# Patient Record
Sex: Female | Born: 1935 | Race: White | Hispanic: No | Smoking: Never smoker
Health system: Southern US, Community
[De-identification: ages and names within clinical notes are randomized; demographics above are authoritative.]

## PROBLEM LIST (undated history)

## (undated) DIAGNOSIS — Z9221 Personal history of antineoplastic chemotherapy: Secondary | ICD-10-CM

## (undated) DIAGNOSIS — E079 Disorder of thyroid, unspecified: Secondary | ICD-10-CM

## (undated) DIAGNOSIS — C801 Malignant (primary) neoplasm, unspecified: Secondary | ICD-10-CM

## (undated) DIAGNOSIS — Z974 Presence of external hearing-aid: Secondary | ICD-10-CM

## (undated) DIAGNOSIS — Z923 Personal history of irradiation: Secondary | ICD-10-CM

## (undated) DIAGNOSIS — E039 Hypothyroidism, unspecified: Secondary | ICD-10-CM

## (undated) HISTORY — DX: Disorder of thyroid, unspecified: E07.9

## (undated) HISTORY — PX: EYE SURGERY: SHX253

## (undated) HISTORY — PX: ELBOW FRACTURE SURGERY: SHX616

## (undated) HISTORY — PX: ABDOMINAL EXPLORATION SURGERY: SHX538

---

## 2004-05-27 ENCOUNTER — Ambulatory Visit: Payer: Self-pay | Admitting: Internal Medicine

## 2004-06-27 ENCOUNTER — Ambulatory Visit: Payer: Self-pay | Admitting: Internal Medicine

## 2004-07-21 ENCOUNTER — Ambulatory Visit: Payer: Self-pay | Admitting: Internal Medicine

## 2004-07-27 ENCOUNTER — Ambulatory Visit: Payer: Self-pay | Admitting: Internal Medicine

## 2004-09-07 ENCOUNTER — Ambulatory Visit: Payer: Self-pay | Admitting: Internal Medicine

## 2004-09-07 IMAGING — CT CT CHEST-ABD-PELV W/ CM
1 of 3 series · 14 of 30 positions shown, 18 images · non-contrast
Comparison: none

REASON FOR EXAM: abdominal lymphoma
COMMENTS:

[Series 3: inspace · axial · 0.66mm/px · z∈[-454,+98]mm · 14 of 612 slices shown, 18 images]
[im 30/612  mediastinal]
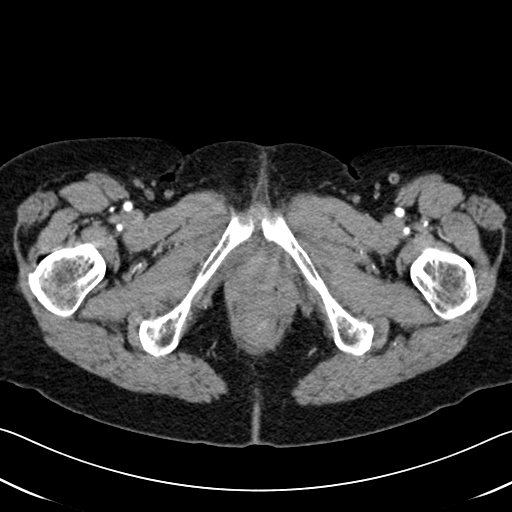
[im 30/612  bone]
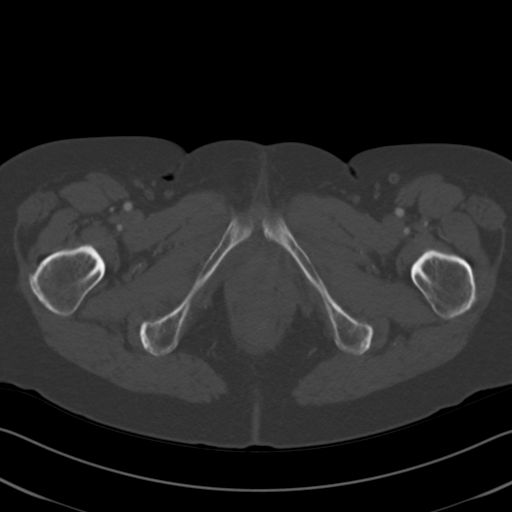
[im 88/612  mediastinal]
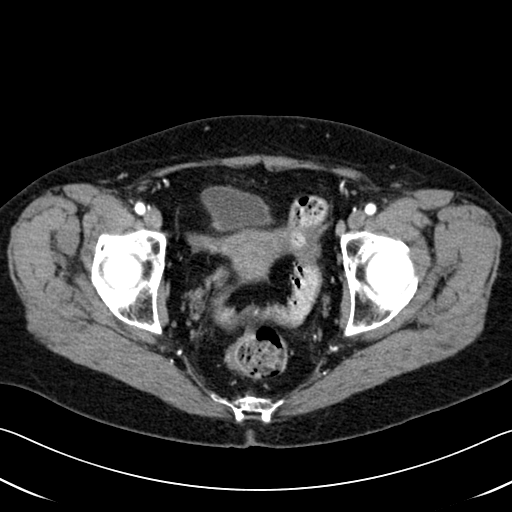
[im 146/612  mediastinal]
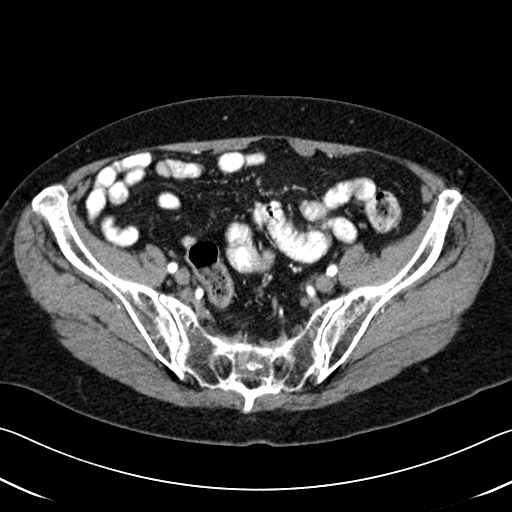
[im 204/612  mediastinal]
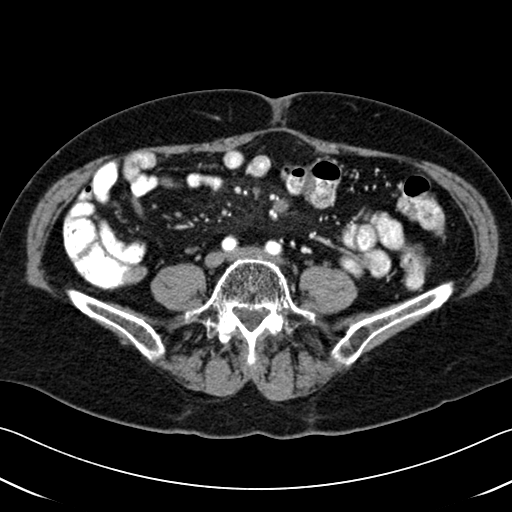
[im 233/612  mediastinal]
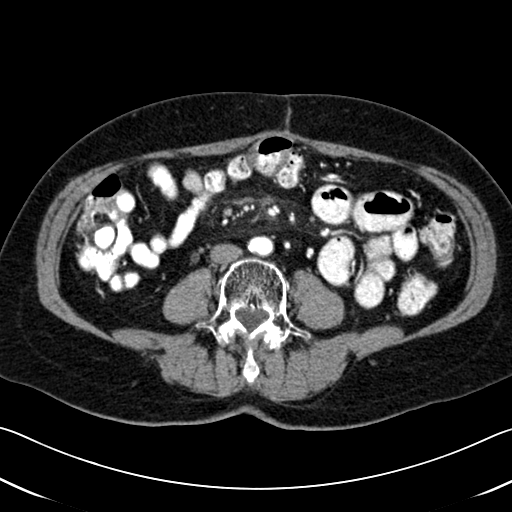
[im 291/612  mediastinal]
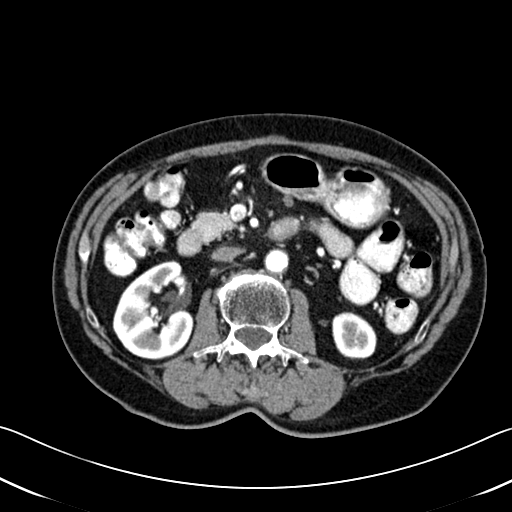
[im 321/612  mediastinal]
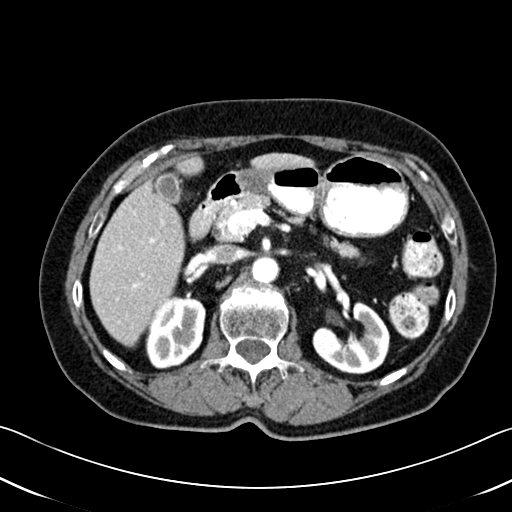
[im 379/612  mediastinal]
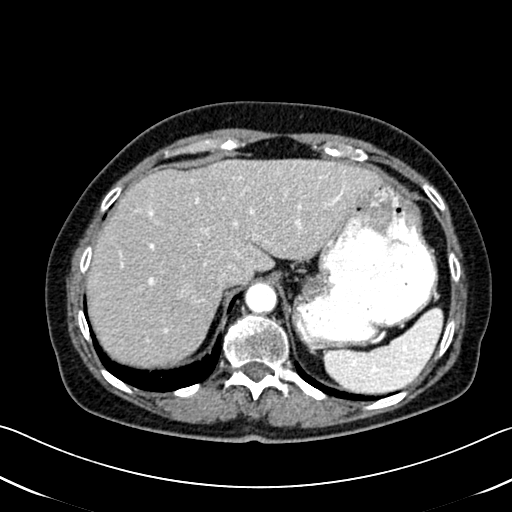
[im 408/612  mediastinal]
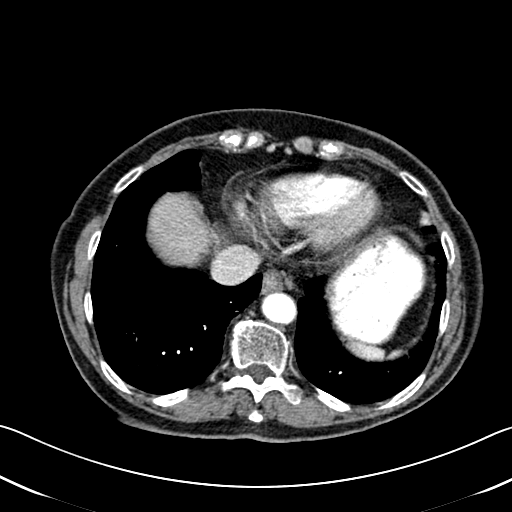
[im 408/612  bone]
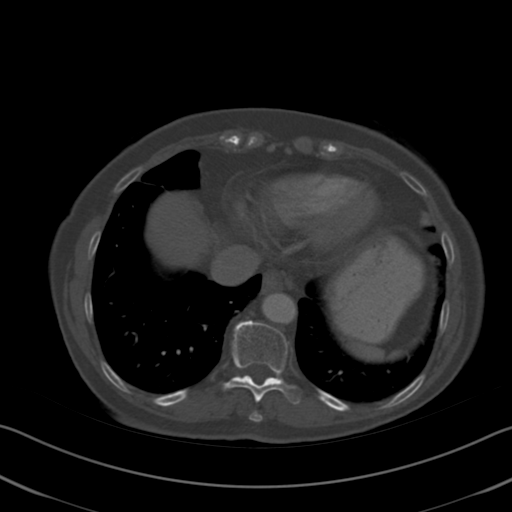
[im 466/612  mediastinal]
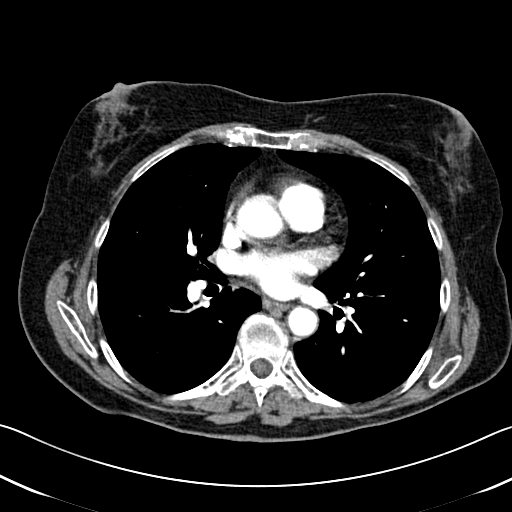
[im 495/612  lung]
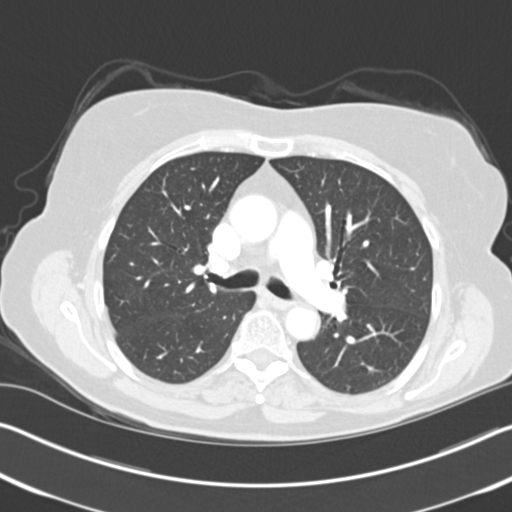
[im 524/612  mediastinal]
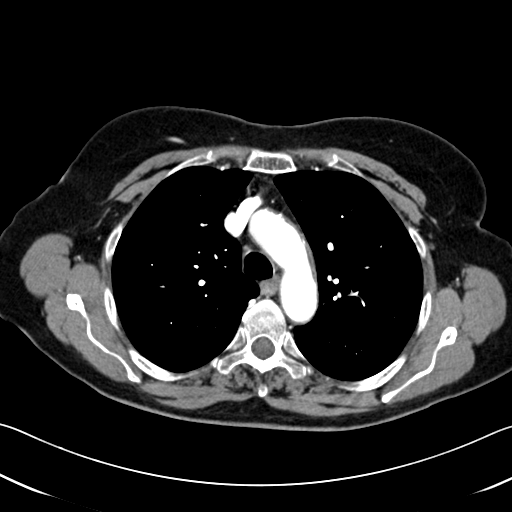
[im 524/612  lung]
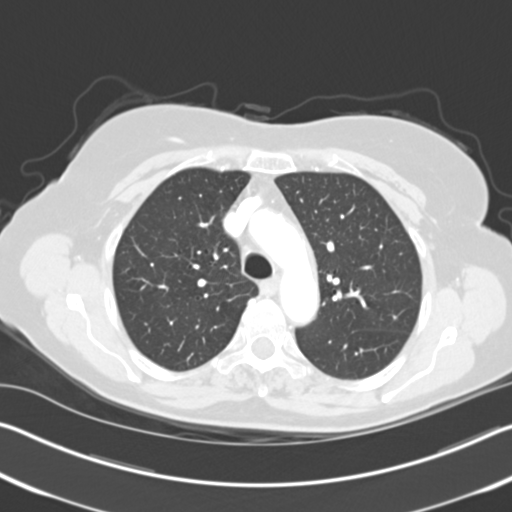
[im 553/612  lung]
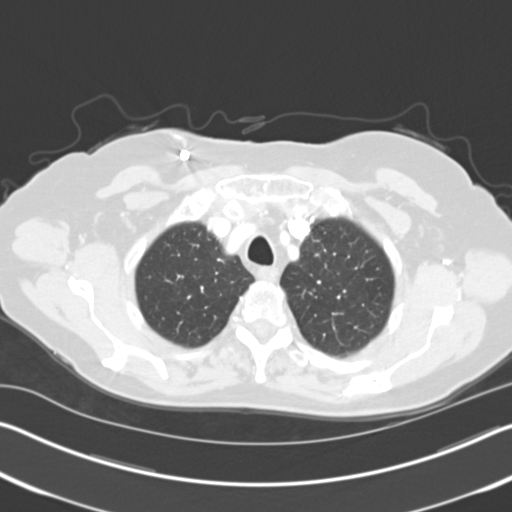
[im 582/612  mediastinal]
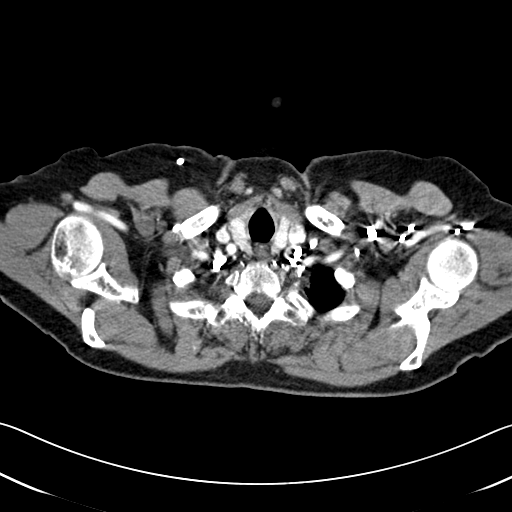
[im 582/612  lung]
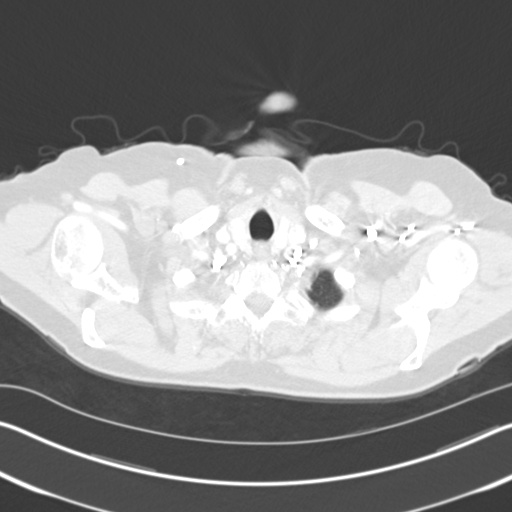

[14 of 30 positions shown; findings below may reference images not displayed]

PROCEDURE:     CT  - CT CHEST ABDOMEN AND PELVIS W  - [DATE] [DATE]

RESULT:        Comparison is made to a study dated [DATE].

The patient received 100 cc of [ND] for this study.  Within the
axillary regions, I see no lymphadenopathy.   The retrosternal soft tissues
are normal in appearance.  I see no pathologic size mediastinal nor hilar
lymph nodes.  The cardiac chambers are not enlarged.  The caliber of the
thoracic aorta is normal.  I see no pleural nor pericardial effusion.  There
is a subtle area of soft tissue density in the RIGHT cardiophrenic angle
seen best on image #38 of the lung windows and #37 of the mediastinal
windows that appears new and may reflect an area of subsegmental
atelectasis.  The lung parenchyma otherwise exhibits no acute abnormality.

CT ABDOMEN AND PELVIS

The patient received the aforementioned IV contrast as well as oral
contrast.

The liver, partially distended stomach, pancreas, adrenal glands,
gallbladder and spleen are normal in appearance.  The pancreatic duct is
visible which may indicate some atrophy but this not a new finding.  No
pancreatic mass is identified.  The para-aortic and pericaval regions
exhibit no evidence of lymphadenopathy.   The kidneys enhance well and
exhibit no hydronephrosis.  The partially contrast filled loops of small and
large bowel are grossly normal.  I see no free fluid in the pelvis.  The
uterus and adnexal structures appear normal for age.  The partially
distended urinary bladder is normal in appearance.  On the prior study,
comment was made regarding the slightly increased density seen in the
mesenteric fat inferior to the pancreas.   This is again seen but appears
unchanged.
IMPRESSION: 1.     I see no mediastinal or hilar lymphadenopathy.
2.     There is patchy density extremely anteriorly and medially in the
RIGHT middle lobe near the cardiophrenic angle that is new and may reflect
an area of subsegmental atelectasis.  This could be monitored with chest
x-rays.
3.     I do not see findings suspicious for active lymphoma within the
abdomen or pelvis.  No other acute intra-abdominal abnormality is
identified.

## 2004-09-13 ENCOUNTER — Ambulatory Visit: Payer: Self-pay | Admitting: Surgery

## 2004-09-27 ENCOUNTER — Ambulatory Visit: Payer: Self-pay | Admitting: Internal Medicine

## 2004-11-01 ENCOUNTER — Ambulatory Visit: Payer: Self-pay | Admitting: Internal Medicine

## 2004-11-25 ENCOUNTER — Ambulatory Visit: Payer: Self-pay | Admitting: Internal Medicine

## 2005-01-10 ENCOUNTER — Ambulatory Visit: Payer: Self-pay | Admitting: Radiation Oncology

## 2005-01-15 IMAGING — CT CT CHEST-ABD-PELV W/ CM
1 of 2 series · 14 of 29 positions shown, 19 images · non-contrast
Comparison: none

REASON FOR EXAM: Lymphoma followup
COMMENTS:

[Series 2: soft tissue · axial · 0.62mm/px · z∈[-656,-130]mm · 14 of 119 slices shown, 19 images]
[im 7/119  mediastinal]
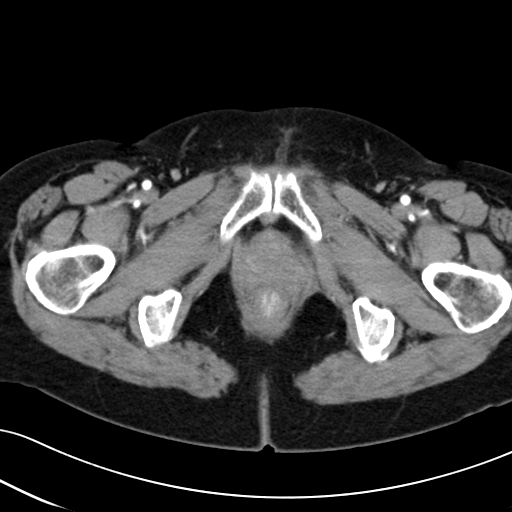
[im 7/119  bone]
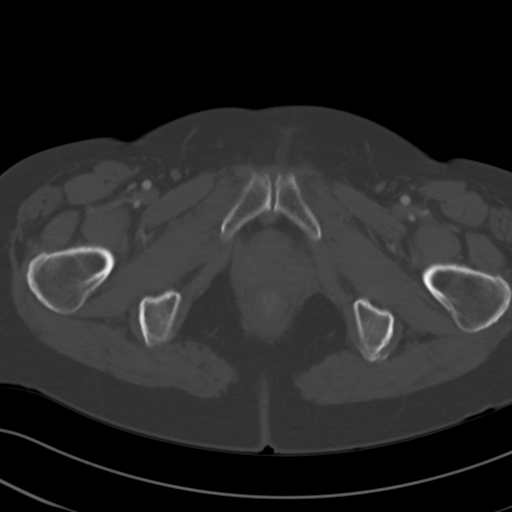
[im 20/119  mediastinal]
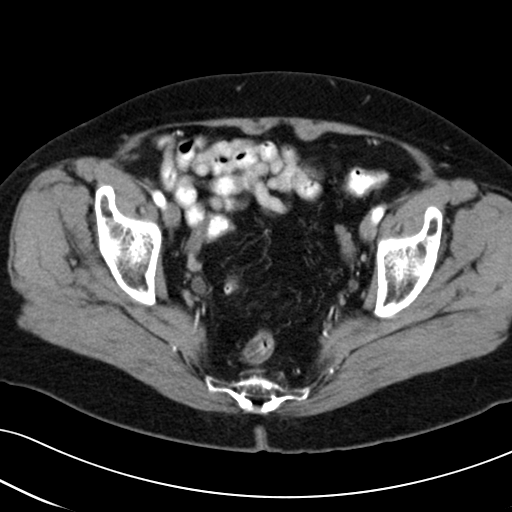
[im 27/119  mediastinal]
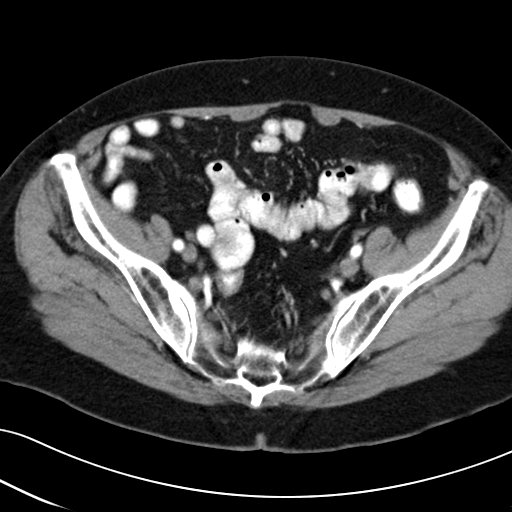
[im 33/119  mediastinal]
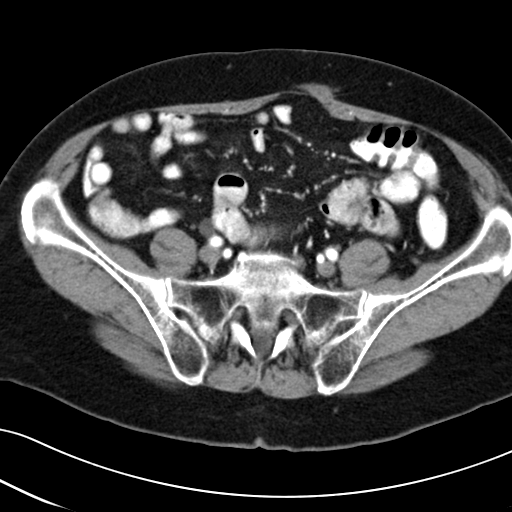
[im 46/119  mediastinal]
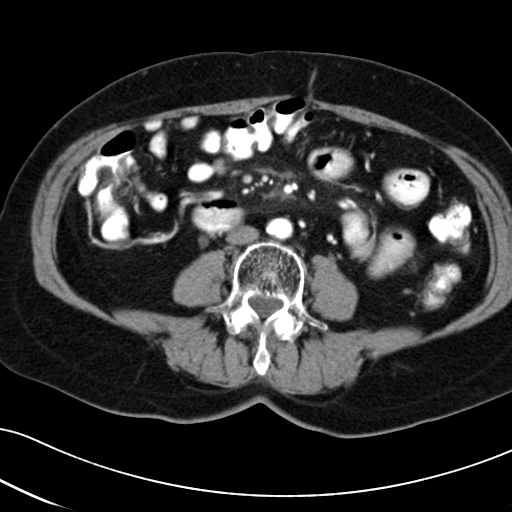
[im 53/119  mediastinal]
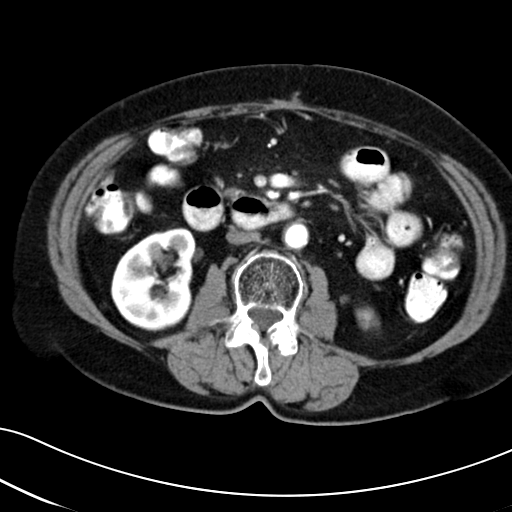
[im 60/119  mediastinal]
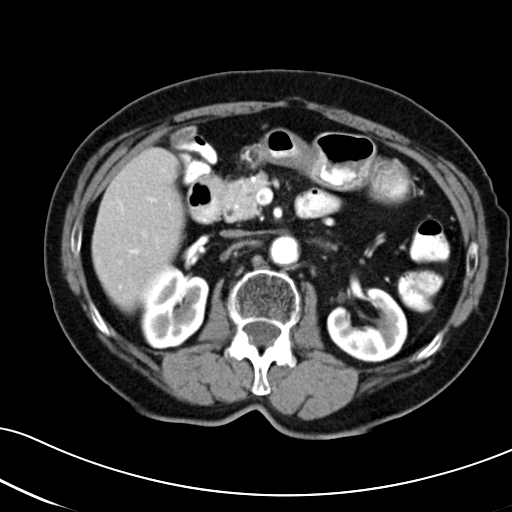
[im 66/119  mediastinal]
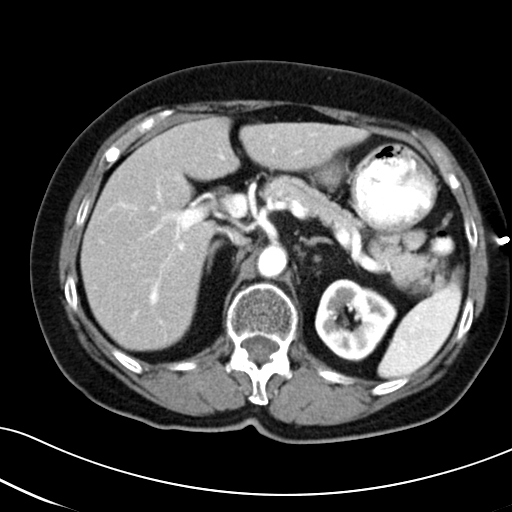
[im 73/119  mediastinal]
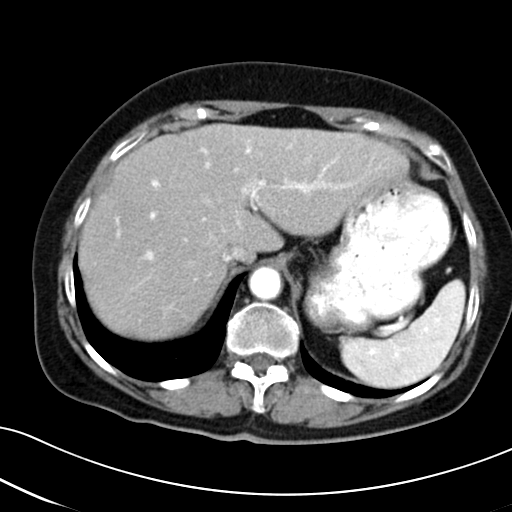
[im 73/119  bone]
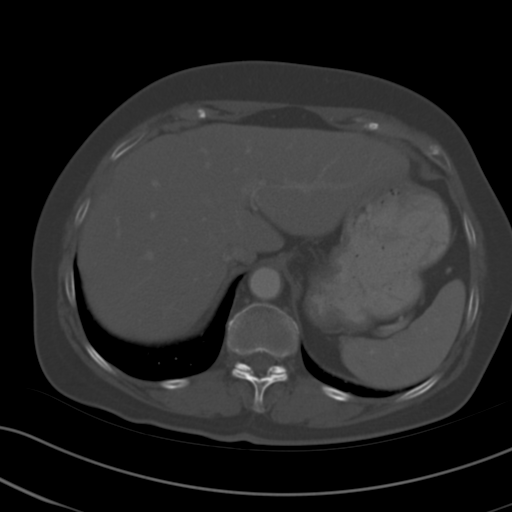
[im 86/119  mediastinal]
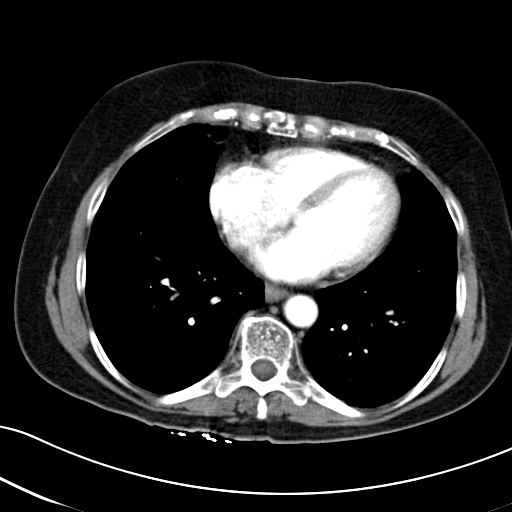
[im 92/119  mediastinal]
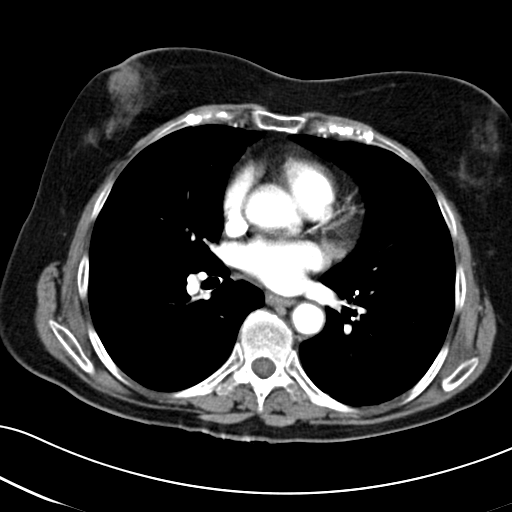
[im 92/119  lung]
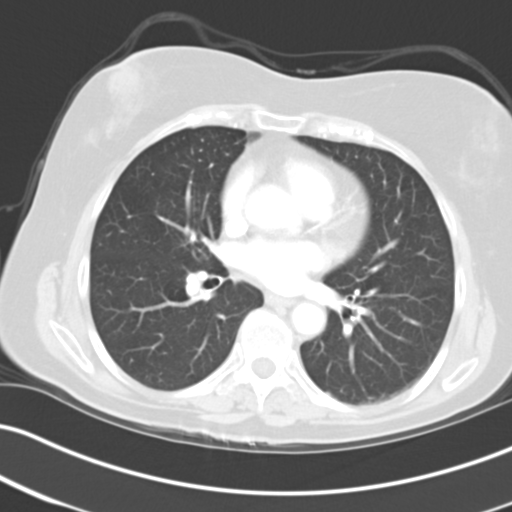
[im 99/119  mediastinal]
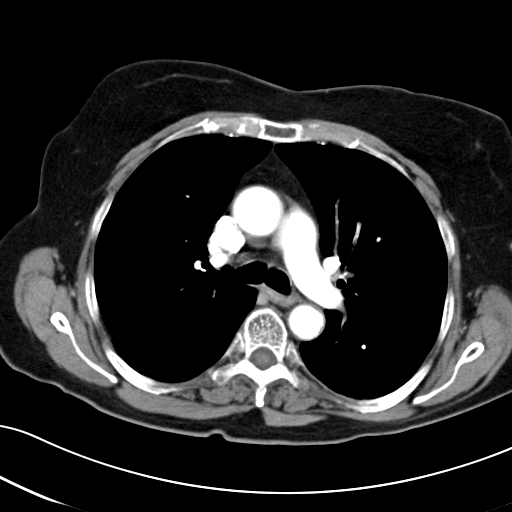
[im 99/119  lung]
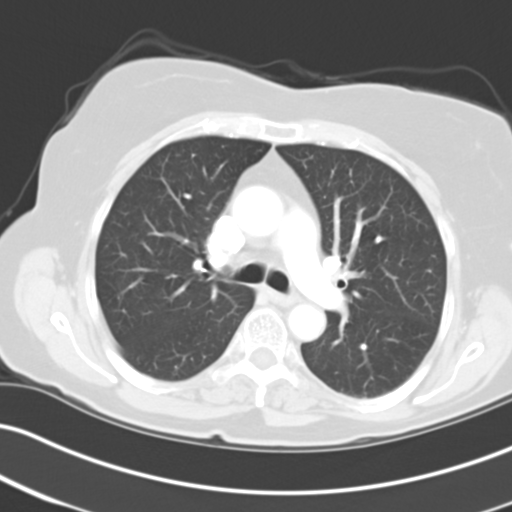
[im 105/119  lung]
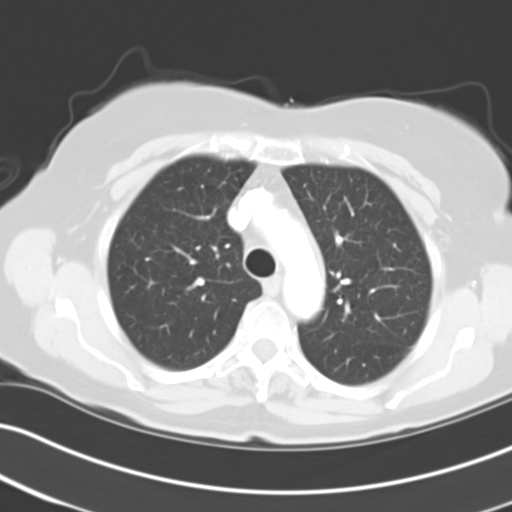
[im 112/119  mediastinal]
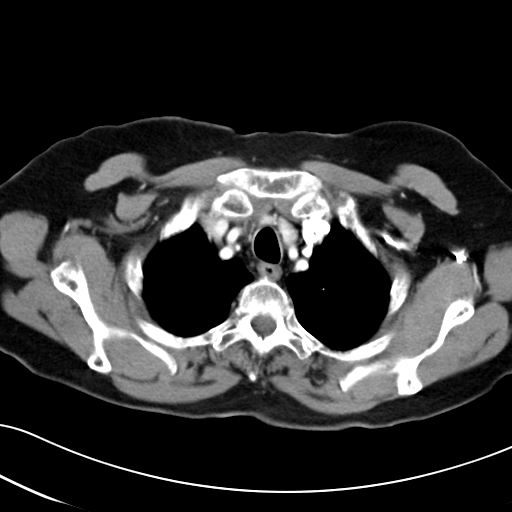
[im 112/119  lung]
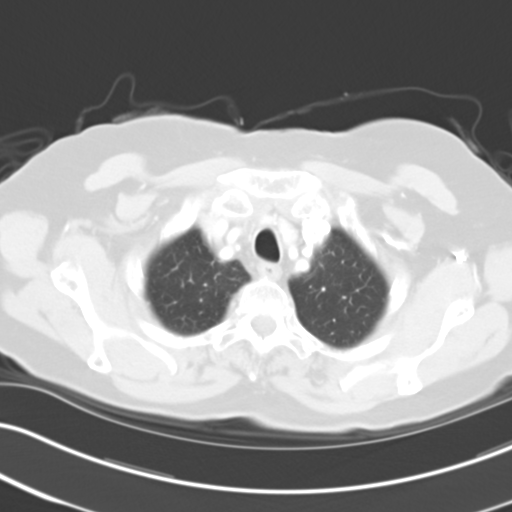

[14 of 29 positions shown; findings below may reference images not displayed]

PROCEDURE:     CT  - CT CHEST ABDOMEN AND PELVIS W  - [DATE]  [DATE]

RESULT:     Comparison is made to the prior CT scan performed on
[DATE].     There is no evidence of mediastinal or hilar mass or
adenopathy.  No axillary masses or areas of adenopathy are present.  The
heart is not enlarged.  There is no pleural effusion.  There is no evidence
of focal pulmonary parenchymal mass or nodularity.  Some linear density is
seen in the RIGHT middle lobe near the lung base suggestive of subsegmental
atelectasis or fibrosis.

Within the abdomen the spleen shows no focal masses or evidence of
enlargement.  The liver shows normal attenuation and enhancement.  There is
no evidence of adenopathy.  The abdominal aorta is normal in caliber.  No
retroperitoneal adenopathy is seen.  The kidneys are normal in size and
enhancement.  The pancreas is normal.  The gallbladder is nondistended.  No
abnormal bowel distention or bowel wall thickening is present.  No inguinal
or pelvic adenopathy is seen.
IMPRESSION: No evidence of adenopathy to suggest residual or recurrent disease.

Minimal linear density in the RIGHT middle lobe suggestive of atelectasis
versus fibrosis.

## 2005-01-25 ENCOUNTER — Ambulatory Visit: Payer: Self-pay | Admitting: Radiation Oncology

## 2005-02-24 ENCOUNTER — Ambulatory Visit: Payer: Self-pay | Admitting: Radiation Oncology

## 2005-05-30 ENCOUNTER — Ambulatory Visit: Payer: Self-pay | Admitting: Internal Medicine

## 2005-06-27 ENCOUNTER — Ambulatory Visit: Payer: Self-pay | Admitting: Internal Medicine

## 2005-07-24 IMAGING — CT CT CHEST-ABD-PELV W/ CM
1 of 2 series · 14 of 29 positions shown, 19 images · non-contrast
Comparison: none

REASON FOR EXAM: History of lymphoma, assess for recurrence
COMMENTS:

[Series 2: soft tissue · axial · 0.68mm/px · z∈[-94,+430]mm · 14 of 119 slices shown, 19 images]
[im 7/119  mediastinal]
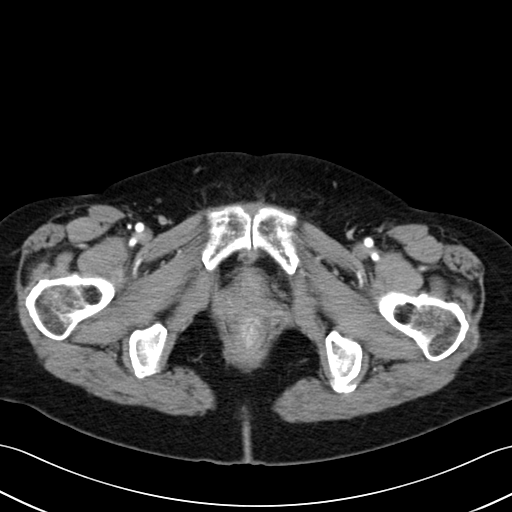
[im 7/119  bone]
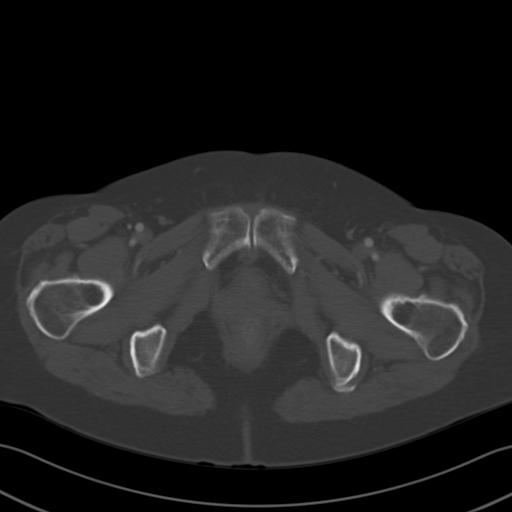
[im 20/119  mediastinal]
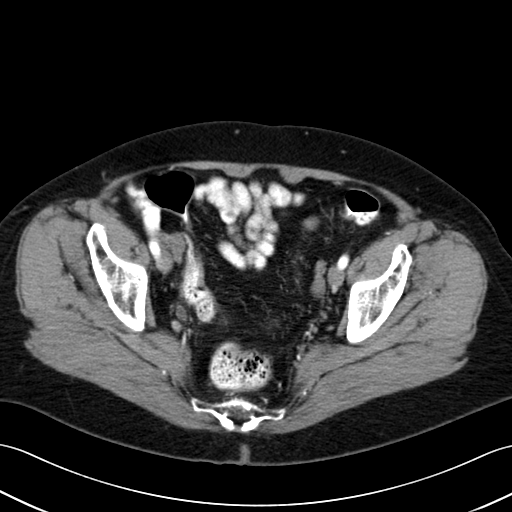
[im 27/119  mediastinal]
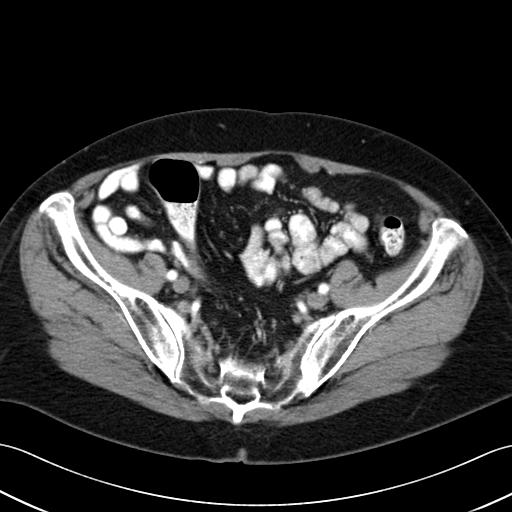
[im 33/119  mediastinal]
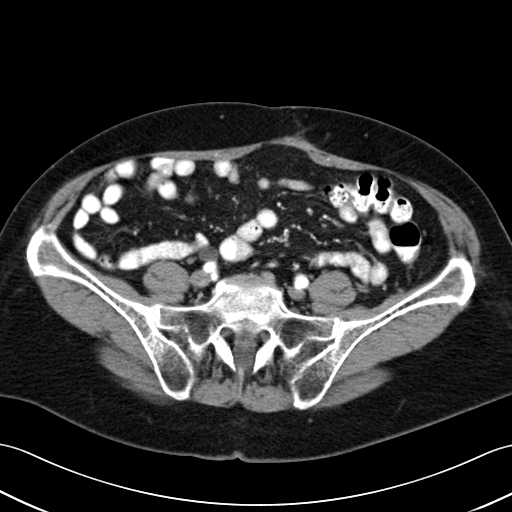
[im 46/119  mediastinal]
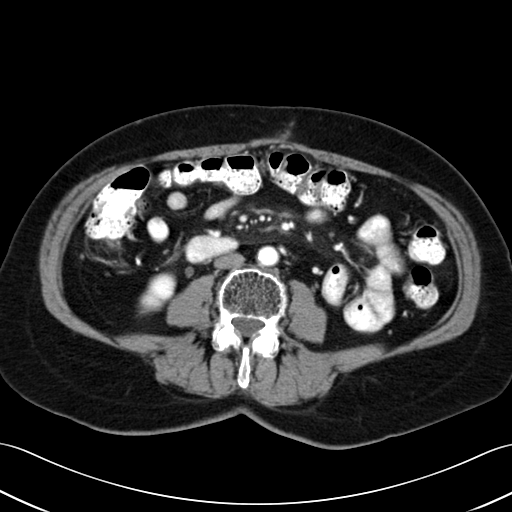
[im 53/119  mediastinal]
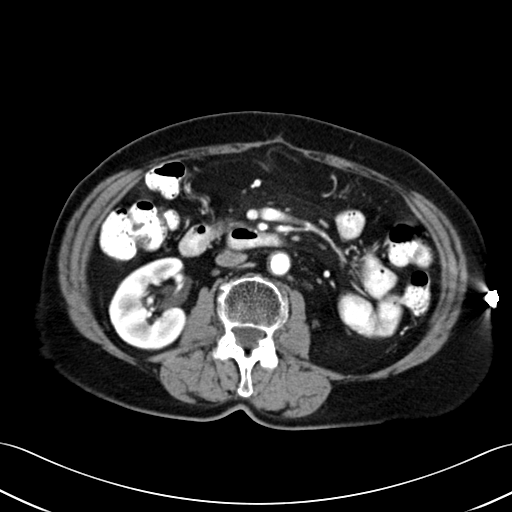
[im 60/119  mediastinal]
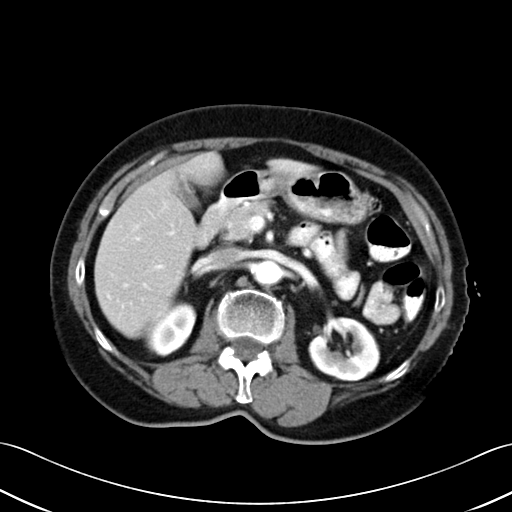
[im 66/119  mediastinal]
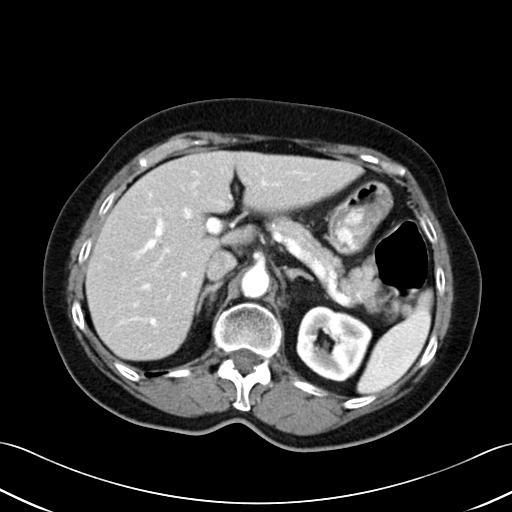
[im 73/119  mediastinal]
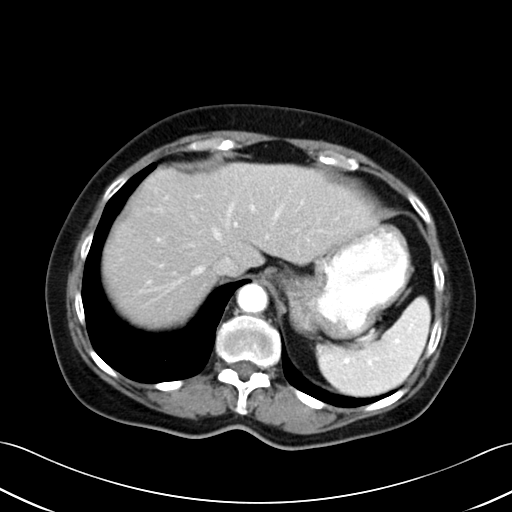
[im 73/119  bone]
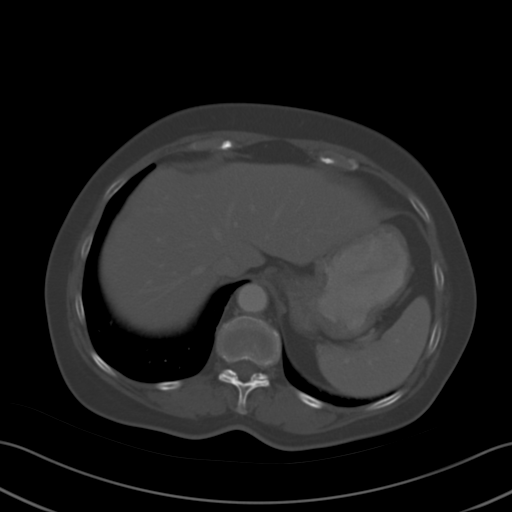
[im 86/119  mediastinal]
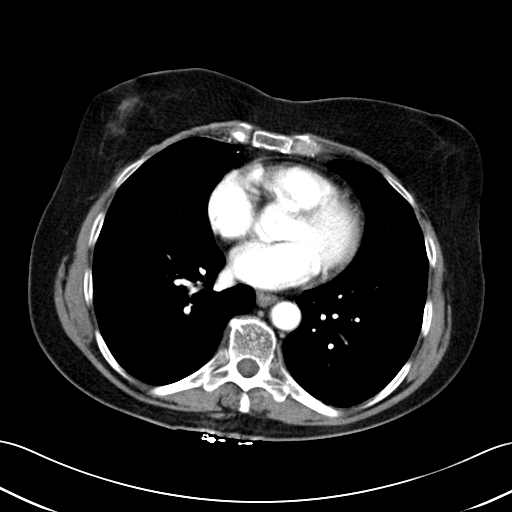
[im 92/119  mediastinal]
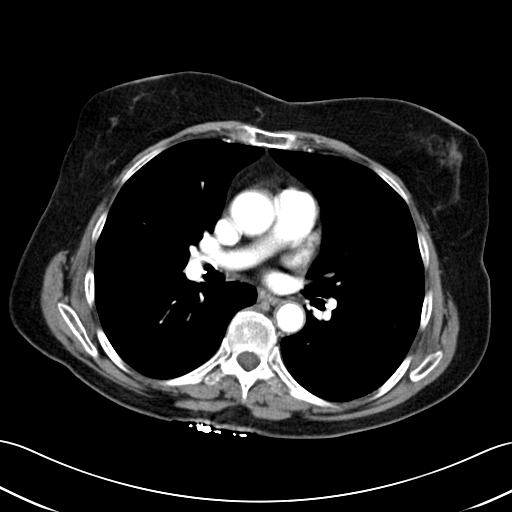
[im 92/119  lung]
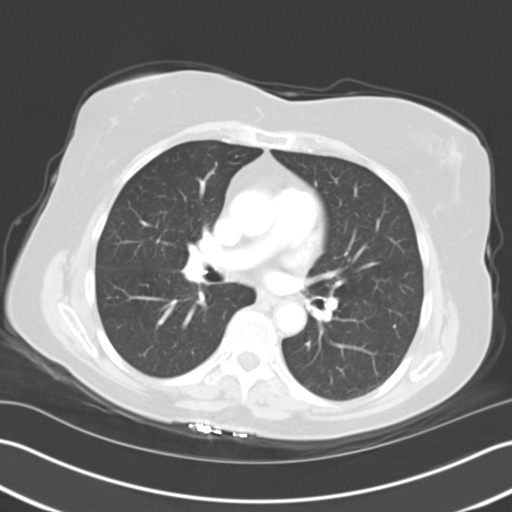
[im 99/119  mediastinal]
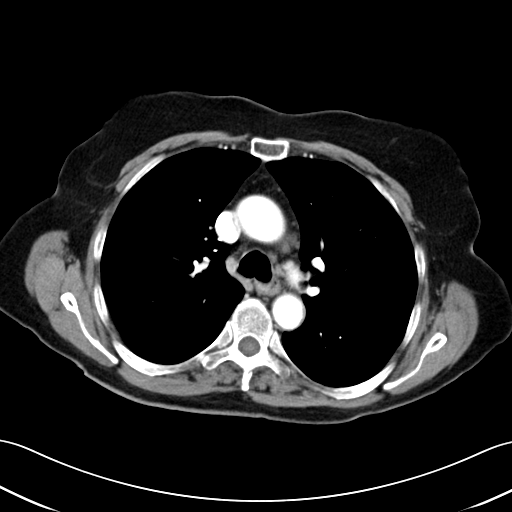
[im 99/119  lung]
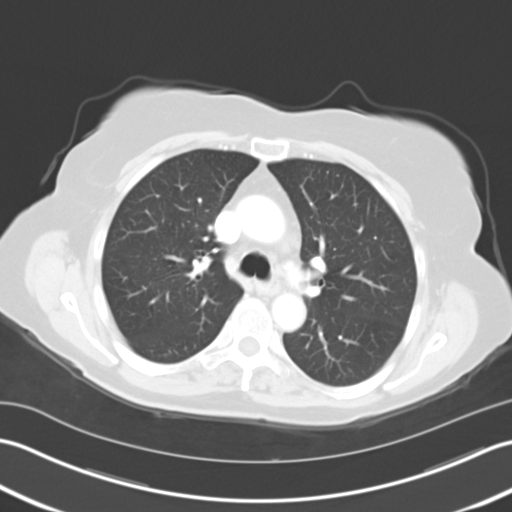
[im 105/119  lung]
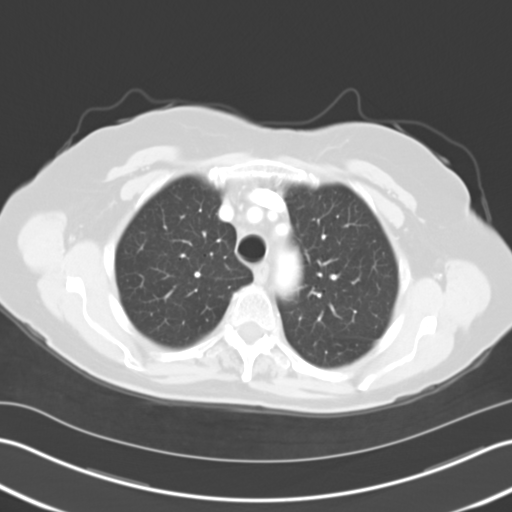
[im 112/119  mediastinal]
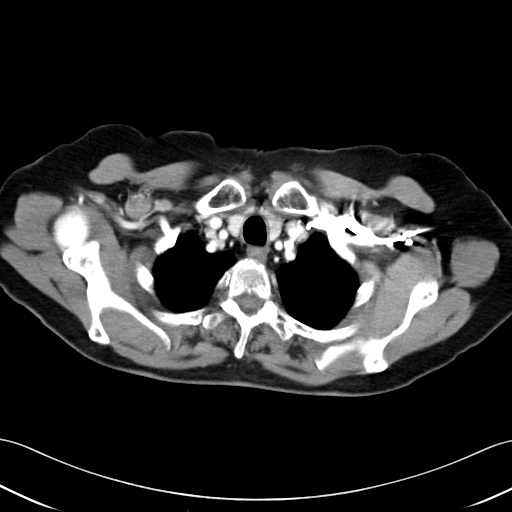
[im 112/119  lung]
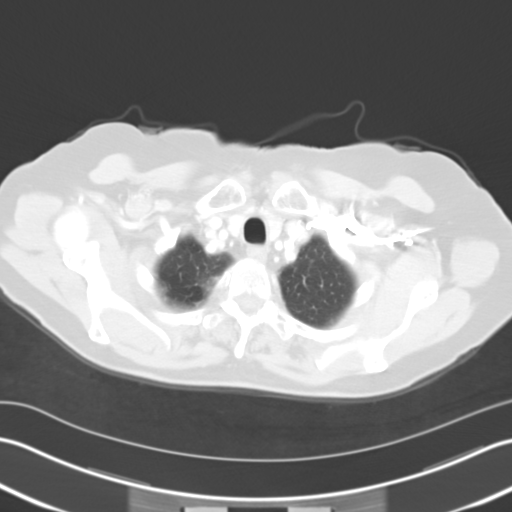

[14 of 29 positions shown; findings below may reference images not displayed]

PROCEDURE:     CT  - CT CHEST ABDOMEN AND PELVIS W  - [DATE] [DATE]

RESULT:     Axial images were obtained from the apices to the pubic
symphysis post intravenous and oral administration of contrast material.
The present study is compared with prior exam of [DATE].

CHEST CT:  There are a few tiny lymph nodes within the aorticopulmonary
window as well as in the pretracheal area.  No hilar and/or subcarinal
adenopathy is noted.  On the lung window settings there appears some linear
fibrosis in the middle and lingular regions.  No effusions are seen.  No
nodularity in the lung fields.

ABDOMINAL/PELVIC CT:  The liver and spleen appear intact.  No organomegaly
is noted.  Both kidneys excrete the contrast material.  No masses are
identified in either kidney.  No hydronephrosis is seen.  No retroperitoneal
adenopathy is seen.  No mesenteric adenopathy is noted.  Within the pelvis
no obvious adenopathy is noted.  No fluid is noted in the abdomen and/or
pelvis.
IMPRESSION: No definite evidence of recurrence of lymphoma on the chest CT.

No definite evidence of recurrent lymphoma identified in the abdomen or
pelvis.

## 2005-07-27 ENCOUNTER — Ambulatory Visit: Payer: Self-pay | Admitting: Internal Medicine

## 2005-08-23 ENCOUNTER — Ambulatory Visit: Payer: Self-pay | Admitting: Internal Medicine

## 2005-11-26 ENCOUNTER — Ambulatory Visit: Payer: Self-pay | Admitting: Internal Medicine

## 2005-12-25 ENCOUNTER — Ambulatory Visit: Payer: Self-pay | Admitting: Internal Medicine

## 2006-03-12 ENCOUNTER — Ambulatory Visit: Payer: Self-pay | Admitting: Internal Medicine

## 2006-03-12 IMAGING — CT CT CHEST-ABD-PELV W/ CM
1 of 2 series · 14 of 29 positions shown, 19 images · IV contrast (agent unspecified)
Comparison: none

REASON FOR EXAM: Stage II lymphoma, status post treatment, evaluate for
recurrence
COMMENTS:

PROCEDURE:     CT  - CT CHEST ABDOMEN AND PELVIS W  - [DATE] [DATE]
RESULT:     The patient has a history of lymphoma, evaluate for recurrence.
TECHNIQUE: CT scan of chest, abdomen and pelvis is performed with contrast.

[Series 2: soft tissue · axial · 0.74mm/px · z∈[-36,+484]mm · 14 of 118 slices shown, 19 images]
[im 7/118  mediastinal]
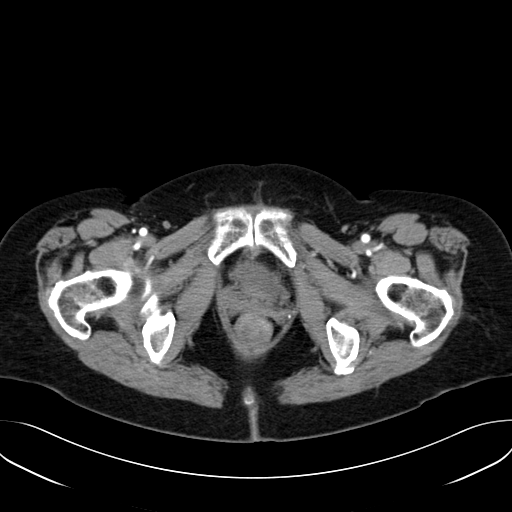
[im 7/118  bone]
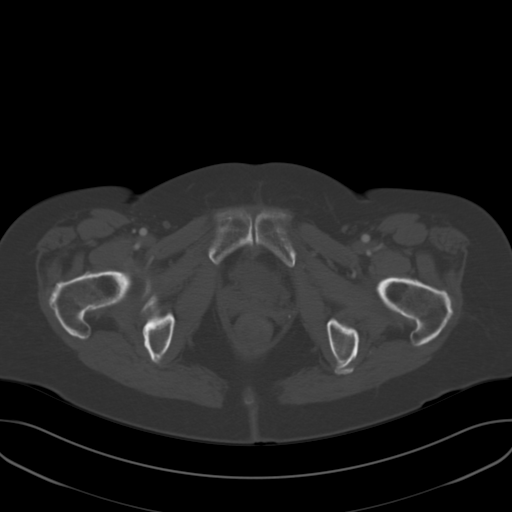
[im 20/118  mediastinal]
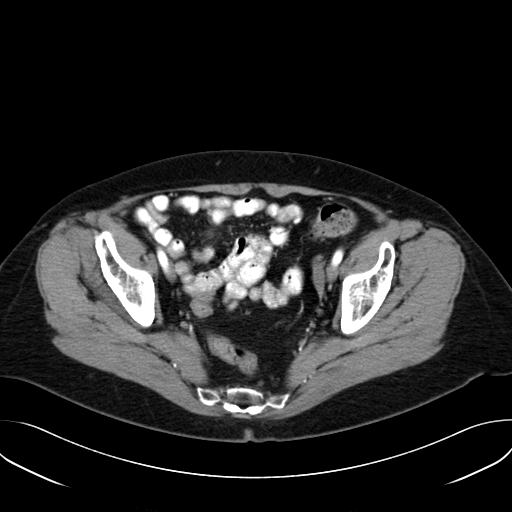
[im 27/118  mediastinal]
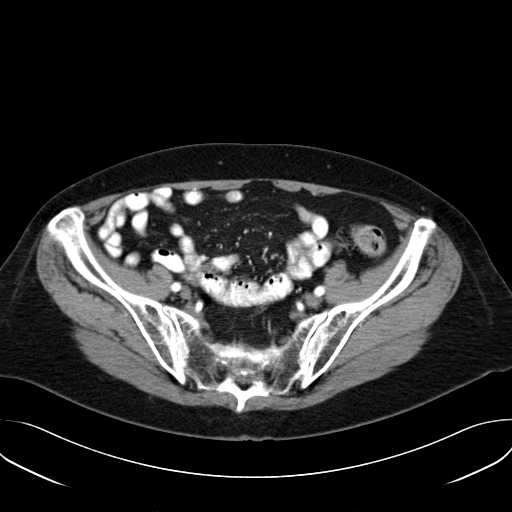
[im 33/118  mediastinal]
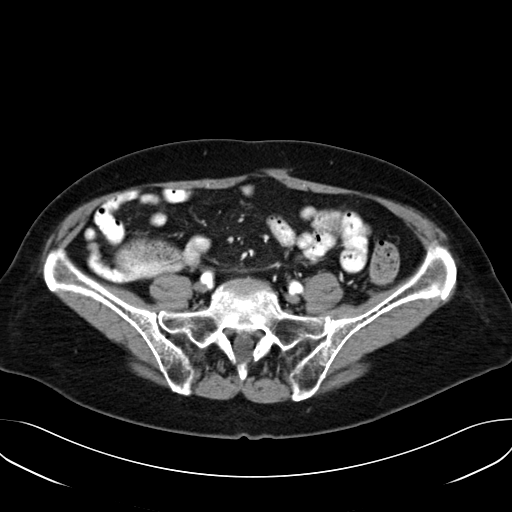
[im 46/118  mediastinal]
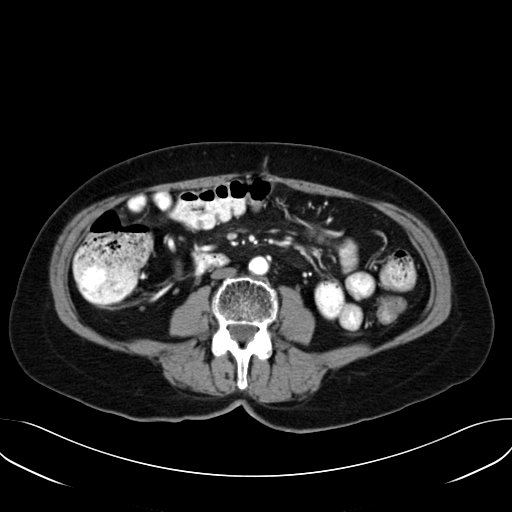
[im 53/118  mediastinal]
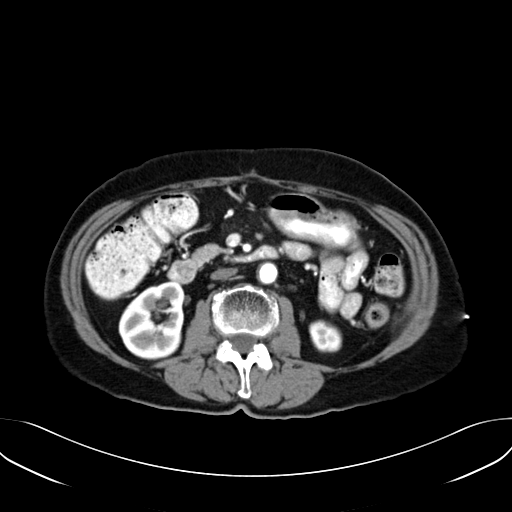
[im 59/118  mediastinal]
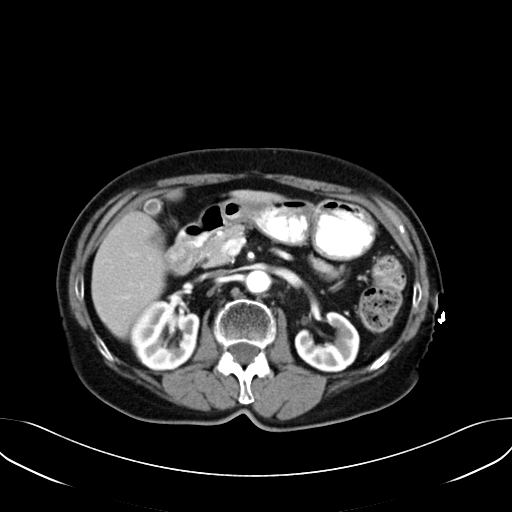
[im 66/118  mediastinal]
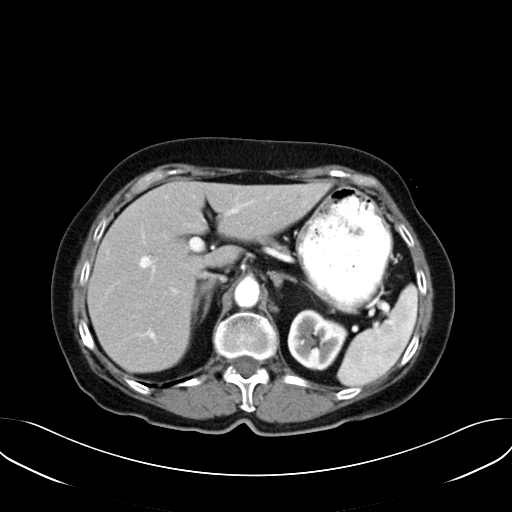
[im 72/118  mediastinal]
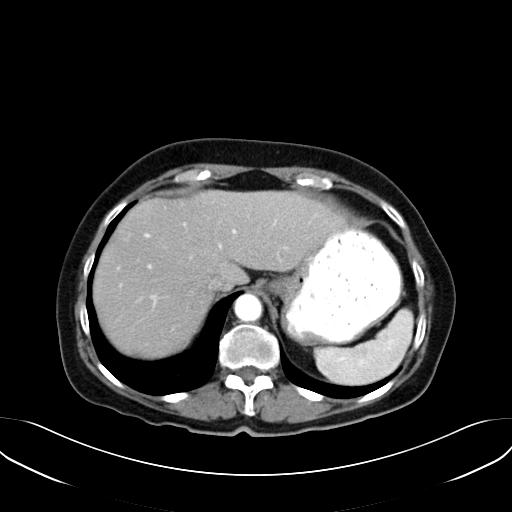
[im 72/118  bone]
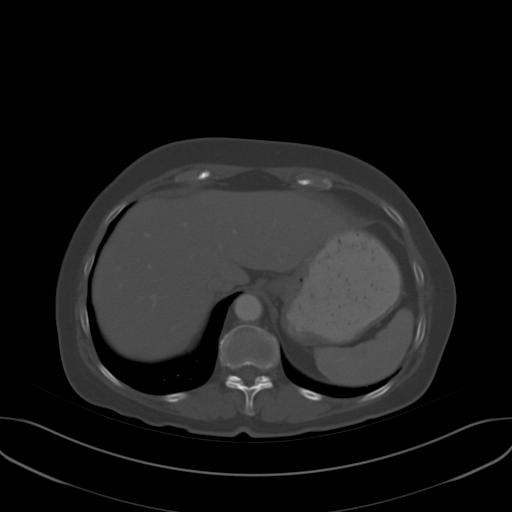
[im 85/118  mediastinal]
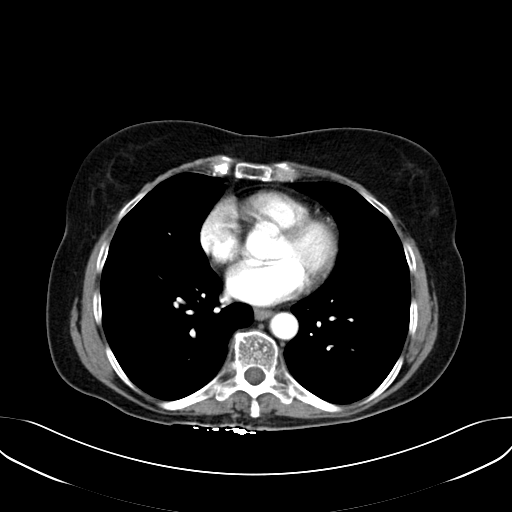
[im 92/118  mediastinal]
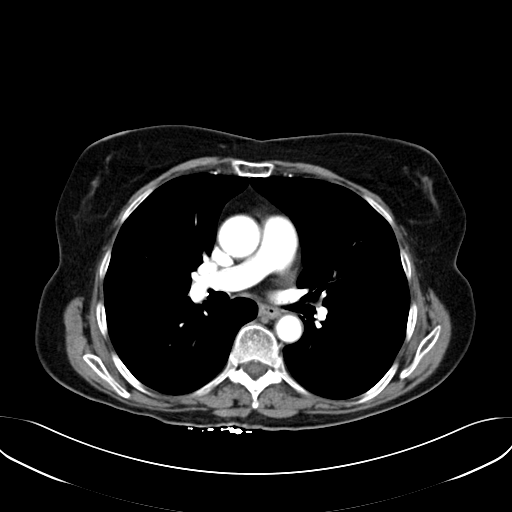
[im 92/118  lung]
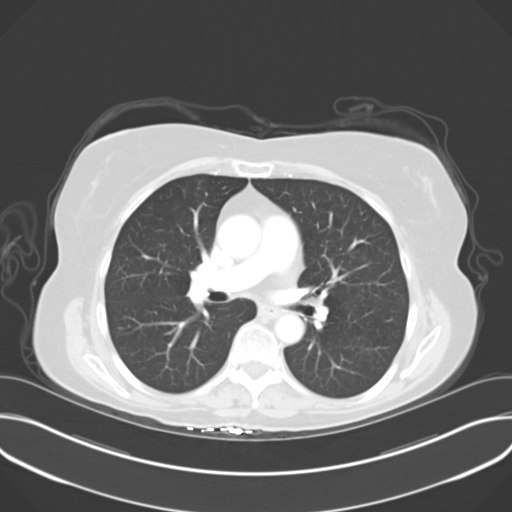
[im 98/118  mediastinal]
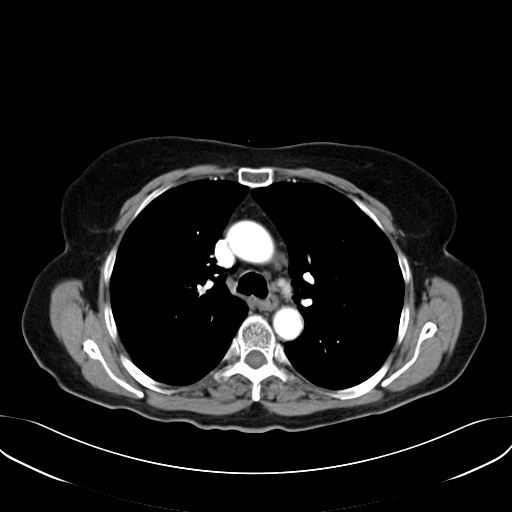
[im 98/118  lung]
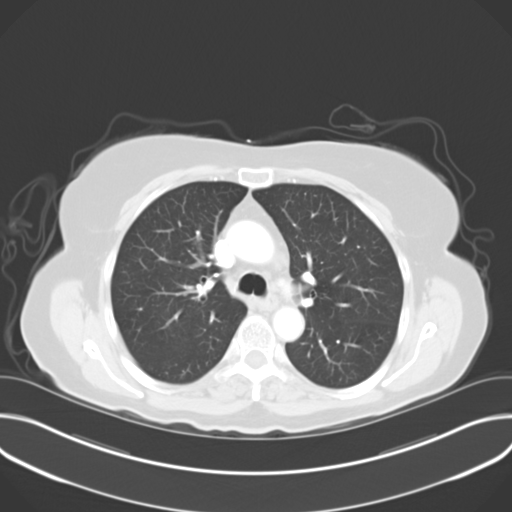
[im 105/118  lung]
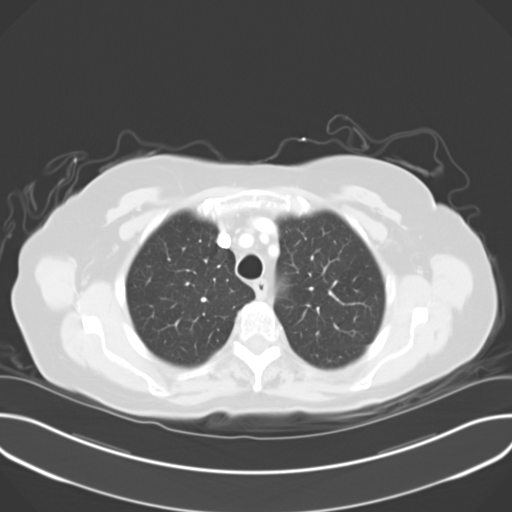
[im 111/118  mediastinal]
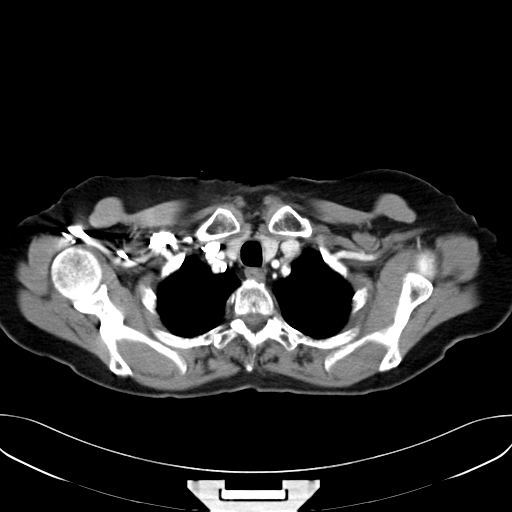
[im 111/118  lung]
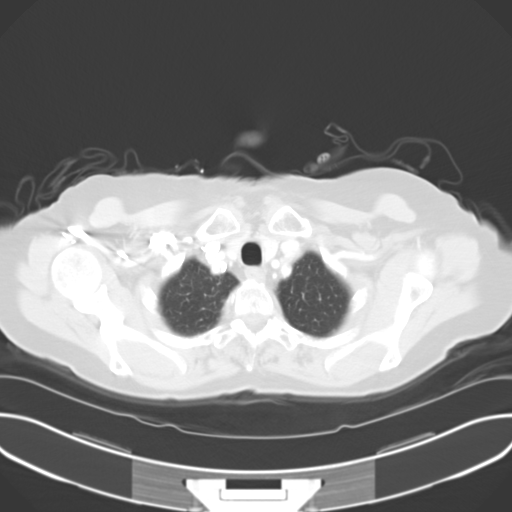

[14 of 29 positions shown; findings below may reference images not displayed]

FINDINGS: The mediastinum and hilar structures are normal. The large airways
are patent. No focal infiltrates are noted. No evidence of pulmonary
metastatic lesions is noted. The pulmonary arteries are normal. The adrenals
are normal. The liver and spleen are normal. No retroperitoneal adenopathy
is noted. The bowel is nondistended. Aortoiliac atherosclerotic vascular
disease is present. Small, inguinal lymph nodes are noted.
IMPRESSION: The CT of chest, abdomen and pelvis is stable from prior study
of [DATE]. There is no evidence of significant lymphadenopathy. PET/CT
can be obtained to demonstrated stability as well.

## 2006-03-27 ENCOUNTER — Ambulatory Visit: Payer: Self-pay | Admitting: Internal Medicine

## 2006-05-27 ENCOUNTER — Ambulatory Visit: Payer: Self-pay | Admitting: Internal Medicine

## 2006-06-27 ENCOUNTER — Ambulatory Visit: Payer: Self-pay | Admitting: Internal Medicine

## 2006-08-29 ENCOUNTER — Ambulatory Visit: Payer: Self-pay | Admitting: Internal Medicine

## 2006-11-22 ENCOUNTER — Ambulatory Visit: Payer: Self-pay | Admitting: Internal Medicine

## 2006-11-26 ENCOUNTER — Ambulatory Visit: Payer: Self-pay | Admitting: Internal Medicine

## 2007-02-25 ENCOUNTER — Ambulatory Visit: Payer: Self-pay | Admitting: Internal Medicine

## 2007-03-17 ENCOUNTER — Ambulatory Visit: Payer: Self-pay | Admitting: Internal Medicine

## 2007-03-17 IMAGING — CT CT CHEST-ABD-PELV W/ CM
1 of 2 series · 14 of 32 positions shown, 19 images · non-contrast
Comparison: none

REASON FOR EXAM: Lymphoma
COMMENTS:

[Series 2: soft tissue · axial · 0.71mm/px · z∈[-300,+240]mm · 14 of 120 slices shown, 19 images]
[im 6/120  soft-tissue]
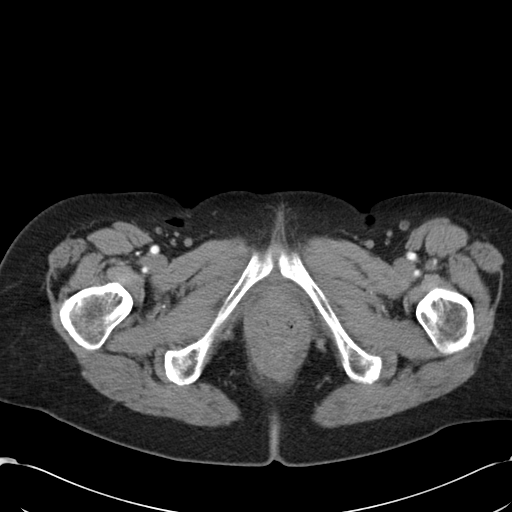
[im 6/120  bone]
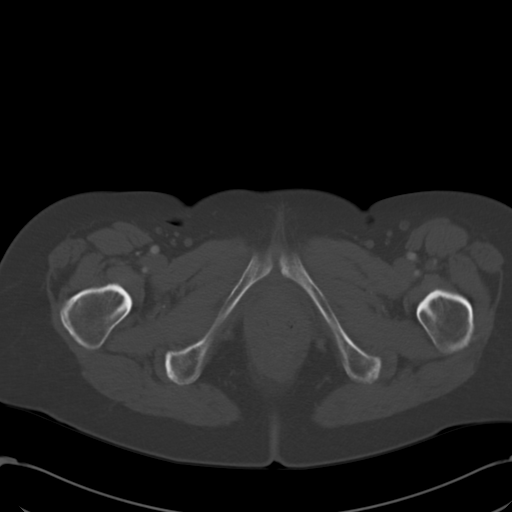
[im 18/120  soft-tissue]
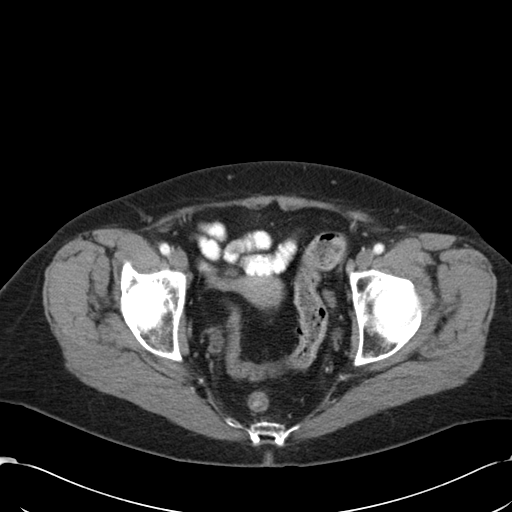
[im 23/120  soft-tissue]
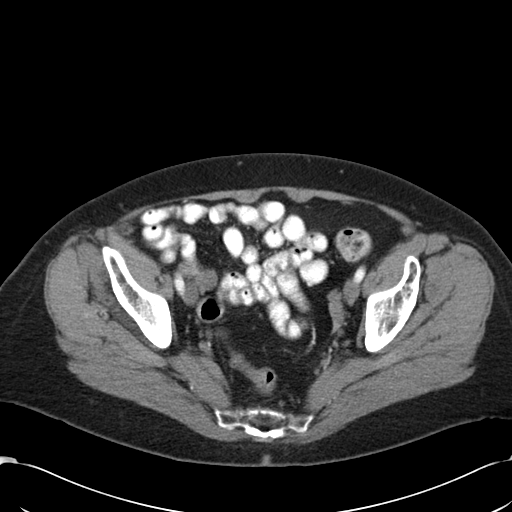
[im 35/120  soft-tissue]
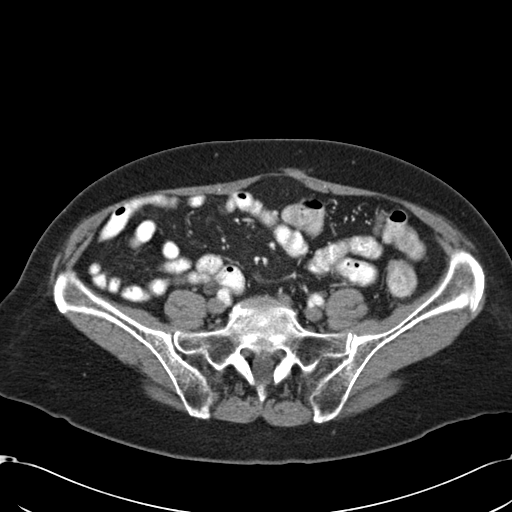
[im 40/120  soft-tissue]
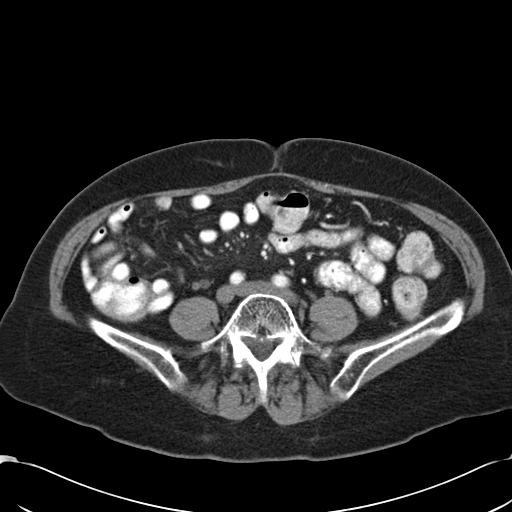
[im 52/120  soft-tissue]
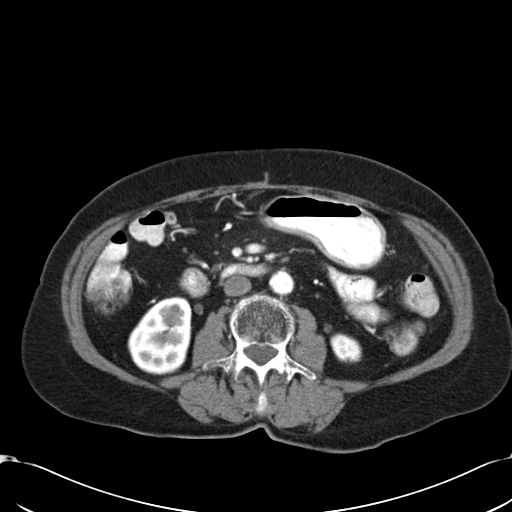
[im 63/120  soft-tissue]
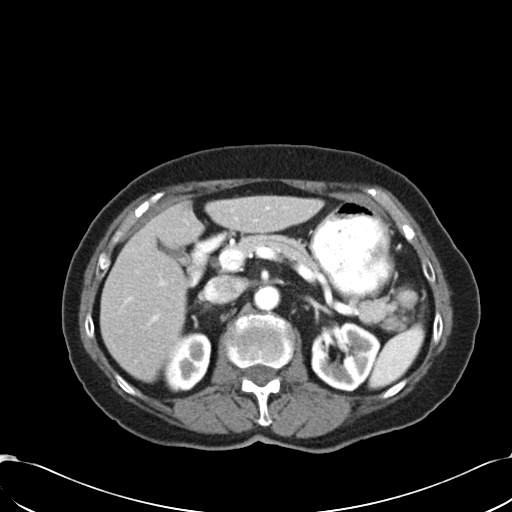
[im 69/120  soft-tissue]
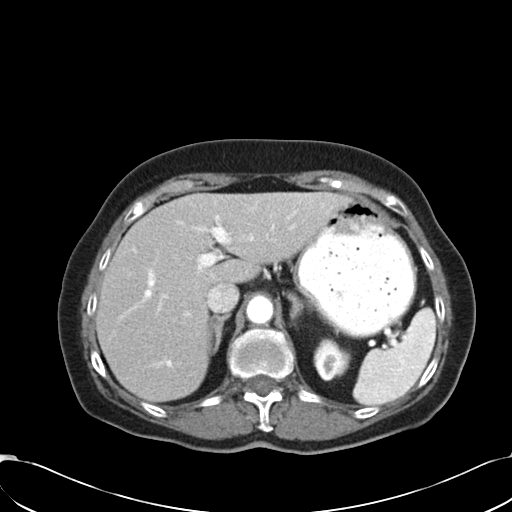
[im 80/120  soft-tissue]
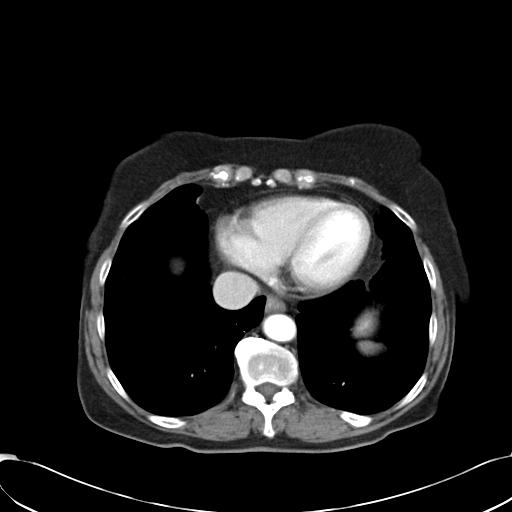
[im 80/120  bone]
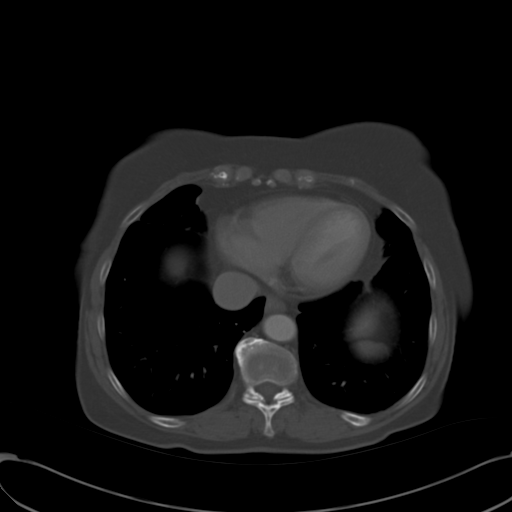
[im 86/120  soft-tissue]
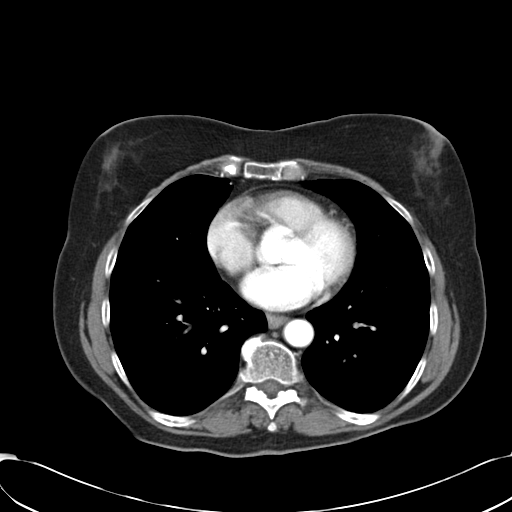
[im 97/120  soft-tissue]
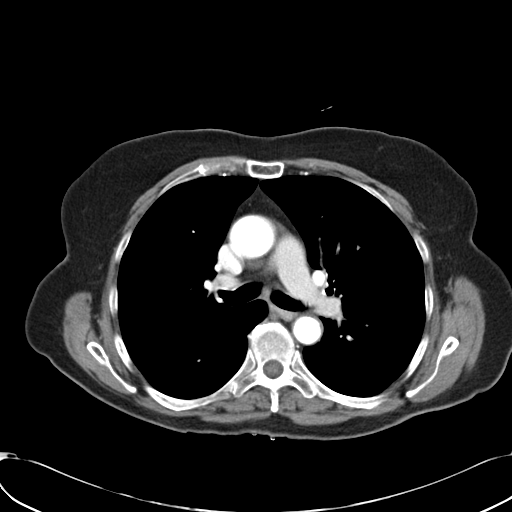
[im 97/120  lung]
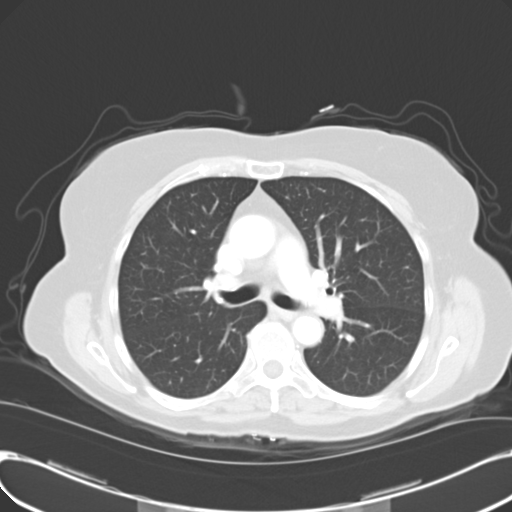
[im 103/120  soft-tissue]
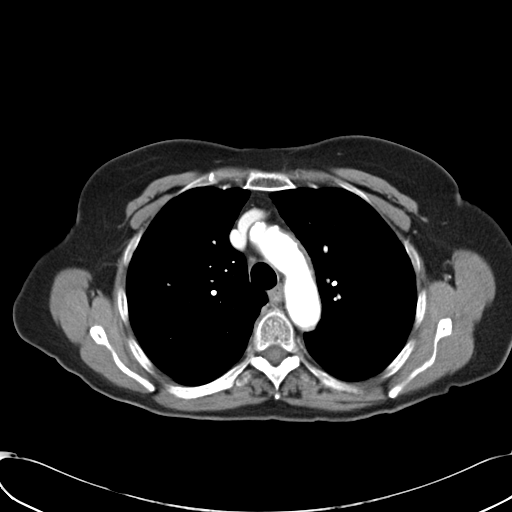
[im 103/120  lung]
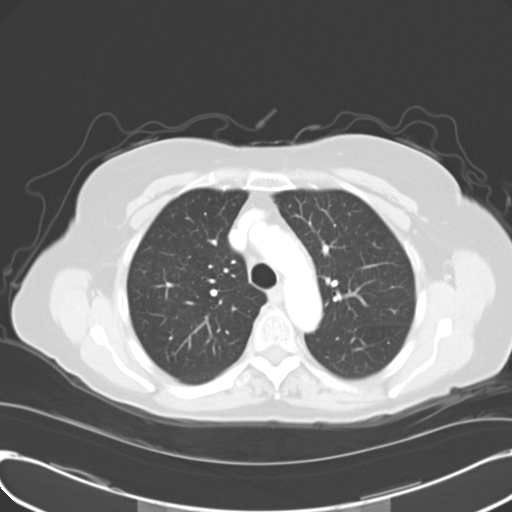
[im 108/120  lung]
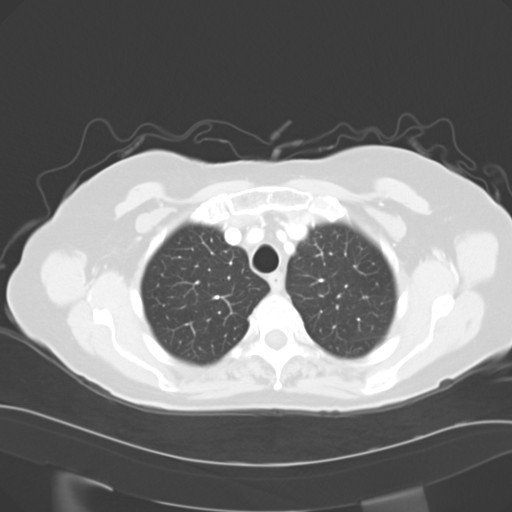
[im 114/120  soft-tissue]
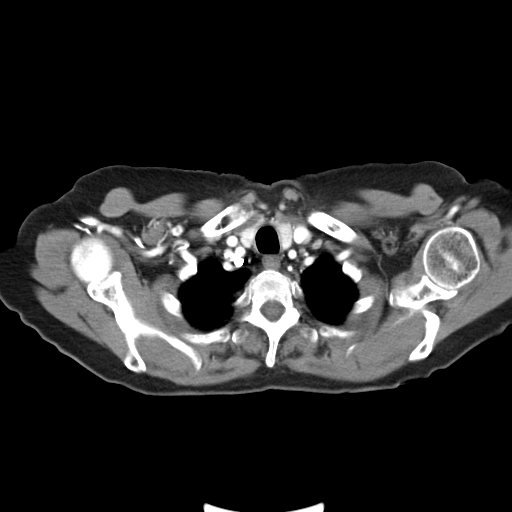
[im 114/120  lung]
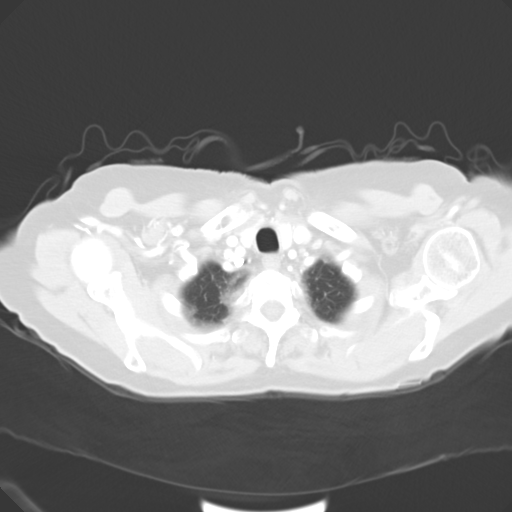

[14 of 32 positions shown; findings below may reference images not displayed]

PROCEDURE:     CT  - CT CHEST ABDOMEN AND PELVIS W  - [DATE] [DATE]

RESULT:     Helical 5 mm sections were obtained from the thoracic inlet
through the lung bases status post intravenous administration of 80 ml
[BH]. This study is compared to a previous study dated [DATE].

Evaluation of the mediastinum and hilar regions and structures demonstrates
no evidence of mediastinal or hilar adenopathy or masses. Evaluation of the
lung parenchyma demonstrates a mild area of increased density along the
medial basal portion of the medial segment RIGHT middle lobe. Differential
considerations are atelectasis versus infiltrate. Considering the patient's
history of lymphoma, lymphomatous involvement cannot be completely excluded
though is of lower differential consideration. This area can be re-evaluated
if and as clinically warranted in 4-6 weeks.

ABDOMEN AND PELVIS: Helical 5 mm sections were obtained from the lung bases
through the pubic symphysis status post intravenous administration of 80 ml
of [BH] and oral contrast. The liver, spleen, adrenals, pancreas and
kidneys are unremarkable. The celiac, SMA, IMA, SMV, portal vein are patent.
There is no evidence of abdominal aortic aneurysm. There is no evidence of
abdominal or pelvic free fluid, drainable loculated fluid collections,
masses nor adenopathy. There is no CT evidence of diverticulitis, bowel
obstruction, colitis or enteritis.
IMPRESSION: 1.Patchy nodular area of increased density within the base of the medial
segment RIGHT middle lobe. Differential considerations are atelectasis
versus infiltrate. An area of lymphomatous involvement cannot be completely
excluded considering the patient's history and this finding can be monitored
if and as clinically warranted.
2.No further CT evidence reflecting the sequela of recurrent disease.

## 2007-03-24 IMAGING — NM NM  CARDIAC MUGA REST SCAN 2 0F 2
7 series · 27 of 27 positions shown · non-contrast
Comparison: none

REASON FOR EXAM: HX lymphoma  s/p chemo   bilat leg swelling
COMMENTS:

[Series 1000: lao 45 · 6.59mm/px · 1 of 1 slices shown]
[im 1/1]
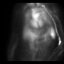

[Series 1000: ant · 6.59mm/px · 1 of 1 slices shown]
[im 1/1]
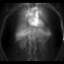

[Series 1000: lao 70 · 6.59mm/px · 1 of 1 slices shown]
[im 1/1]
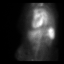

[Series 1000: lao 45-gated (results) · 6.59mm/px · 6 of 24 frames shown]
[frame 3/24]
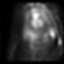
[frame 7/24]
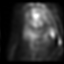
[frame 11/24]
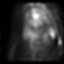
[frame 15/24]
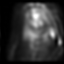
[frame 19/24]
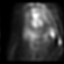
[frame 23/24]
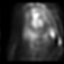

[Series 1000: lao 45-gated · 6.59mm/px · 6 of 24 frames shown]
[frame 3/24]
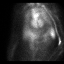
[frame 7/24]
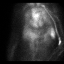
[frame 11/24]
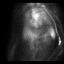
[frame 15/24]
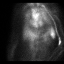
[frame 19/24]
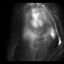
[frame 23/24]
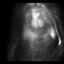

[Series 1000: lao 70-gated · 6.59mm/px · 6 of 24 frames shown]
[frame 3/24]
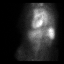
[frame 7/24]
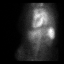
[frame 11/24]
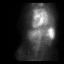
[frame 15/24]
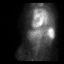
[frame 19/24]
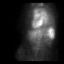
[frame 23/24]
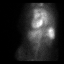

[Series 1000: ant-gated · 6.59mm/px · 6 of 24 frames shown]
[frame 3/24]
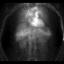
[frame 7/24]
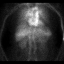
[frame 11/24]
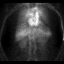
[frame 15/24]
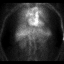
[frame 19/24]
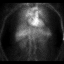
[frame 23/24]
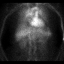

[27 of 27 positions shown; findings below may reference images not displayed]

PROCEDURE:     NM  - NM REST MUGA SCAN [DATE] OF [DATE]  - [DATE] [DATE] [DATE]  [DATE]

RESULT:      The patient's blood pool was labeled with 21.8 mCi of
technetium 99m labeled pertechnetate with 3 ml of  pyrophosphate. The left
ventricular ejection fraction is calculated at 54%.  This has decreased from
approximately 65%  from the prior study in [DATE]. It still remains
within the limits of normal.
IMPRESSION: Left ventricular ejection fraction measures roughly 54% which is still
normal but which does reflect a decrease since the study of approximately
three years ago.

## 2007-03-28 ENCOUNTER — Ambulatory Visit: Payer: Self-pay | Admitting: Internal Medicine

## 2007-06-28 ENCOUNTER — Ambulatory Visit: Payer: Self-pay | Admitting: Internal Medicine

## 2007-07-16 ENCOUNTER — Ambulatory Visit: Payer: Self-pay | Admitting: Internal Medicine

## 2007-07-21 IMAGING — CT CT CHEST-ABD-PELV W/ CM
1 of 2 series · 15 of 29 positions shown, 20 images · non-contrast
Comparison: none

REASON FOR EXAM: abdominal lymphoma eval for progression
COMMENTS:

PROCEDURE:     CT  - CT CHEST ABDOMEN AND PELVIS W  - [DATE] [DATE]
RESULT:
HISTORY: Abdominal lymphoma.

[Series 2: soft tissue · axial · 0.81mm/px · z∈[-326,+218]mm · 15 of 123 slices shown, 20 images]
[im 7/123  mediastinal]
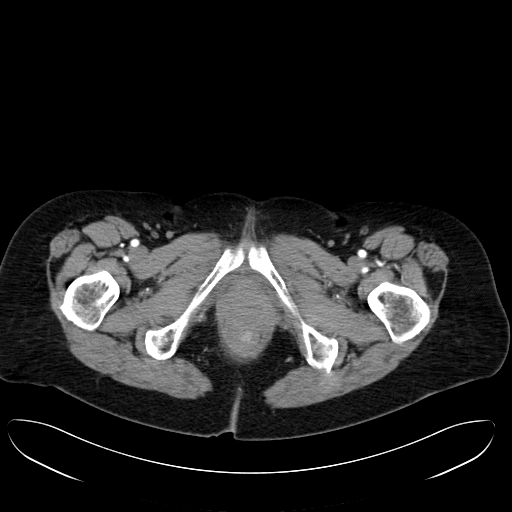
[im 7/123  bone]
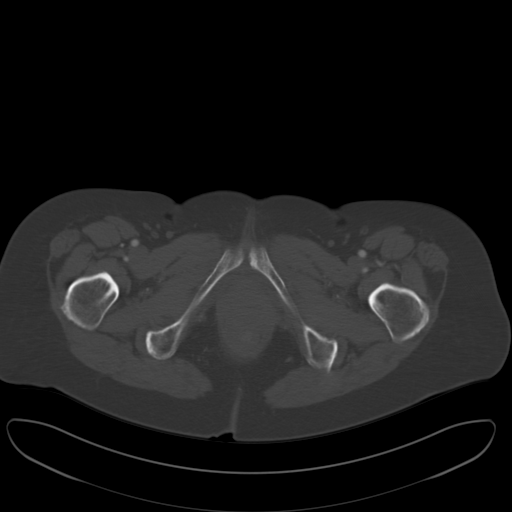
[im 14/123  mediastinal]
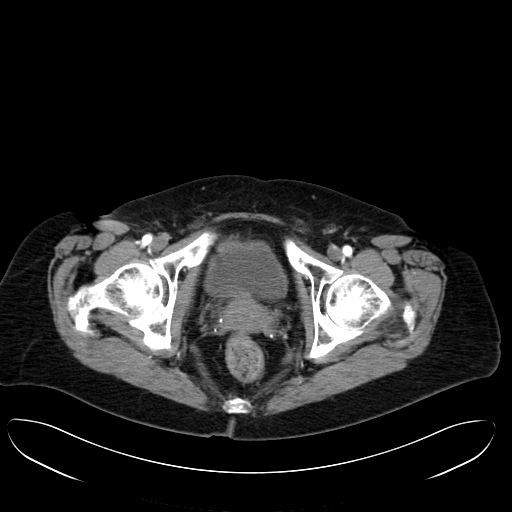
[im 28/123  mediastinal]
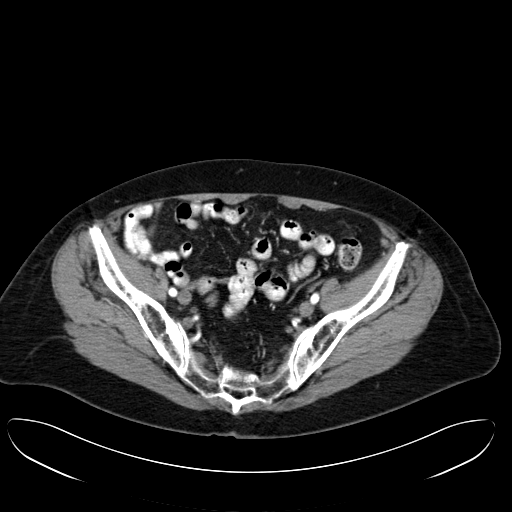
[im 34/123  mediastinal]
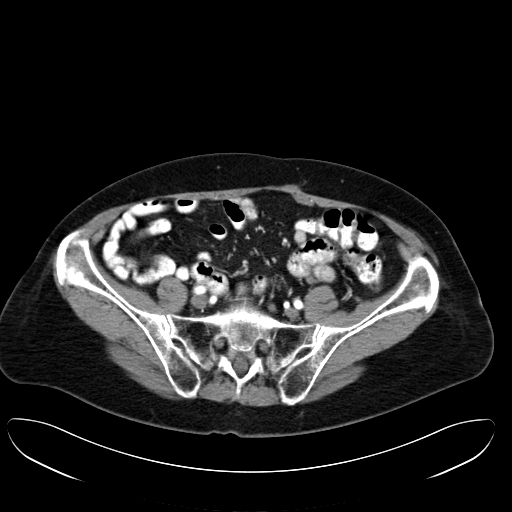
[im 41/123  mediastinal]
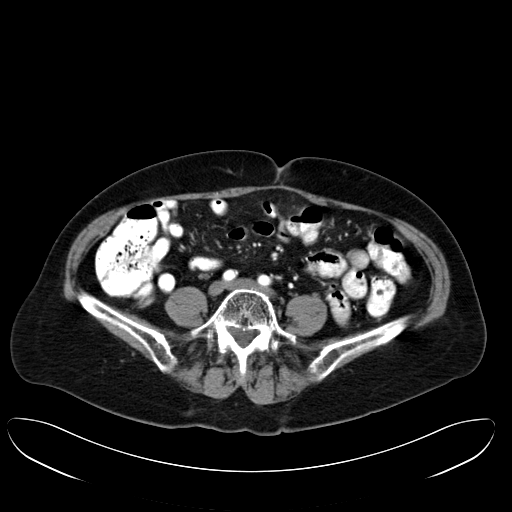
[im 55/123  mediastinal]
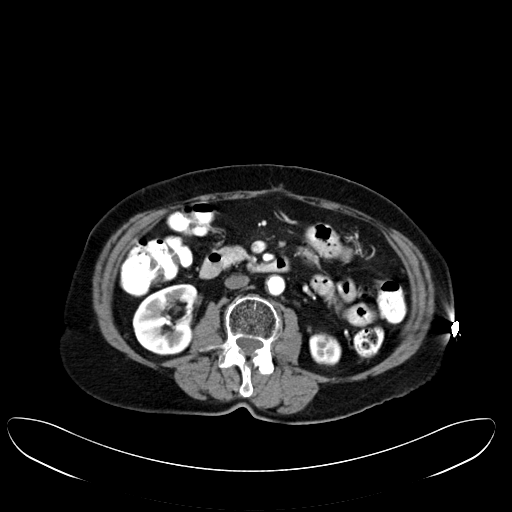
[im 60/123  mediastinal]
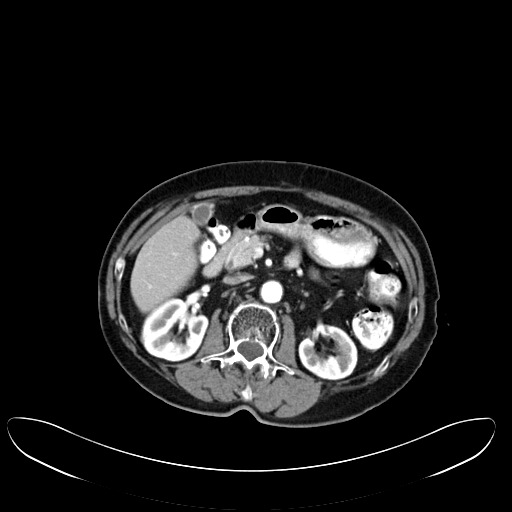
[im 62/123  mediastinal]
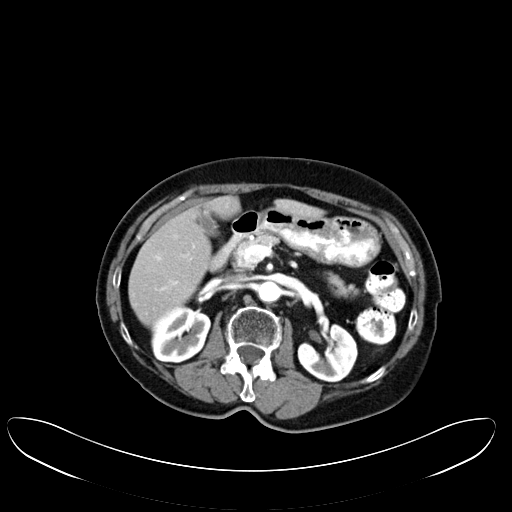
[im 68/123  mediastinal]
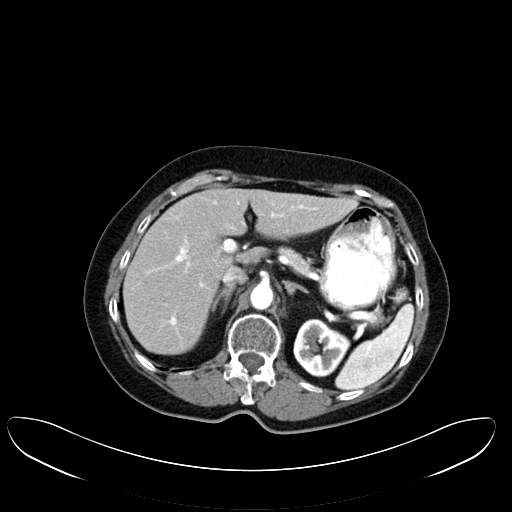
[im 68/123  bone]
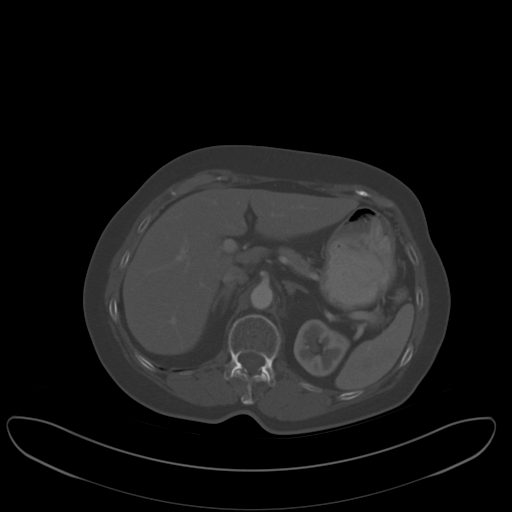
[im 82/123  mediastinal]
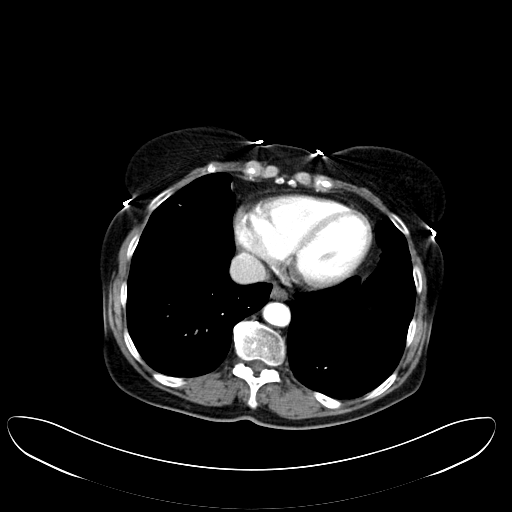
[im 89/123  mediastinal]
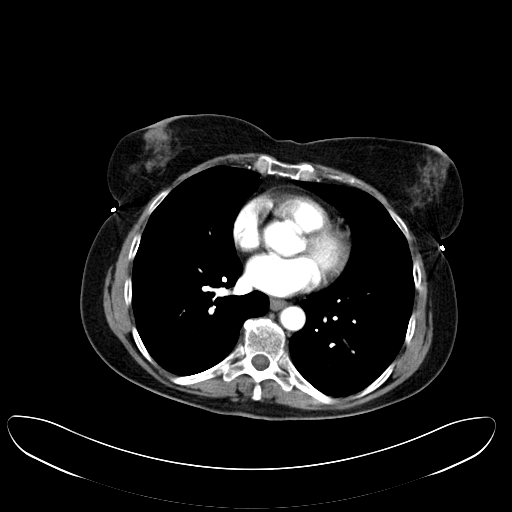
[im 95/123  mediastinal]
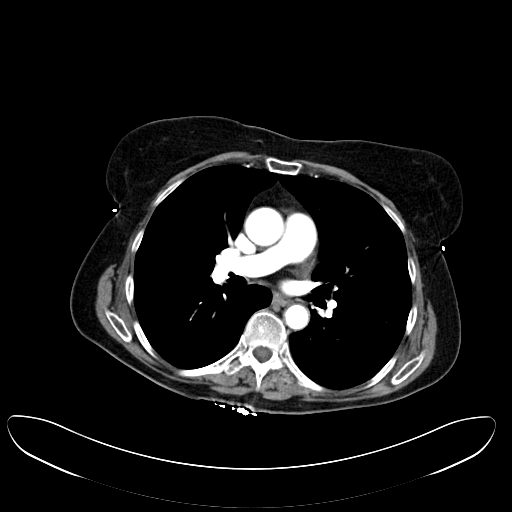
[im 95/123  lung]
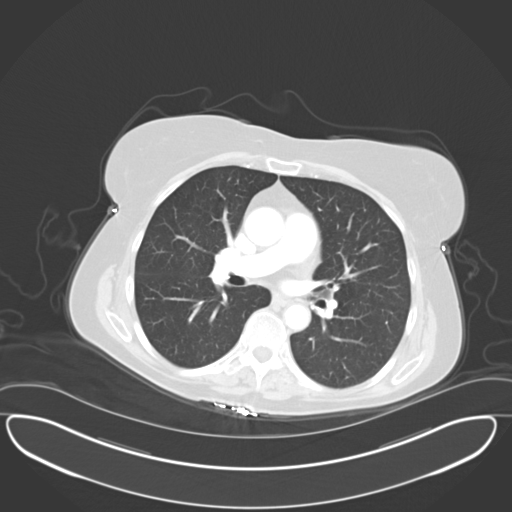
[im 102/123  lung]
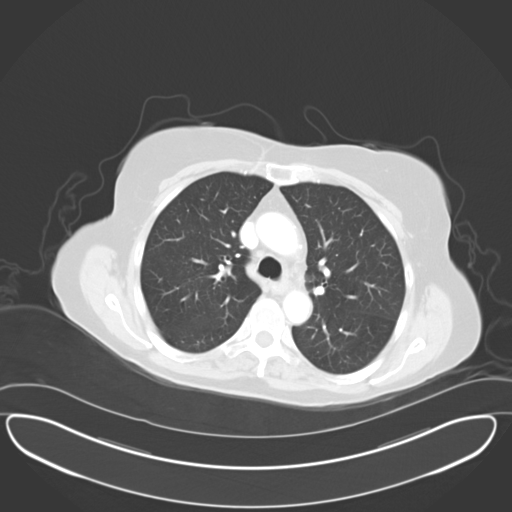
[im 109/123  mediastinal]
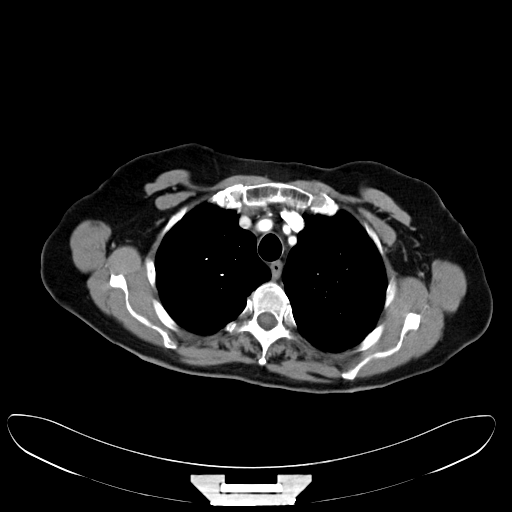
[im 109/123  lung]
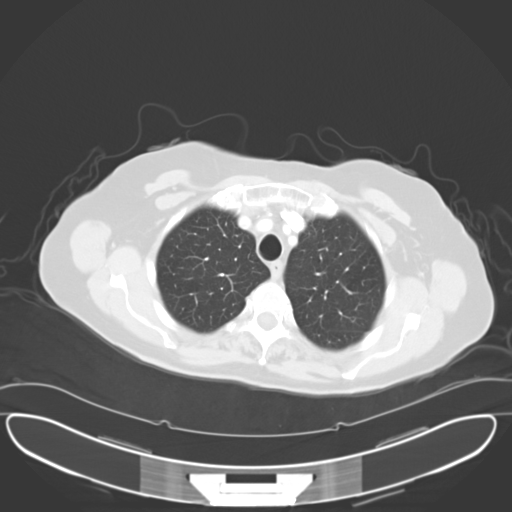
[im 116/123  mediastinal]
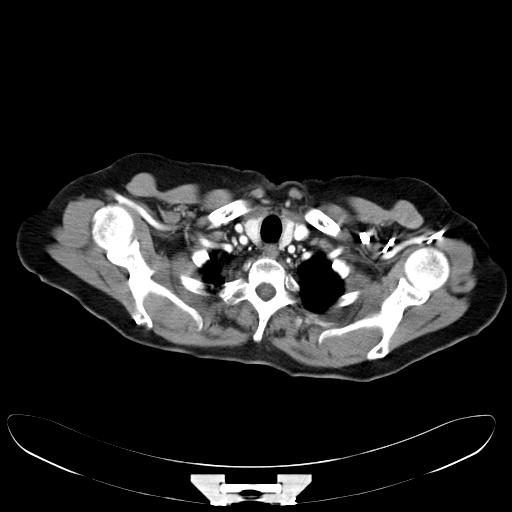
[im 116/123  lung]
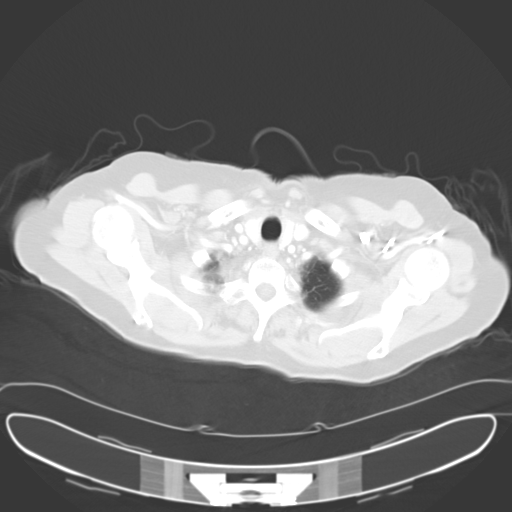

[15 of 29 positions shown; findings below may reference images not displayed]

COMPARISON STUDIES: Prior study of [DATE].

PROCEDURE AND FINDINGS: The mediastinum and hilar structures are normal.
The thoracic aorta is normal. Small paratracheal lymph nodes are noted.
Large airways are patent.  No focal pulmonary lesions are identified.

The liver and spleen are normal. The pancreas is normal. The adrenals are
normal. No focal renal abnormalities are identified. The retroperitoneum is
unremarkable. The RIGHT lower quadrant is unremarkable. There is no bowel
distention. Calcified pelvic density is noted consistent with phleboliths.
Degenerative change noted of the lumbar spine.
IMPRESSION: 1.No evidence of recurrent disease by CT. The exam is stable from [DATE].

## 2007-07-28 ENCOUNTER — Ambulatory Visit: Payer: Self-pay | Admitting: Internal Medicine

## 2007-09-18 ENCOUNTER — Ambulatory Visit: Payer: Self-pay | Admitting: Internal Medicine

## 2007-11-12 ENCOUNTER — Ambulatory Visit: Payer: Self-pay | Admitting: Internal Medicine

## 2007-11-24 ENCOUNTER — Emergency Department: Payer: Self-pay | Admitting: Unknown Physician Specialty

## 2007-11-24 IMAGING — CR DG WRIST COMPLETE 3+V*R*
1 series · 4 of 4 positions shown · non-contrast
Comparison: none

REASON FOR EXAM: Fall
COMMENTS:

[Series 1: view not recorded · 0.17mm/px · 4 of 4 slices shown]
[im 1/4]
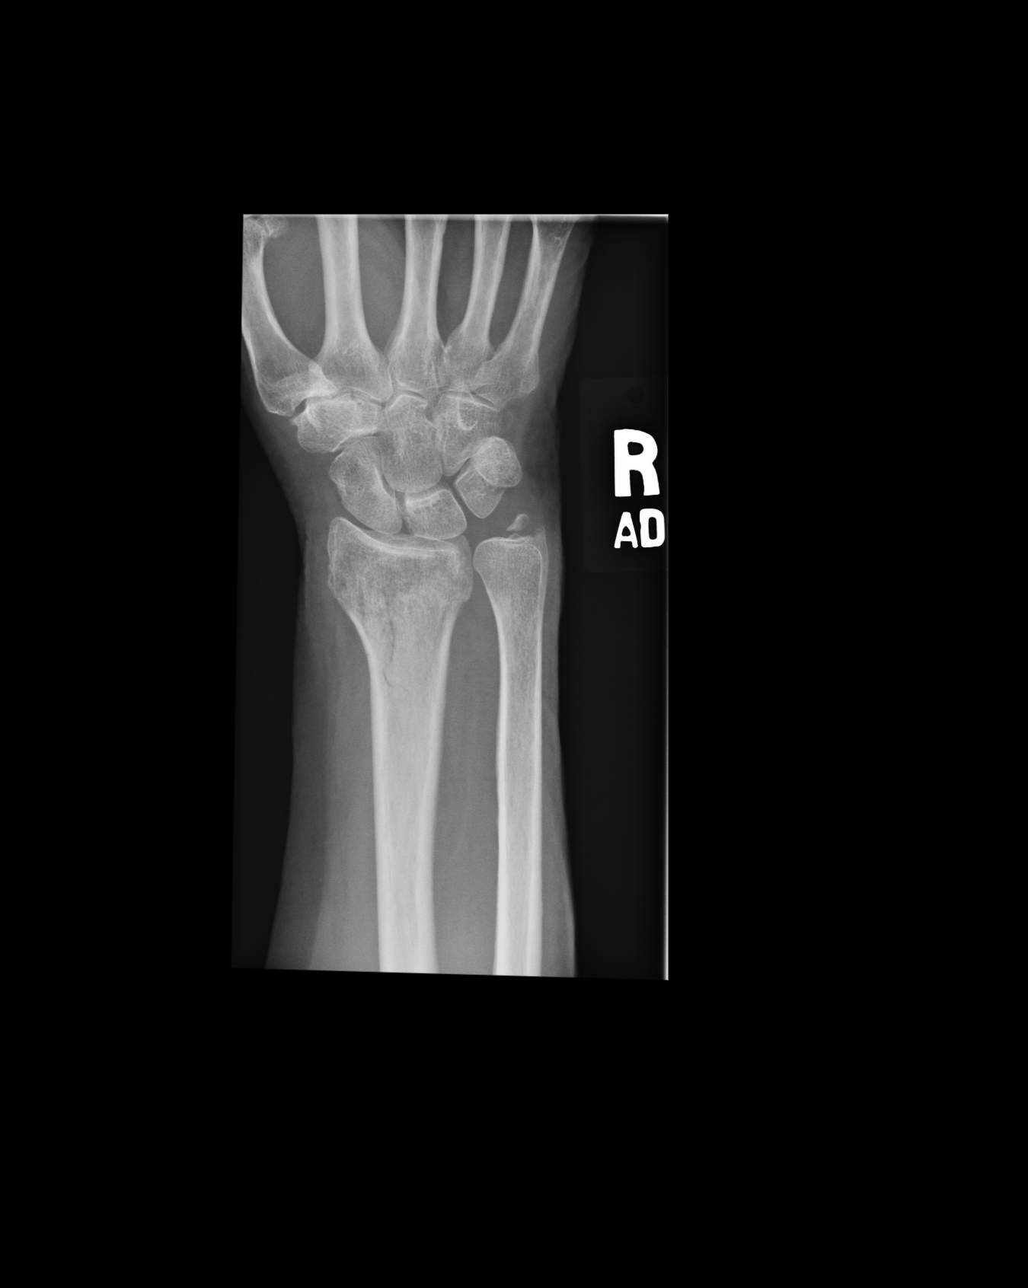
[im 2/4]
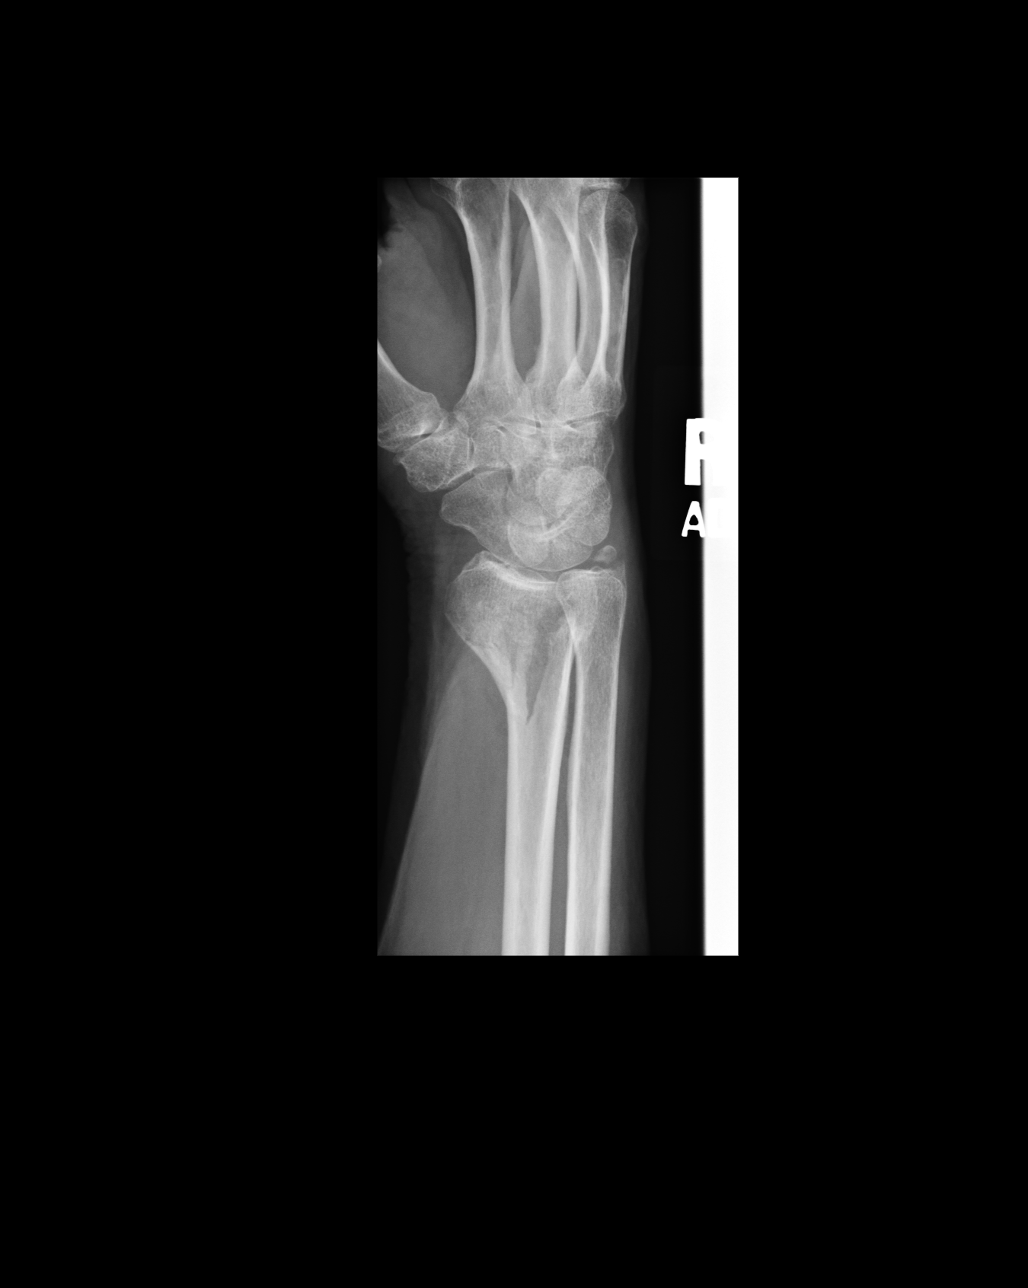
[im 3/4]
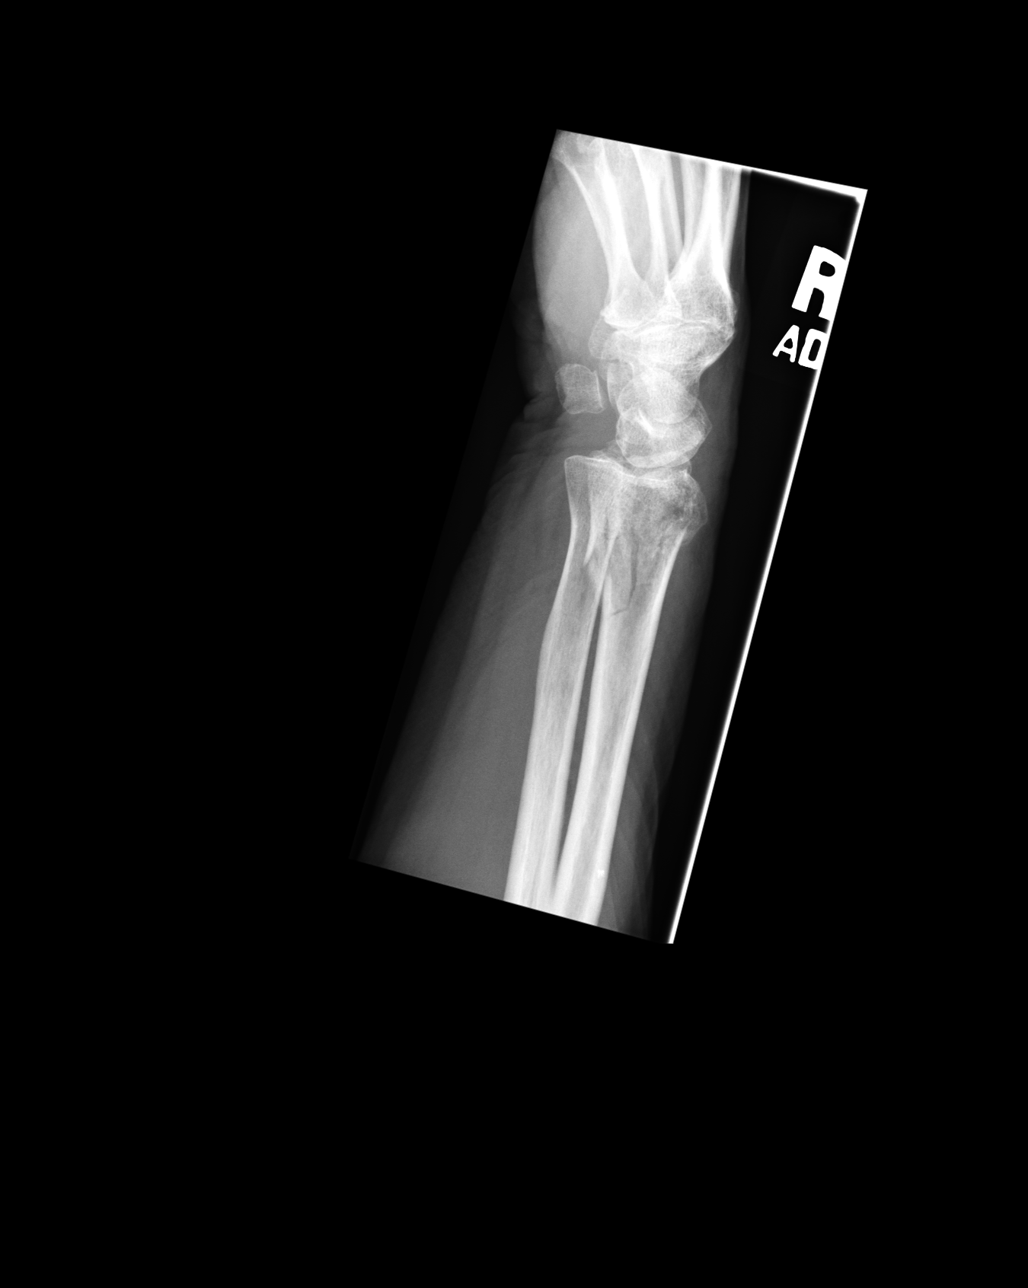
[im 4/4]
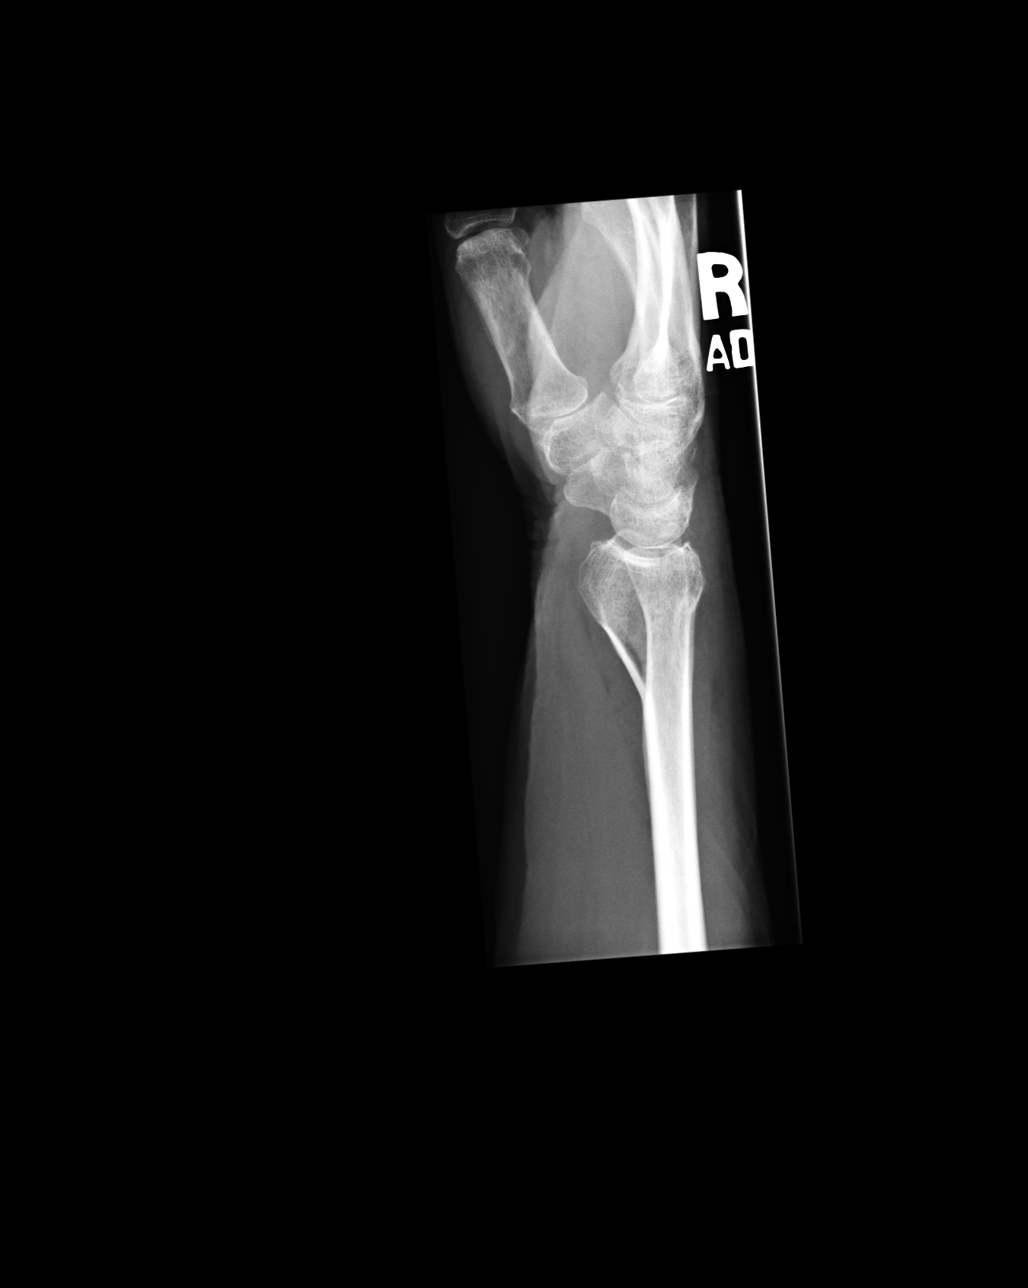

[4 of 4 positions shown; findings below may reference images not displayed]

PROCEDURE:     DXR - DXR WRIST RT COMP WITH OBLIQUES  - [DATE] [DATE]

RESULT:     There is a comminuted fracture of the distal RIGHT radius. Bony
fracture components are minimally displaced. The fracture is noted to extend
into the articular portion of the distal radius.

Also noted is a fracture of the base of the ulnar styloid.
IMPRESSION: 1. Comminuted fracture of the distal radius.
2. There is a fracture of the base of the ulnar styloid.

## 2007-11-26 ENCOUNTER — Ambulatory Visit: Payer: Self-pay | Admitting: Internal Medicine

## 2008-06-27 ENCOUNTER — Ambulatory Visit: Payer: Self-pay | Admitting: Internal Medicine

## 2008-07-05 ENCOUNTER — Ambulatory Visit: Payer: Self-pay | Admitting: Internal Medicine

## 2008-07-05 IMAGING — CT CT CHEST-ABD-PELV W/ CM
1 of 2 series · 14 of 29 positions shown, 19 images · non-contrast
Comparison: none

REASON FOR EXAM: Restage lymphoma
COMMENTS:

[Series 3: soft tissue · axial · 0.78mm/px · z∈[-314,+220]mm · 14 of 121 slices shown, 19 images]
[im 7/121  mediastinal]
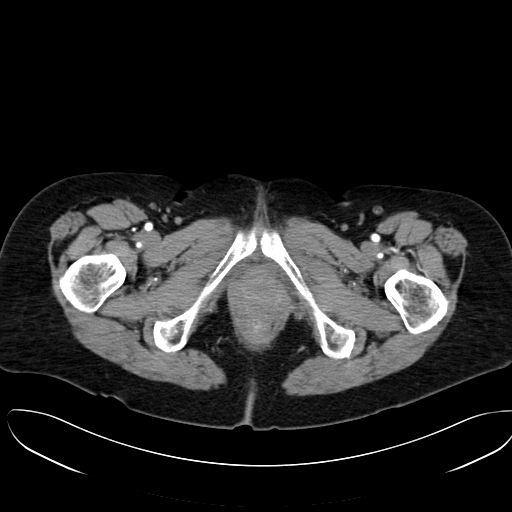
[im 7/121  bone]
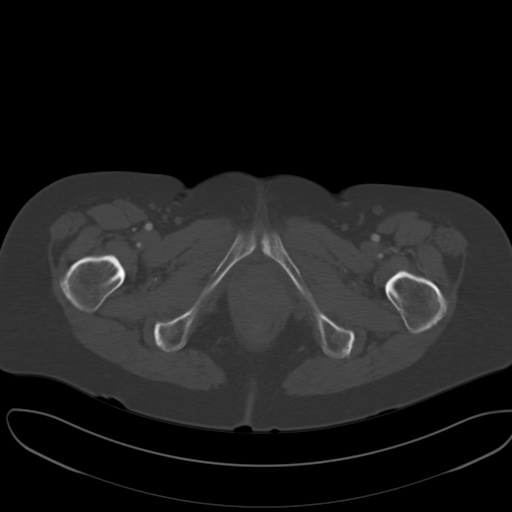
[im 21/121  mediastinal]
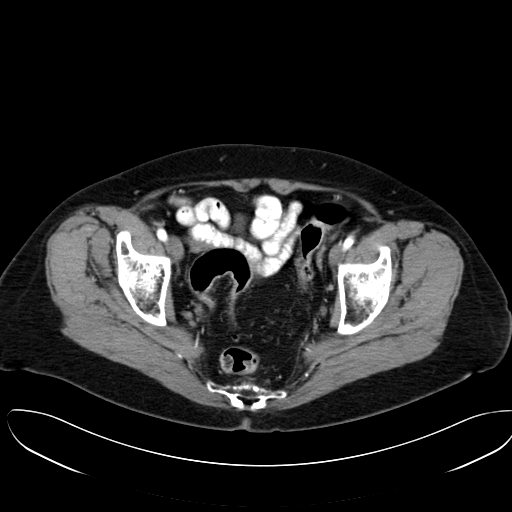
[im 27/121  mediastinal]
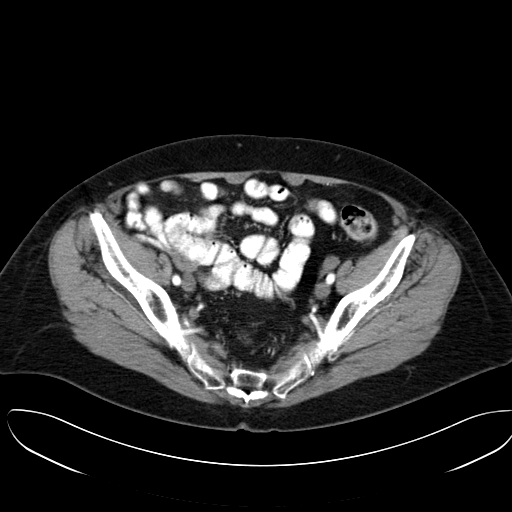
[im 34/121  mediastinal]
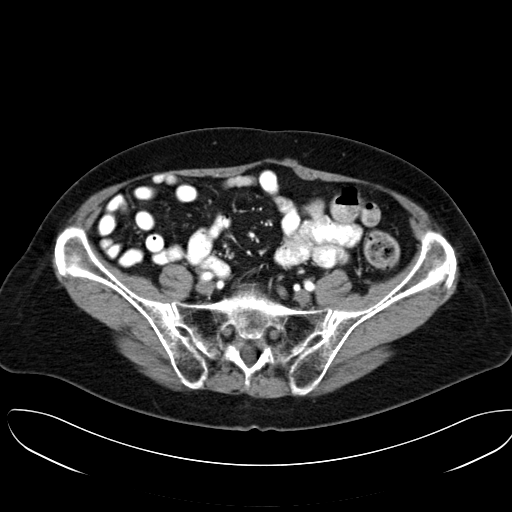
[im 47/121  mediastinal]
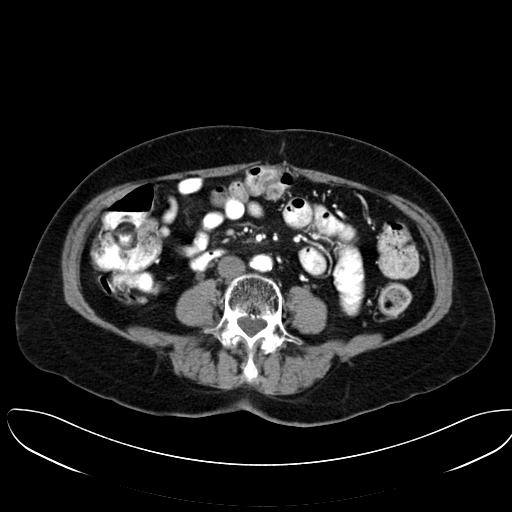
[im 54/121  mediastinal]
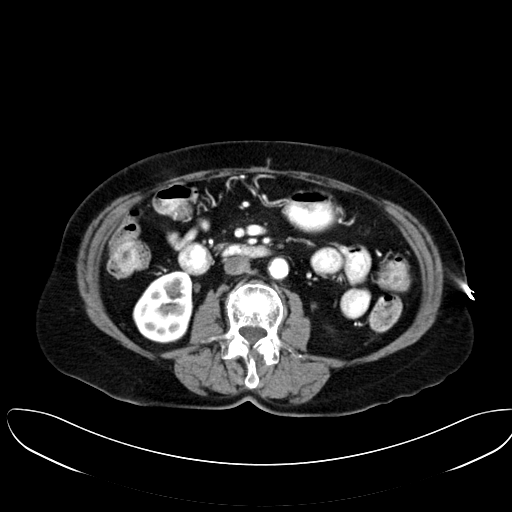
[im 61/121  mediastinal]
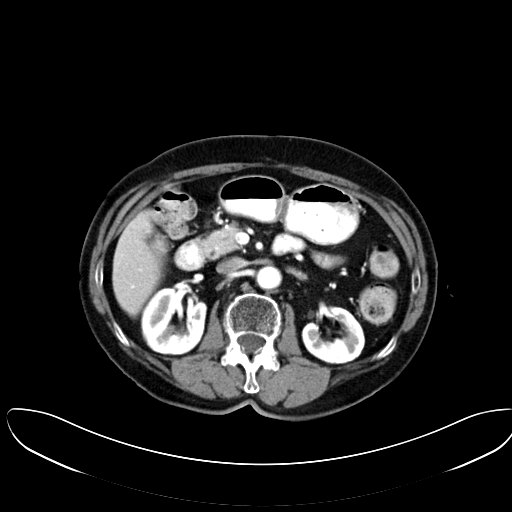
[im 67/121  mediastinal]
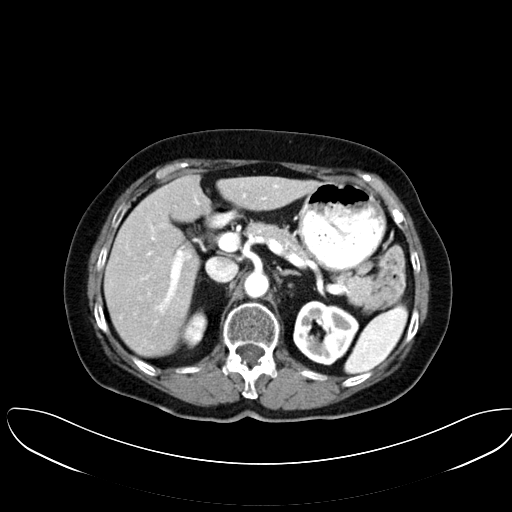
[im 74/121  mediastinal]
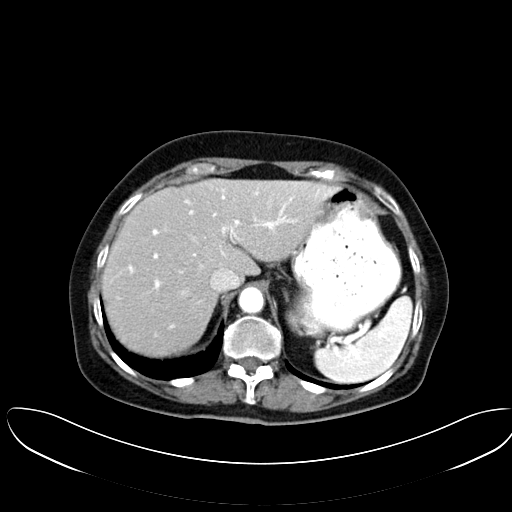
[im 74/121  bone]
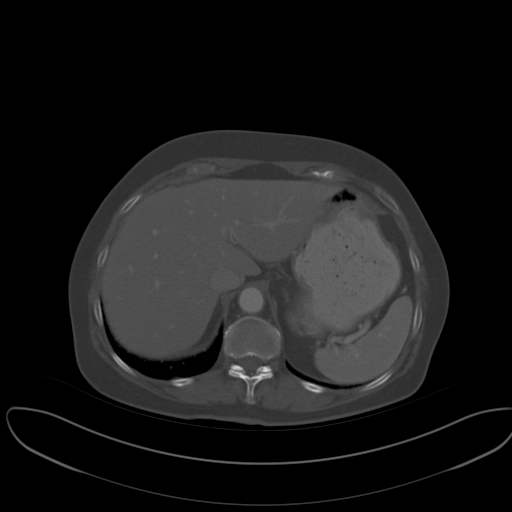
[im 87/121  mediastinal]
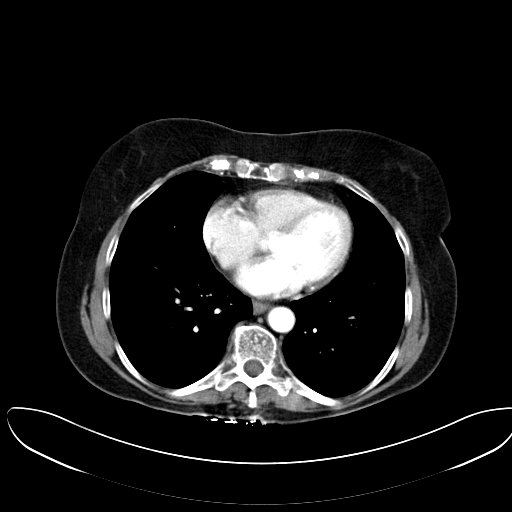
[im 94/121  mediastinal]
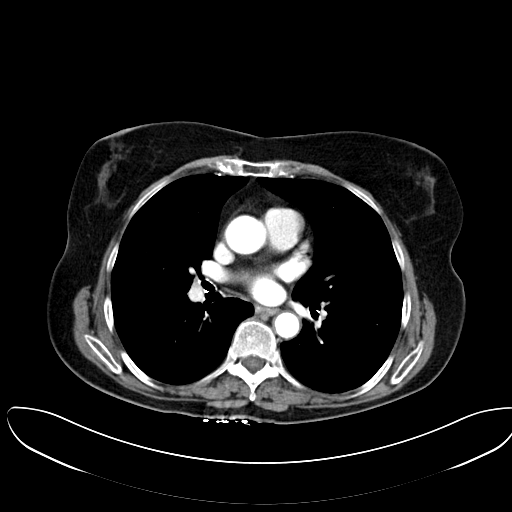
[im 94/121  lung]
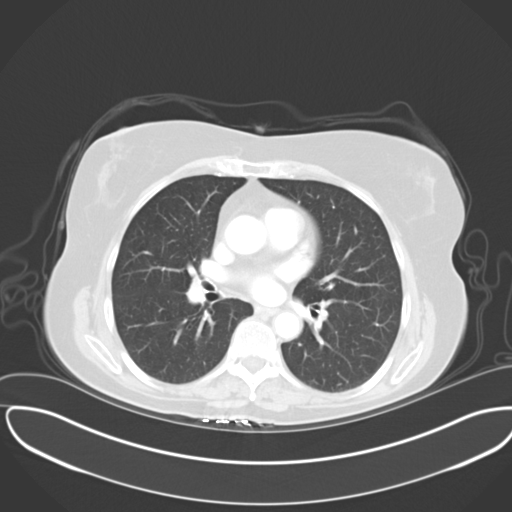
[im 101/121  mediastinal]
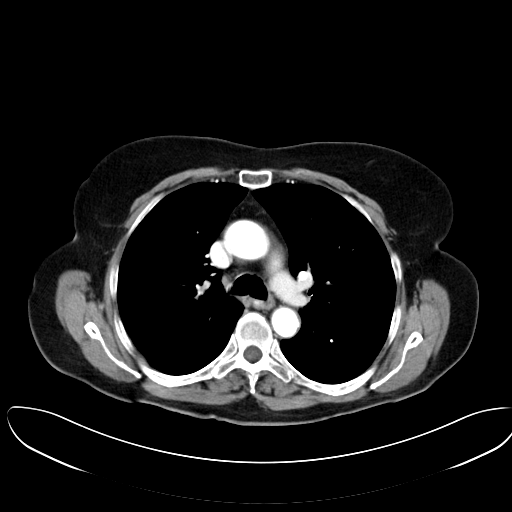
[im 101/121  lung]
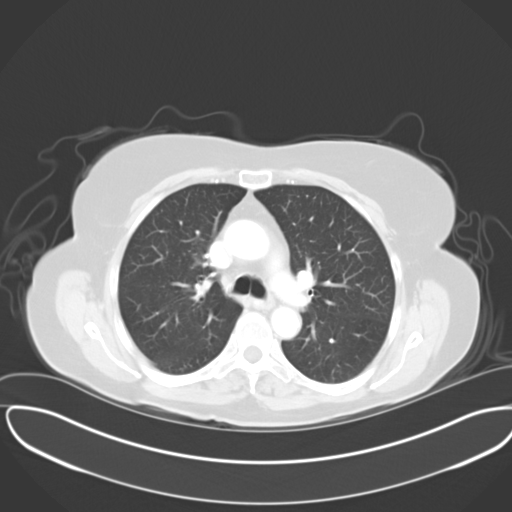
[im 107/121  lung]
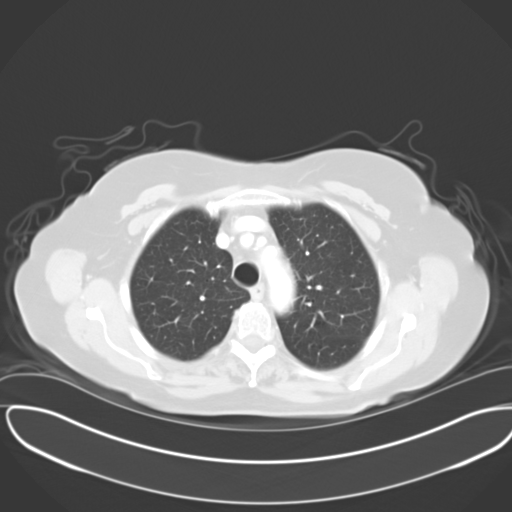
[im 114/121  mediastinal]
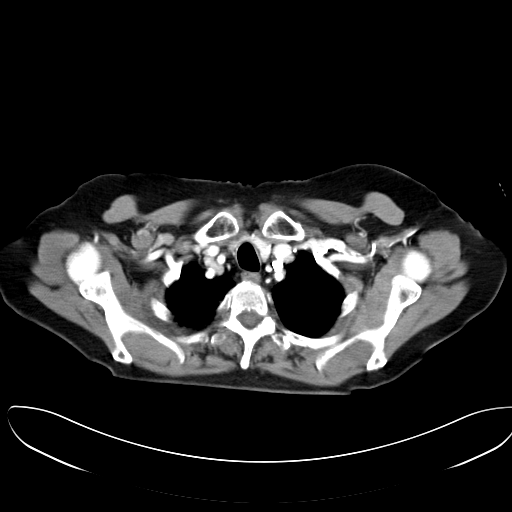
[im 114/121  lung]
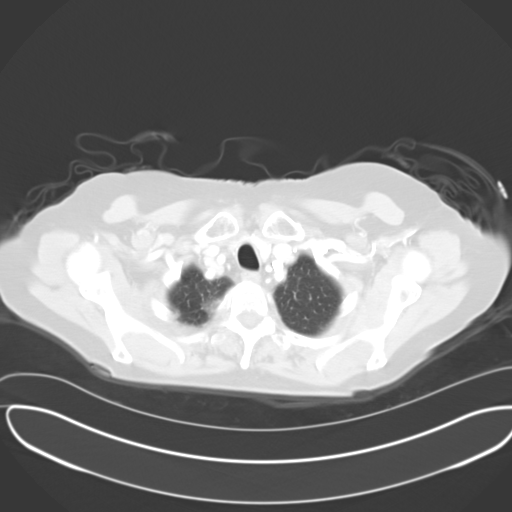

[14 of 29 positions shown; findings below may reference images not displayed]

PROCEDURE:     CT  - CT CHEST ABDOMEN AND PELVIS W  - [DATE]  [DATE]

RESULT:     CT of the chest, abdomen and pelvis is performed utilizing
approximately 85 ml of [A1] iodinated intravenous contrast along with
oral contrast.

Comparison is made to the previous examination performed on [DATE].

At the time of dictation the [employer] software is utilized to perform
multiplanar and three dimensional reconstructions for interpretation. The
original axial reconstructions are at 5.0 mm slice thickness.
FINDINGS: The thyroid lobes enhance normally and show no evidence of
enlargement. There is no supraclavicular, axillary, mediastinal or hilar
mass or adenopathy. The heart is not enlarged. There is no pleural or
pericardial effusion evident. There is minimal fibrosis at the RIGHT lung
apex. There is no evidence of interstitial edema. There is minimal
atelectasis or fibrosis in the RIGHT middle lobe and lingula and to a lesser
extent in the LEFT lower lobe. There is no pulmonary mass or nodule.

The spleen is normal in size. There appears to be low attenuation of the
liver suggestive of fatty infiltration. The adrenal glands, gallbladder,
pancreas, aorta and kidneys appear normal. There is no retroperitoneal or
mesenteric mass or adenopathy. There is no abnormal bowel wall thickening or
abnormal bowel distention present. The bony structures appear to be grossly
normal in the thoracic region. A Schmorl's node appears to be present in the
inferior aspect of L3. Some degenerative end-plate hypertrophy and spurring
is present. The uterus is atrophic. No adnexal mass is appreciated. The
urinary bladder appears to be nondistended. No inguinal mass or adenopathy
is present.
IMPRESSION: No findings to suggest recurrent disease or progression of
disease. Stable appearance.

## 2008-07-27 ENCOUNTER — Ambulatory Visit: Payer: Self-pay | Admitting: Internal Medicine

## 2008-09-20 ENCOUNTER — Ambulatory Visit: Payer: Self-pay | Admitting: Internal Medicine

## 2009-06-27 ENCOUNTER — Ambulatory Visit: Payer: Self-pay | Admitting: Internal Medicine

## 2009-07-04 ENCOUNTER — Ambulatory Visit: Payer: Self-pay | Admitting: Internal Medicine

## 2009-07-04 IMAGING — CT CT CHEST-ABD-PELV W/ CM
1 of 2 series · 14 of 32 positions shown, 19 images · non-contrast
Comparison: none

REASON FOR EXAM: lymphoma fu
COMMENTS:

[Series 2: soft tissue · axial · 0.72mm/px · z∈[+478,+1018]mm · 14 of 120 slices shown, 19 images]
[im 6/120  soft-tissue]
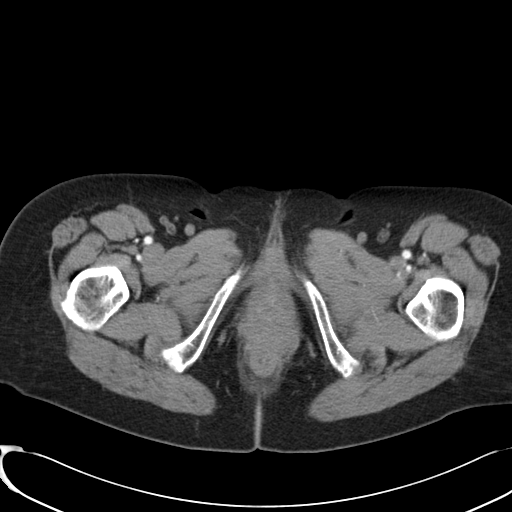
[im 6/120  bone]
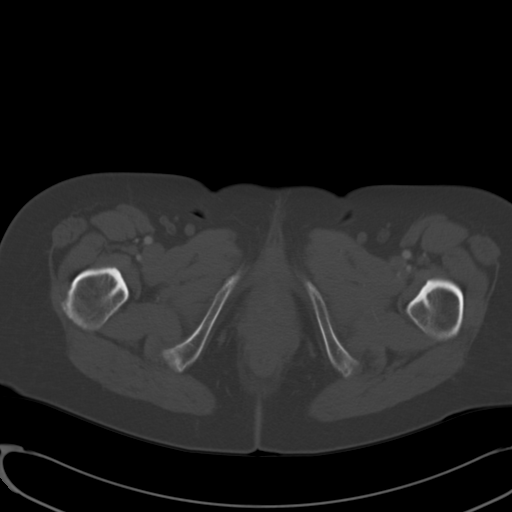
[im 18/120  soft-tissue]
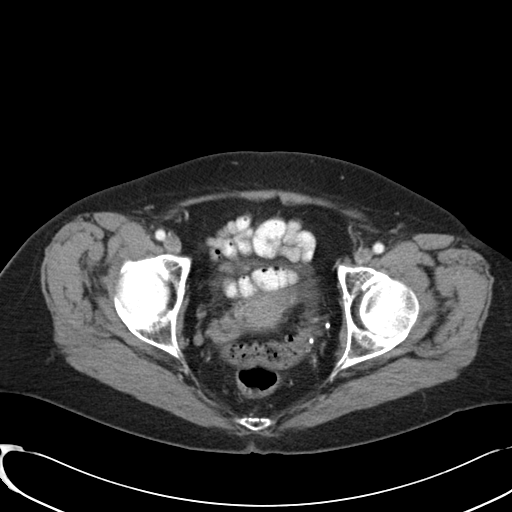
[im 23/120  soft-tissue]
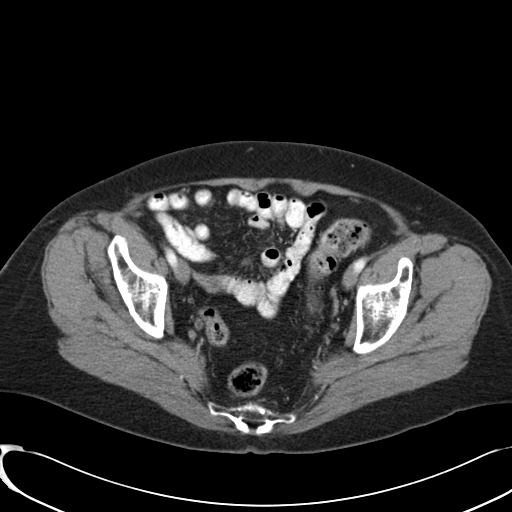
[im 35/120  soft-tissue]
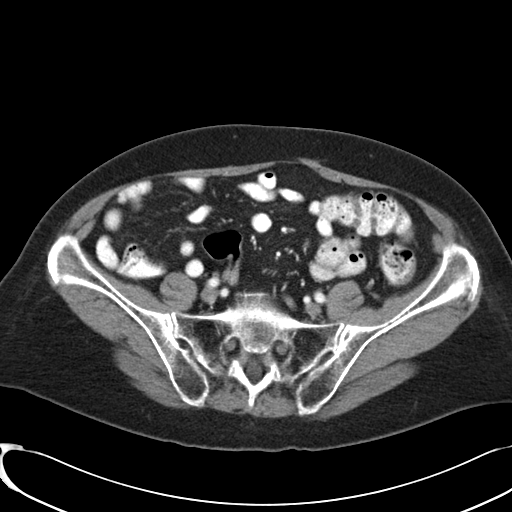
[im 40/120  soft-tissue]
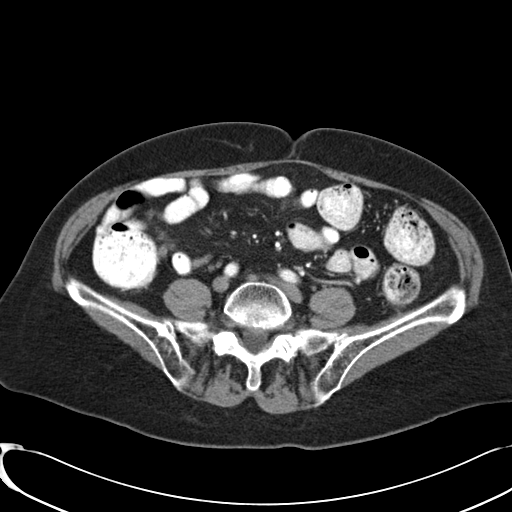
[im 52/120  soft-tissue]
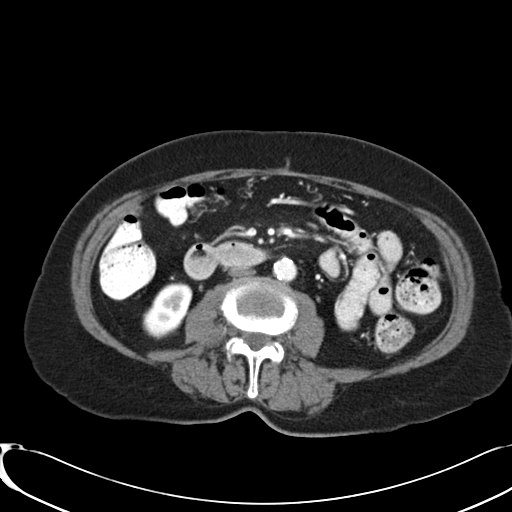
[im 63/120  soft-tissue]
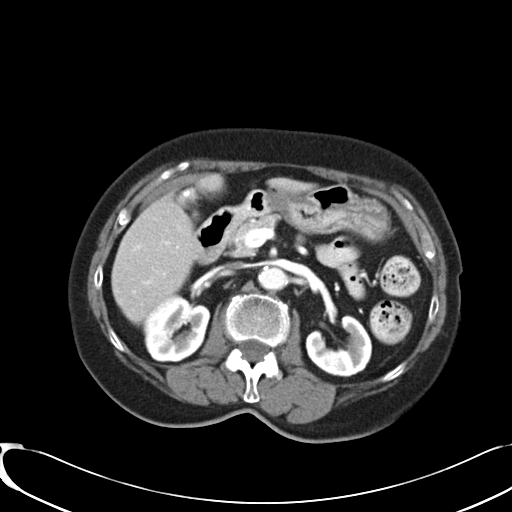
[im 69/120  soft-tissue]
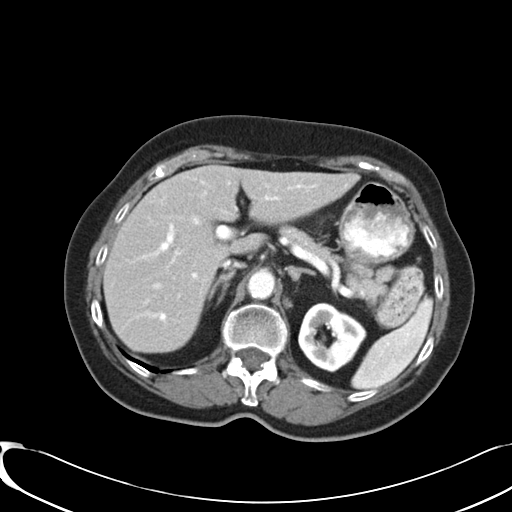
[im 80/120  soft-tissue]
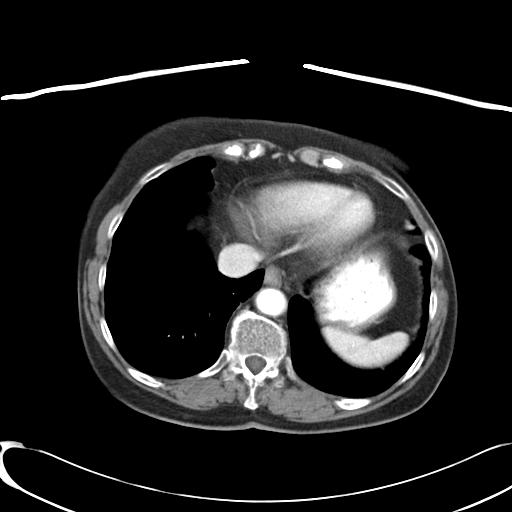
[im 80/120  bone]
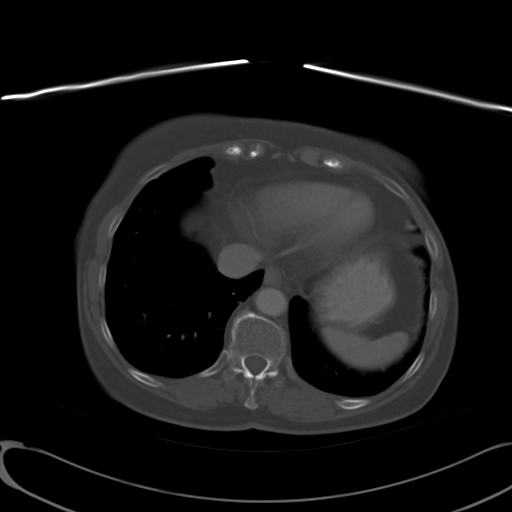
[im 86/120  soft-tissue]
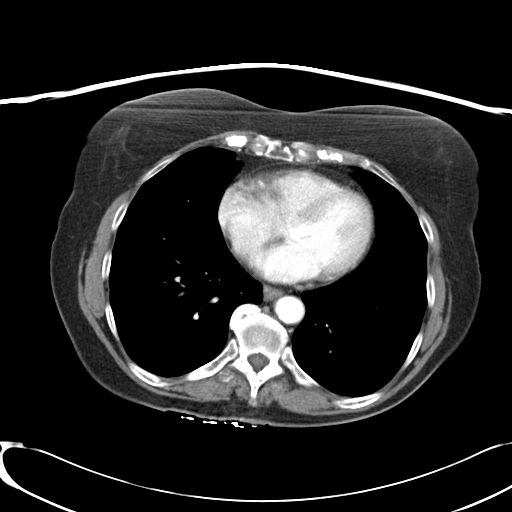
[im 97/120  soft-tissue]
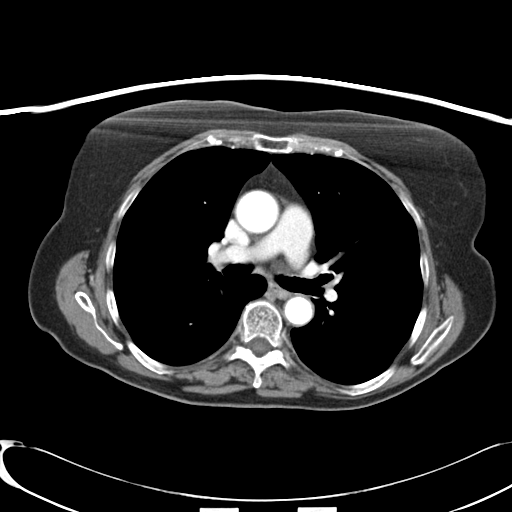
[im 97/120  lung]
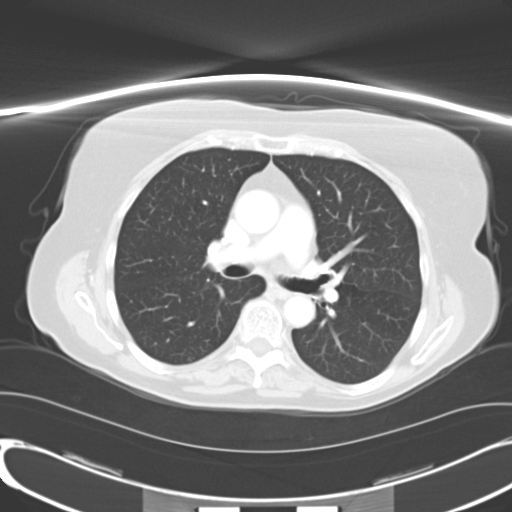
[im 103/120  soft-tissue]
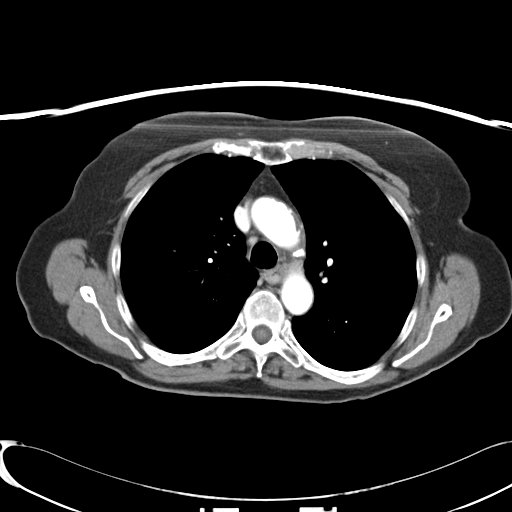
[im 103/120  lung]
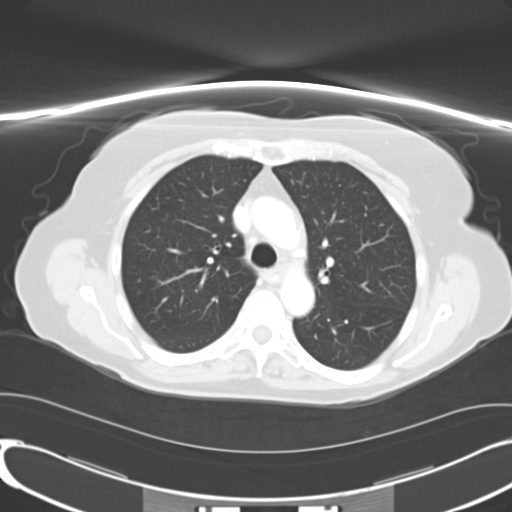
[im 108/120  lung]
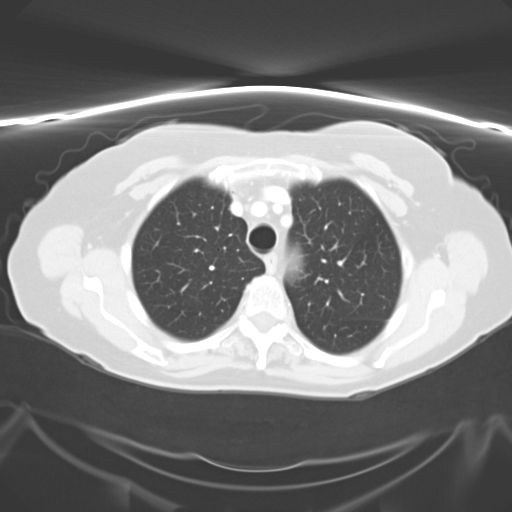
[im 114/120  soft-tissue]
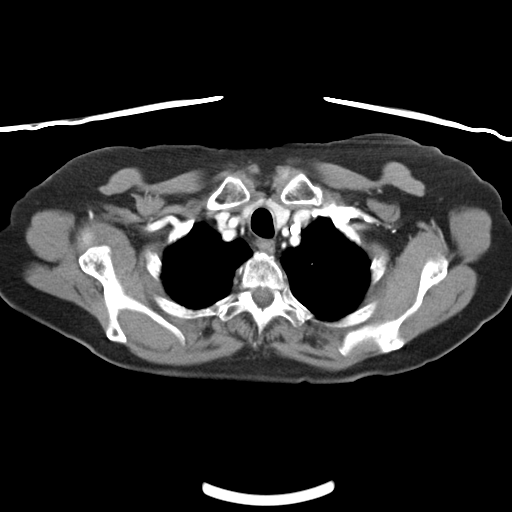
[im 114/120  lung]
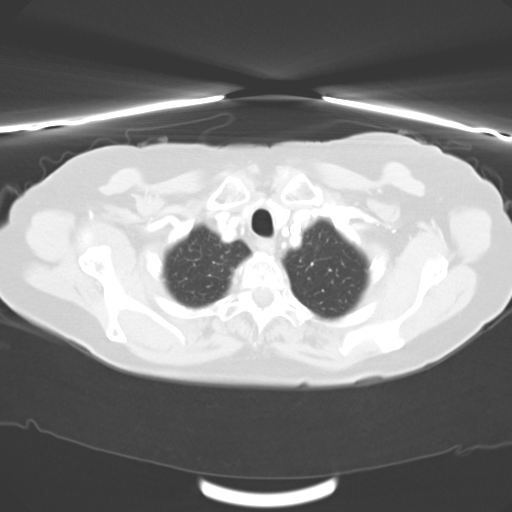

[14 of 32 positions shown; findings below may reference images not displayed]

PROCEDURE:     CT  - CT CHEST ABDOMEN AND PELVIS W  - [DATE] [DATE]

RESULT:     Axial CT scanning was performed through the chest, abdomen, and
pelvis at 5 mm intervals and slice thicknesses following intravenous
administration of 85 cc of [J1]. Review of 3-dimensional reconstructed
images was performed separately on the Web[employer] Server monitor. Comparison
is made to study [DATE]. The patient is undergoing restaging of
lymphoma.

CT scan of the chest: The lungs are well-expanded and clear. There is no
evidence of pulmonary parenchymal mass nor interstitial nor alveolar
infiltrates. At mediastinal window settings the cardiac chambers are normal
in size. The caliber of the thoracic aorta is normal. No pathologic sized
mediastinal or hilar or axillary lymph nodes are demonstrated. The thyroid
lobes are normal in density and symmetric in size. The retrosternal soft
tissues appear normal. There is no pleural nor pericardial effusion. The
thoracic vertebral bodies are preserved in height.
CONCLUSION: I do not see findings suspicious for active lymphoma within the
thorax.

CT scan of the abdomen and pelvis: The liver, spleen, partially distended
stomach, adrenal glands, pancreas, and kidneys are normal in appearance. The
gallbladder is contracted. The abdominal aorta exhibits very mild failure to
taper of its caliber but there is no evidence of an aneurysm. I see no
periaortic nor pericaval lymphadenopathy. The partially contrast-filled
loops of small and large bowel exhibit no acute abnormality. The uterus and
adnexal structures exhibit no suspicious findings. The partially distended
urinary bladder is normal in appearance. There are a few normal sized
inguinal lymph nodes present. There is a prominent Schmorl's node associated
with the inferior endplate of L3.
IMPRESSION: 1. Please see the discussion above regarding findings in the thorax.
2. Within the abdomen and pelvis I do not see findings suspicious for
recurrent lymphoma.
3. The gallbladder is contracted. Correlation with time of the patient's
most recent meal would be useful.

## 2009-07-27 ENCOUNTER — Ambulatory Visit: Payer: Self-pay | Admitting: Internal Medicine

## 2009-09-21 ENCOUNTER — Ambulatory Visit: Payer: Self-pay | Admitting: Internal Medicine

## 2010-05-24 ENCOUNTER — Ambulatory Visit: Payer: Self-pay | Admitting: Family Medicine

## 2010-09-22 ENCOUNTER — Ambulatory Visit: Payer: Self-pay | Admitting: Internal Medicine

## 2010-09-26 NOTE — Assessment & Plan Note (Signed)
Summary: FLU SHOT/JBB  Nurse Visit   Immunizations Administered:  Influenza Vaccine:    Vaccine Type: FLULAVAL    Site: right deltoid    Mfr: [employer]    Dose: 0.5 ml    Route: IM    Given by: Levonne Spiller EMT-P    Exp. Date: 01/25/2011    Lot #: [id number]    VIS given: 03/21/10 version given May 24, 2010.  Orders Added: 1)  Flulaval Injection 3 yrs >IM [Q2036] 2)  Administration Flu vaccine - MCR [G0008]   Immunizations Administered:  Influenza Vaccine:    Vaccine Type: FLULAVAL    Site: right deltoid    Mfr: [employer]    Dose: 0.5 ml    Route: IM    Given by: Levonne Spiller EMT-P    Exp. Date: 01/25/2011    Lot #: [id number]    VIS given: 03/21/10 version given May 24, 2010.  Flu Vaccine Consent Questions:    Do you have a history of severe allergic reactions to this vaccine? no    Any prior history of allergic reactions to egg and/or gelatin? no    Do you have a sensitivity to the preservative Thimersol? no    Do you have a past history of Guillan-Barre Syndrome? no    Do you currently have an acute febrile illness? no    Have you ever had a severe reaction to latex? no    Vaccine information given and explained to patient? yes    Are you currently pregnant? no

## 2011-10-04 ENCOUNTER — Ambulatory Visit: Payer: Self-pay | Admitting: Internal Medicine

## 2012-10-06 ENCOUNTER — Ambulatory Visit: Payer: Self-pay

## 2012-10-06 IMAGING — MG MM CAD SCREENING MAMMO
1 series · 4 of 4 positions shown · non-contrast
Comparison: none

REASON FOR EXAM: SCR MAMMO NO ORDER
COMMENTS:

PROCEDURE:     MAM - MAM DGTL SCRN MAM NO ORDER W/CAD  - [DATE] [DATE]
RESULT:     Comparison made to prior study dated [DATE]. No mass. No
clustered calcification. Exam stable from prior exams. Exam stable from
prior studies dating to [DATE].

[R CC · right · 4 of 4 slices shown]
[im 1/4]
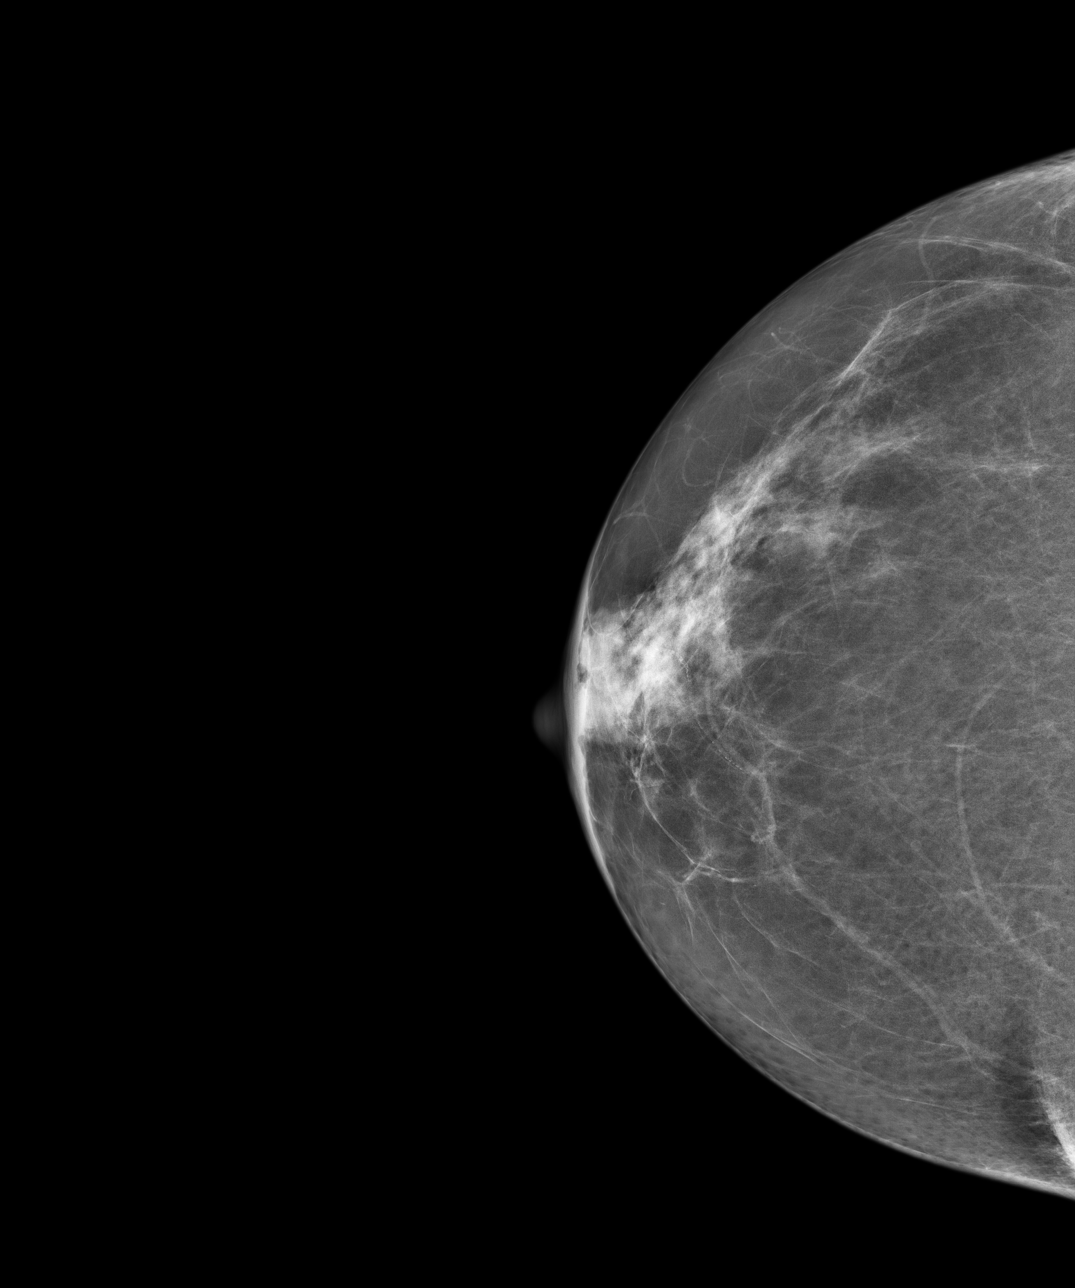
[im 2/4]
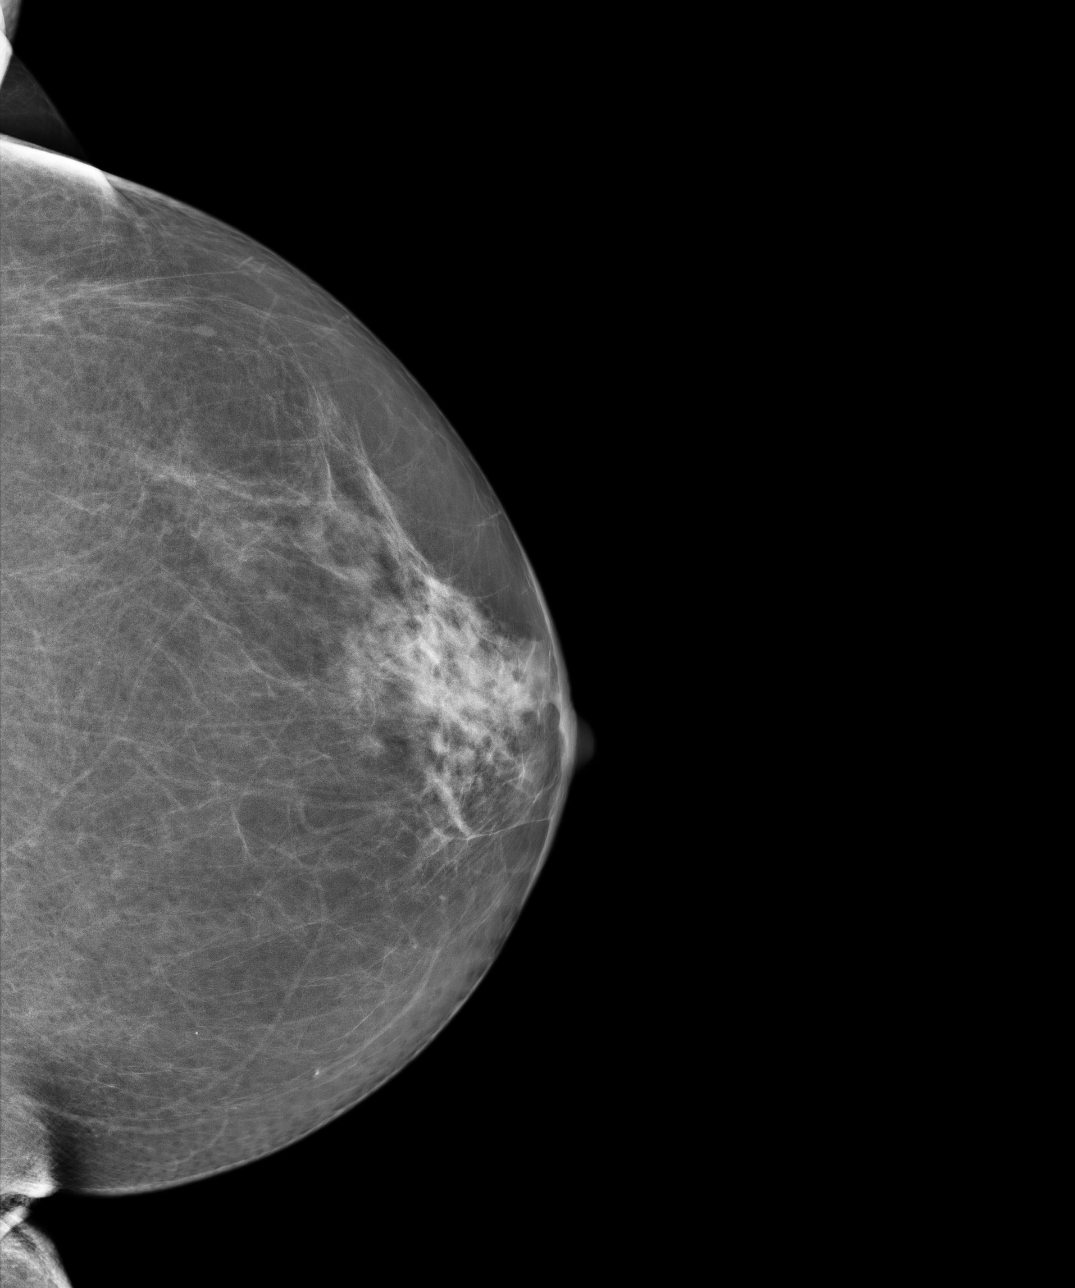
[im 3/4]
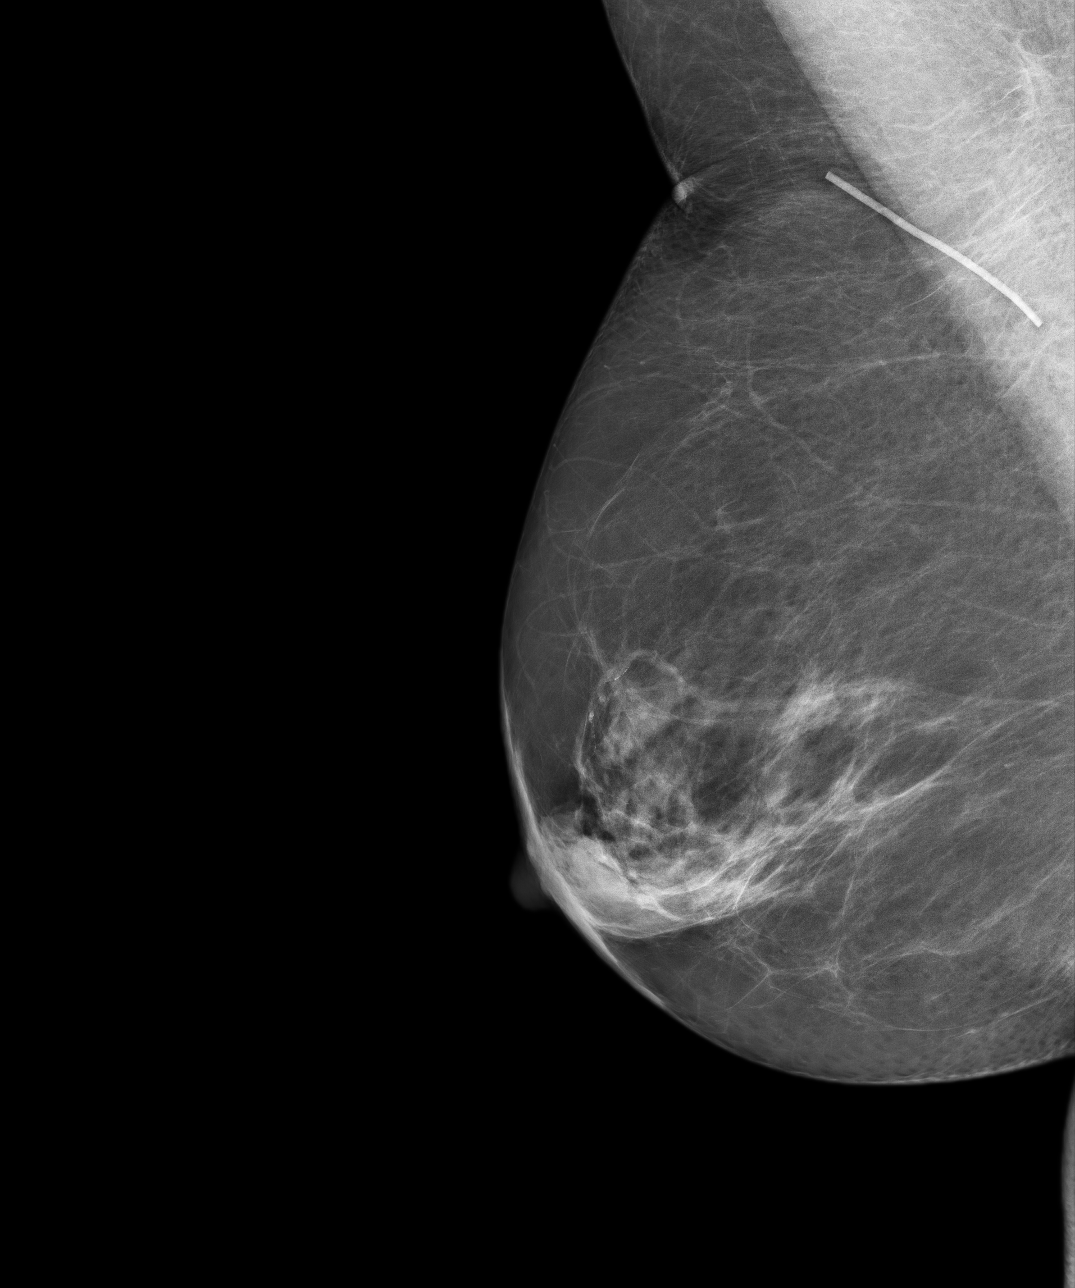
[im 4/4]
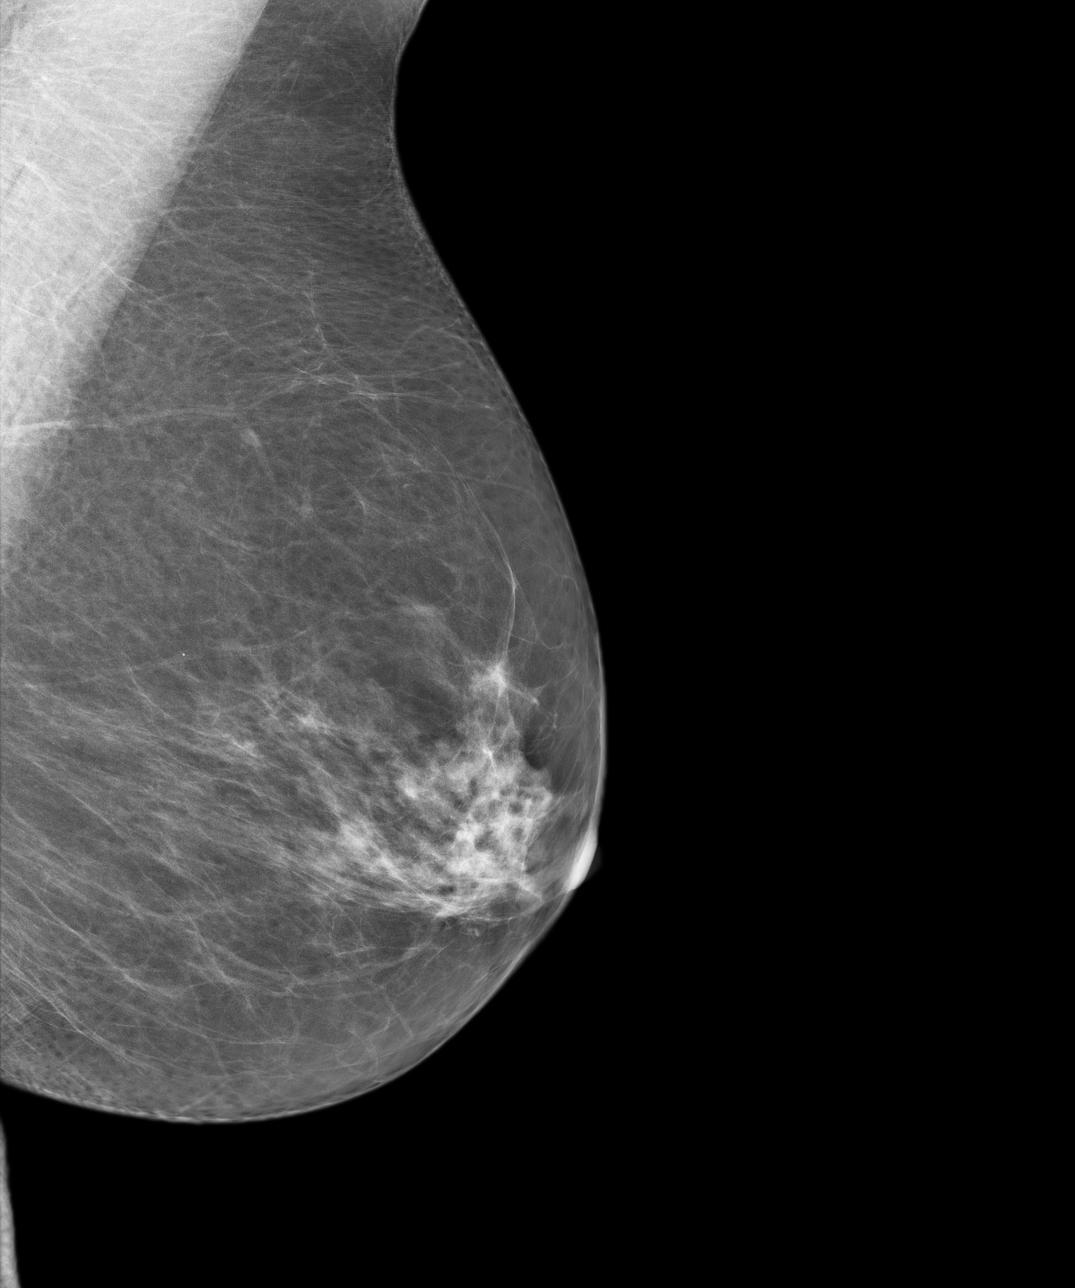

[4 of 4 positions shown; findings below may reference images not displayed]

IMPRESSION: Benign exam. Year followup exam suggested.

BI-RADS: Category 2- Benign Finding

A NEGATIVE MAMMOGRAM REPORT DOES NOT PRECLUDE BIOPSY OR OTHER EVALUATION OF
A CLINICALLY PALPABLE OR OTHERWISE SUSPICIOUS MASS OR LESION. BREAST CANCER
MAY NOT BE DETECTED IN UP TO 10% OF CASES.

Thank you for the oppurtunity to contribute to the care of your patient. .

BREAST COMPOSITION: The breast composition is SCATTERED FIBROGLANDULAR
TISSUE (glandular tissue is 25-50%)

## 2013-10-07 ENCOUNTER — Ambulatory Visit: Payer: Self-pay

## 2013-10-07 IMAGING — MG MM DIGITAL SCREENING BILAT W/ CAD
1 series · 5 of 5 positions shown · non-contrast
Comparison: Previous exam(s).

CLINICAL DATA: Screening.

EXAM:
DIGITAL SCREENING BILATERAL MAMMOGRAM WITH CAD

[R CC · right · 5 of 5 slices shown]
[im 1/5]
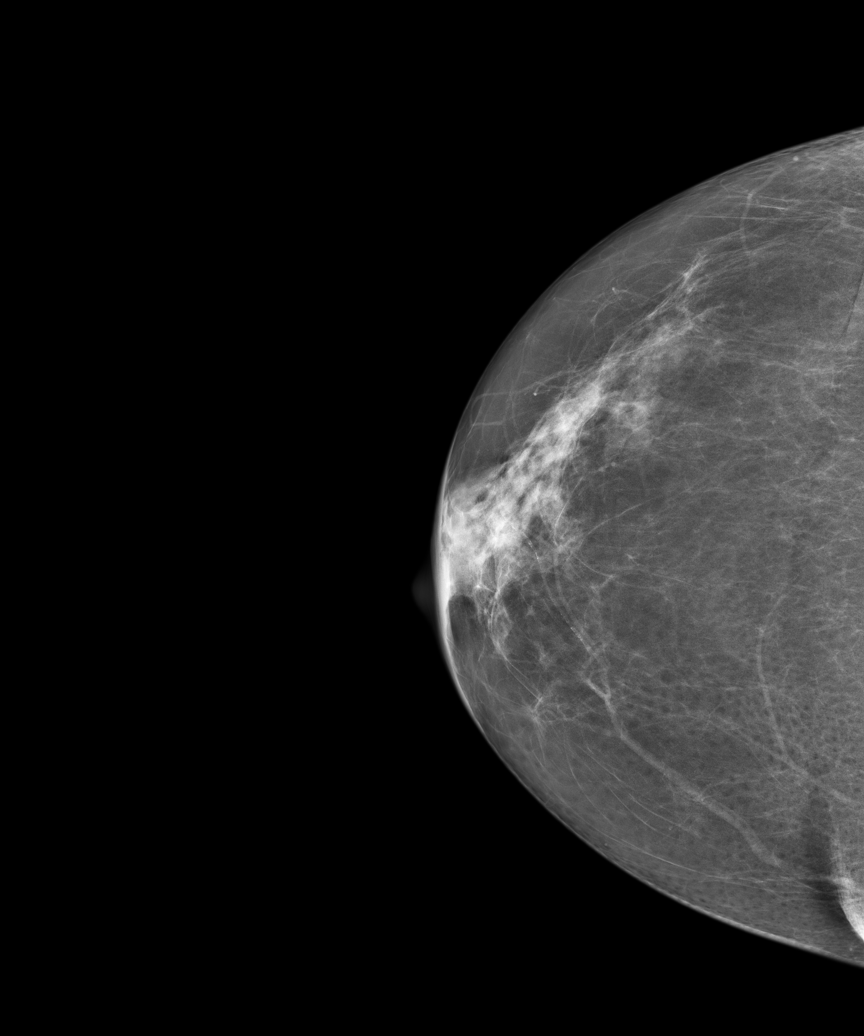
[im 2/5]
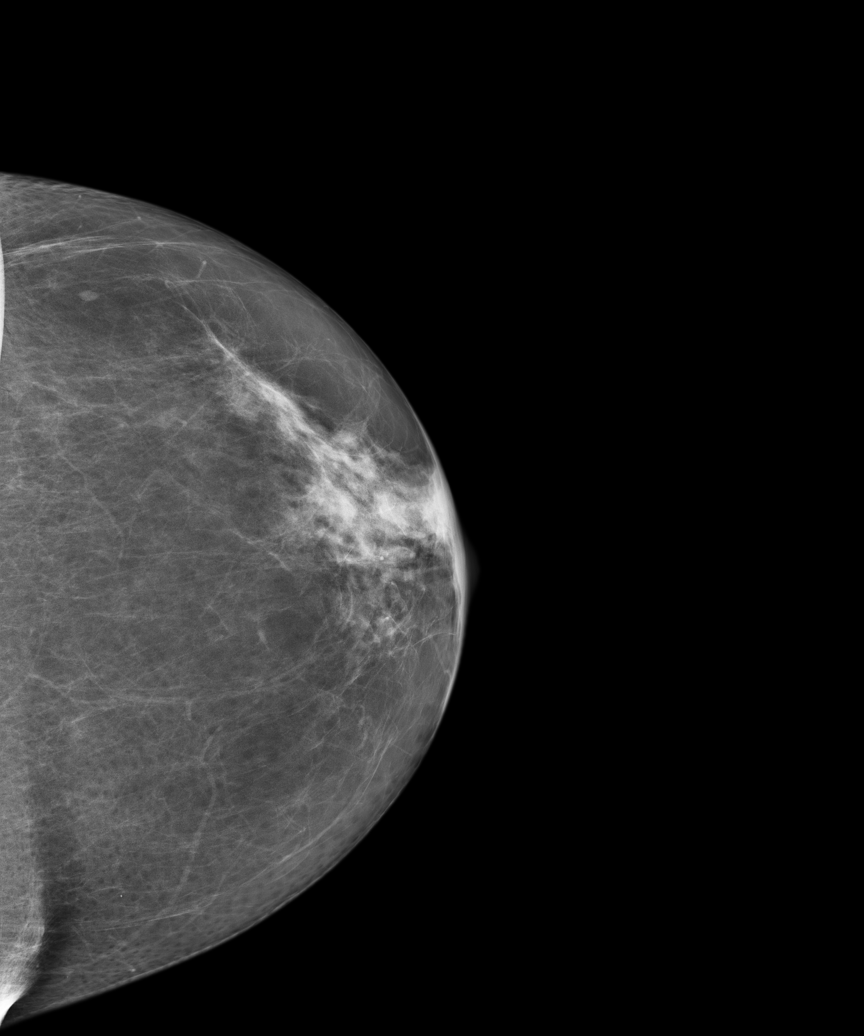
[im 3/5]
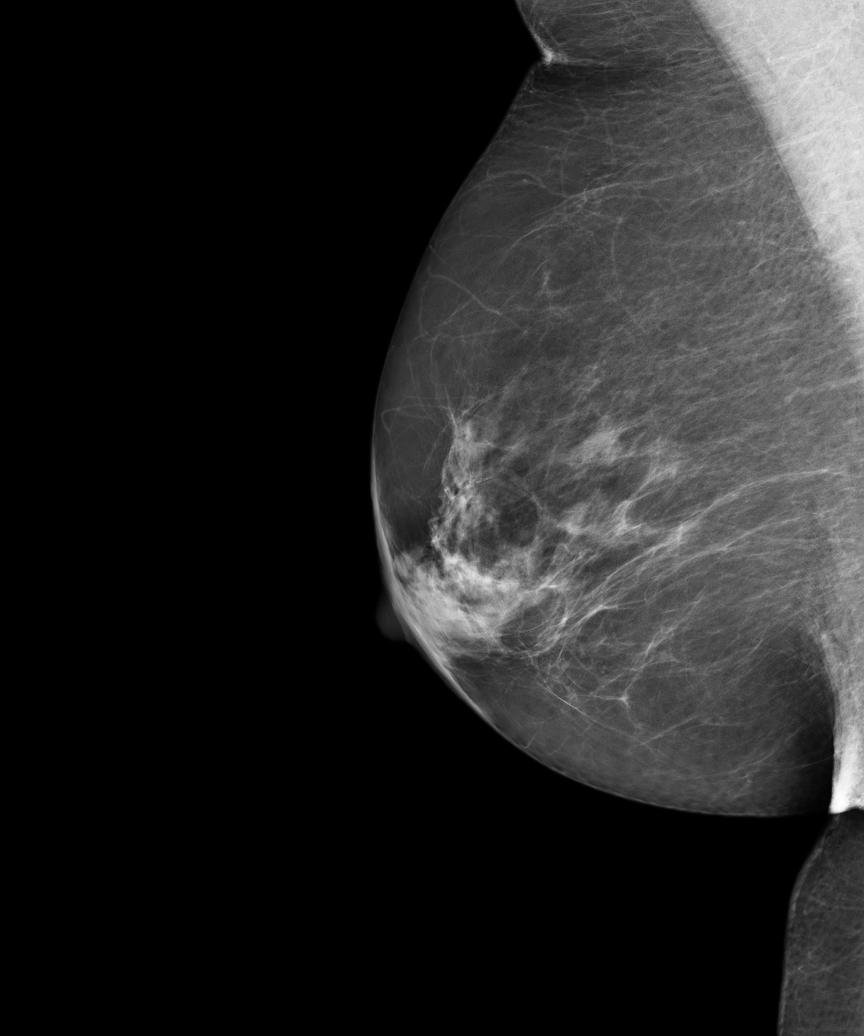
[im 4/5]
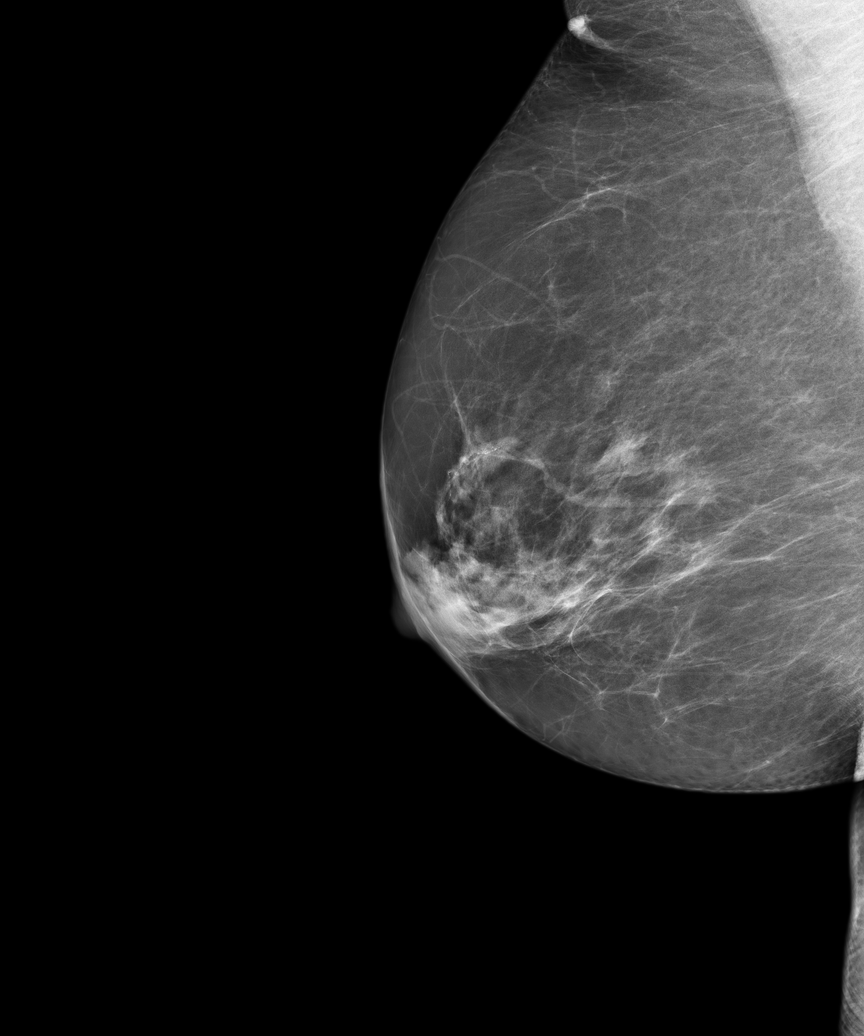
[im 5/5]
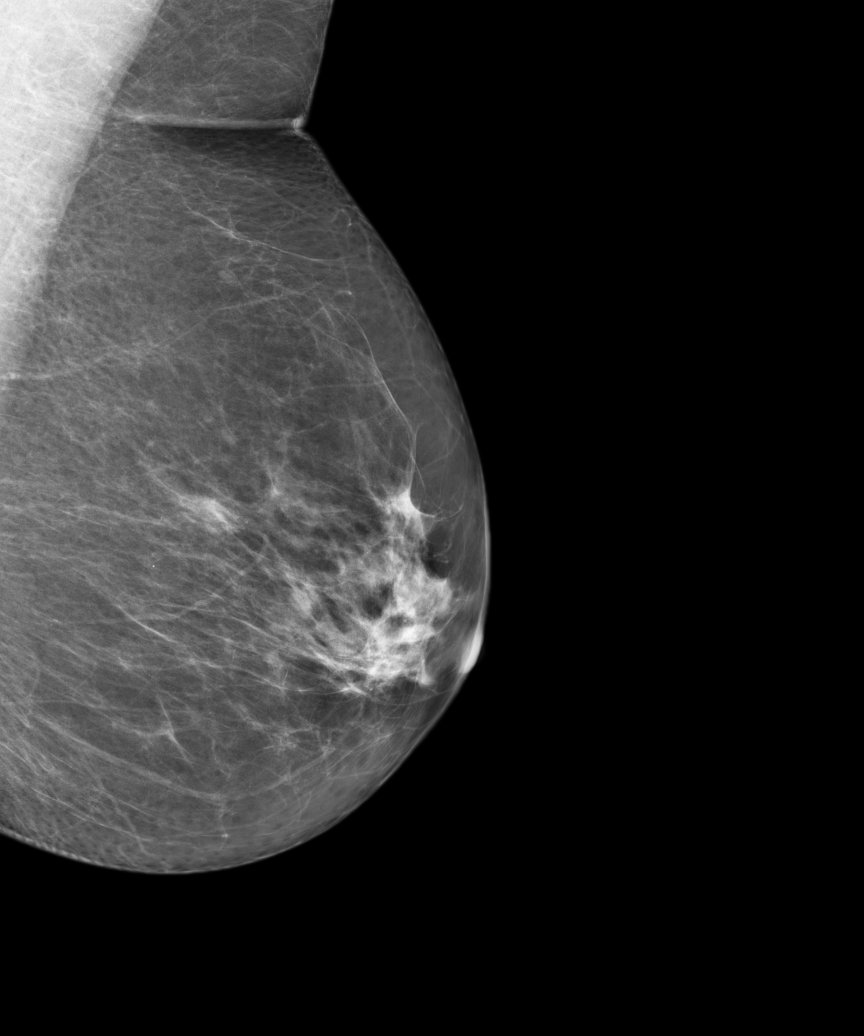

[5 of 5 positions shown; findings below may reference images not displayed]

ACR Breast Density Category b: There are scattered areas of
fibroglandular density.
FINDINGS: There are no findings suspicious for malignancy. Images were
processed with CAD.
IMPRESSION: No mammographic evidence of malignancy. A result letter of this
screening mammogram will be mailed directly to the patient.

RECOMMENDATION:
Screening mammogram in one year. (Code:[[place]])

BI-RADS CATEGORY  1: Negative.

## 2014-07-23 ENCOUNTER — Emergency Department: Payer: Self-pay | Admitting: Emergency Medicine

## 2014-07-23 LAB — URINALYSIS, COMPLETE
BLOOD: NEGATIVE
Bacteria: NONE SEEN
Bilirubin,UR: NEGATIVE
Glucose,UR: NEGATIVE mg/dL (ref 0–75)
Hyaline Cast: 3
LEUKOCYTE ESTERASE: NEGATIVE
Nitrite: NEGATIVE
PH: 5 (ref 4.5–8.0)
Protein: 30
RBC,UR: 1 /HPF (ref 0–5)
SPECIFIC GRAVITY: 1.023 (ref 1.003–1.030)
Squamous Epithelial: 1
WBC UR: 4 /HPF (ref 0–5)

## 2014-07-23 LAB — COMPREHENSIVE METABOLIC PANEL
ALT: 32 U/L
AST: 23 U/L (ref 15–37)
Albumin: 2.6 g/dL — ABNORMAL LOW (ref 3.4–5.0)
Alkaline Phosphatase: 189 U/L — ABNORMAL HIGH
Anion Gap: 7 (ref 7–16)
BUN: 14 mg/dL (ref 7–18)
Bilirubin,Total: 0.9 mg/dL (ref 0.2–1.0)
CREATININE: 1.03 mg/dL (ref 0.60–1.30)
Calcium, Total: 8.7 mg/dL (ref 8.5–10.1)
Chloride: 105 mmol/L (ref 98–107)
Co2: 26 mmol/L (ref 21–32)
EGFR (African American): >60
EGFR (Non-African Amer.): 54 — ABNORMAL LOW
Glucose: 98 mg/dL (ref 65–99)
OSMOLALITY: 276 (ref 275–301)
Potassium: 3.6 mmol/L (ref 3.5–5.1)
Sodium: 138 mmol/L (ref 136–145)
Total Protein: 6.6 g/dL (ref 6.4–8.2)

## 2014-07-23 LAB — CBC
HCT: 34.8 % — AB (ref 35.0–47.0)
HGB: 11.2 g/dL — AB (ref 12.0–16.0)
MCH: 30 pg (ref 26.0–34.0)
MCHC: 32.3 g/dL (ref 32.0–36.0)
MCV: 93 fL (ref 80–100)
Platelet: 293 10*3/uL (ref 150–440)
RBC: 3.75 10*6/uL — ABNORMAL LOW (ref 3.80–5.20)
RDW: 13.9 % (ref 11.5–14.5)
WBC: 13.2 10*3/uL — ABNORMAL HIGH (ref 3.6–11.0)

## 2014-07-23 LAB — LIPASE, BLOOD: Lipase: 82 U/L (ref 73–393)

## 2014-07-23 IMAGING — CT CT ABD-PELV W/ CM
2 of 5 series · 16 of 46 positions shown, 18 images · IV contrast (isovue)
Comparison: [DATE]

CLINICAL DATA: 86-year-old with elevated white blood cell count,
left lower quadrant pain x3 weeks

EXAM:
CT ABDOMEN AND PELVIS WITH CONTRAST
TECHNIQUE: Multidetector CT imaging of the abdomen and pelvis was performed
using the standard protocol following bolus administration of
intravenous contrast.
CONTRAST:  100 mL of Isovue 370 was administered intravenously

[Series 2: routine abd pel with · axial · 0.65mm/px · z∈[+18,+408]mm · 13 of 88 slices shown, 15 images]
[im 5/88  soft-tissue]
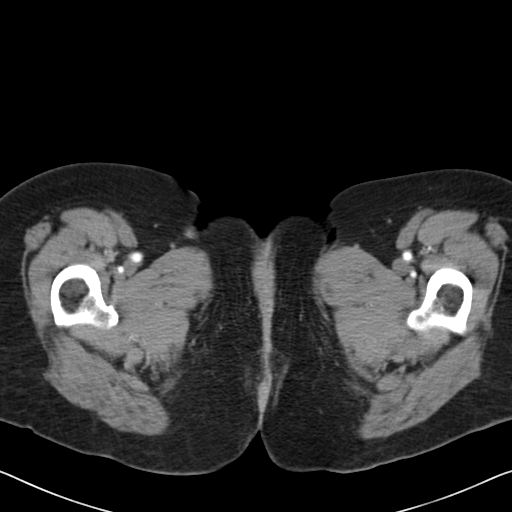
[im 5/88  bone]
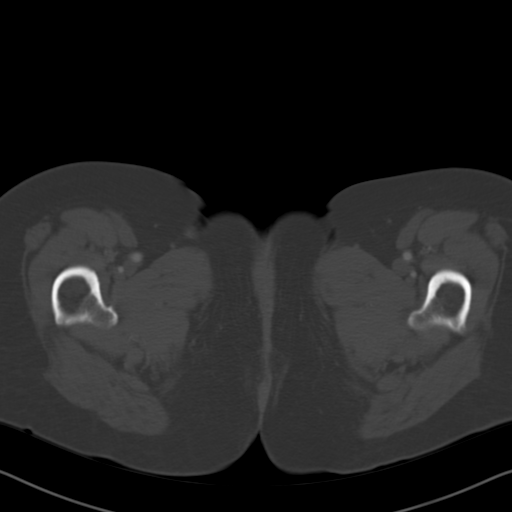
[im 14/88  soft-tissue]
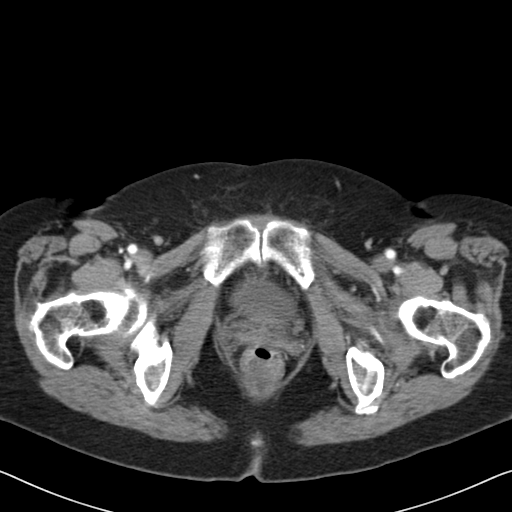
[im 19/88  soft-tissue]
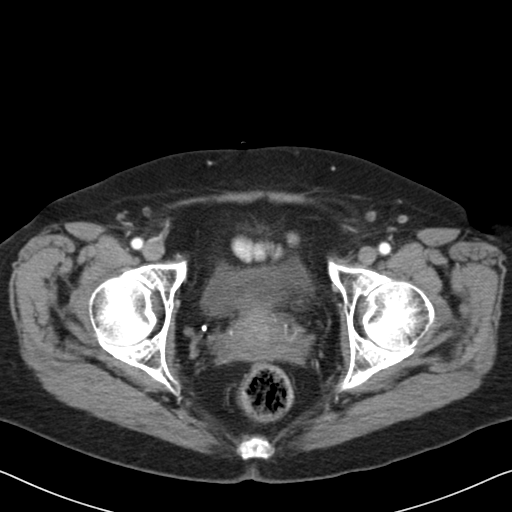
[im 23/88  soft-tissue]
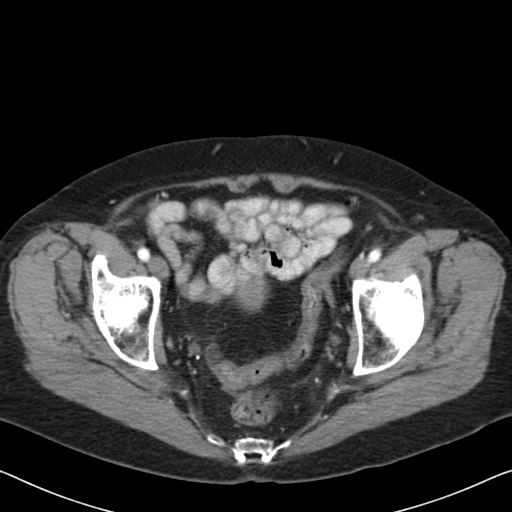
[im 33/88  soft-tissue]
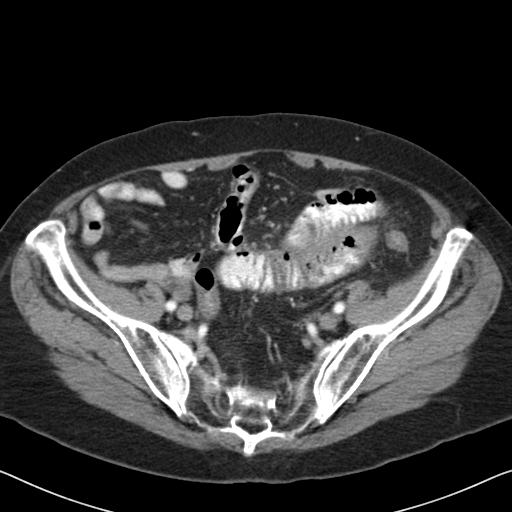
[im 37/88  soft-tissue]
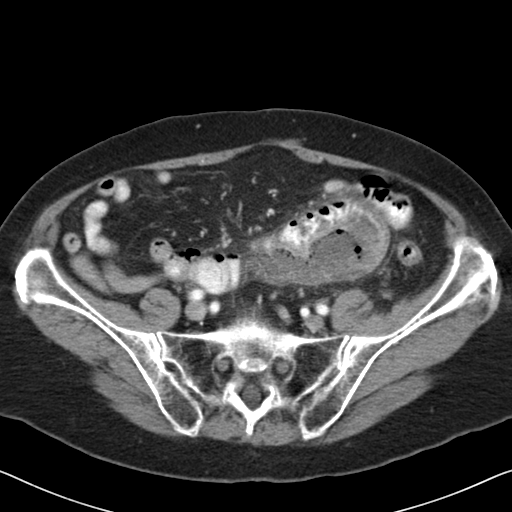
[im 46/88  soft-tissue]
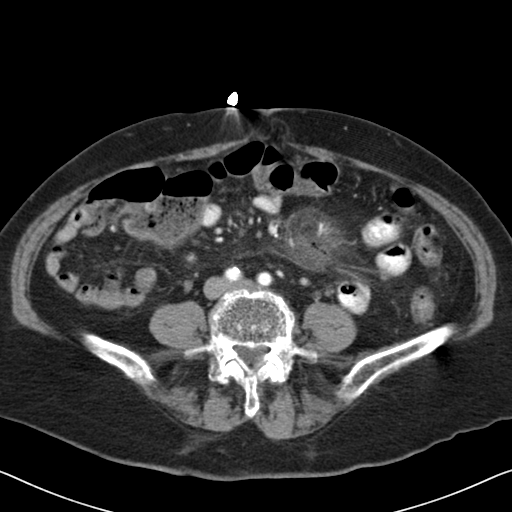
[im 51/88  soft-tissue]
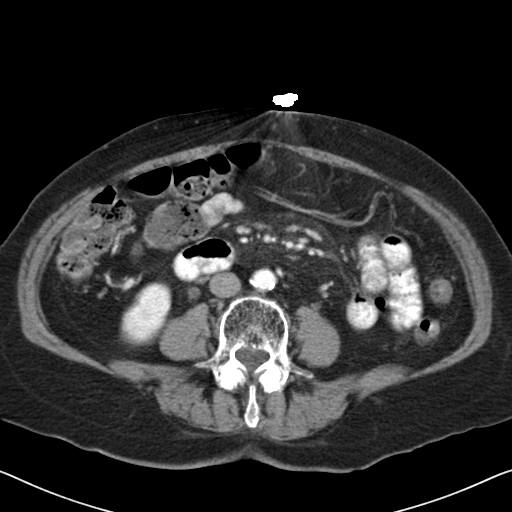
[im 55/88  soft-tissue]
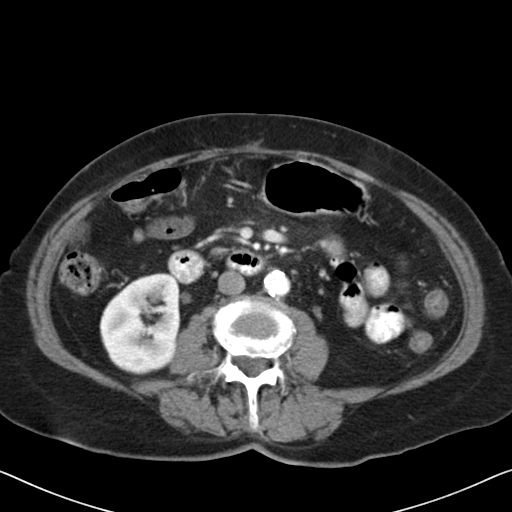
[im 55/88  bone]
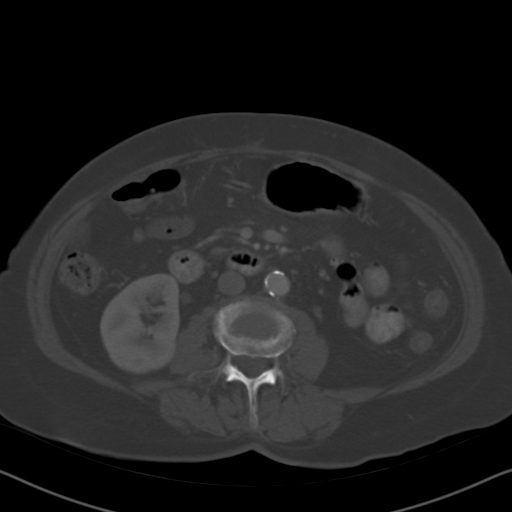
[im 65/88  soft-tissue]
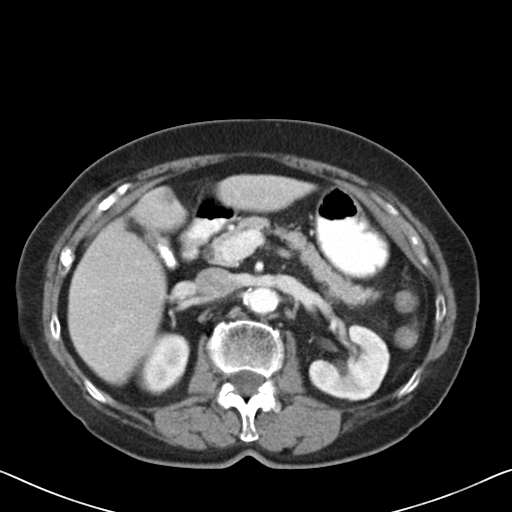
[im 69/88  soft-tissue]
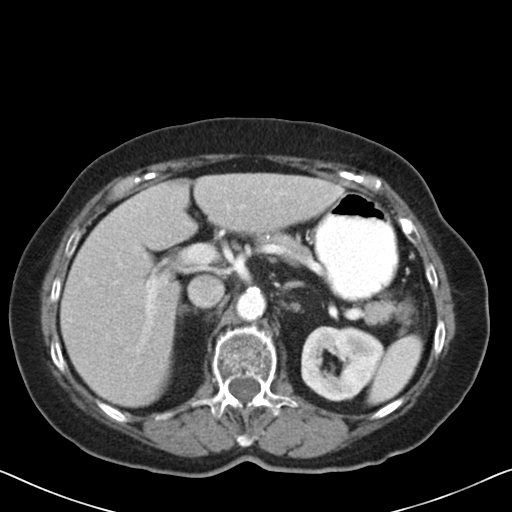
[im 74/88  soft-tissue]
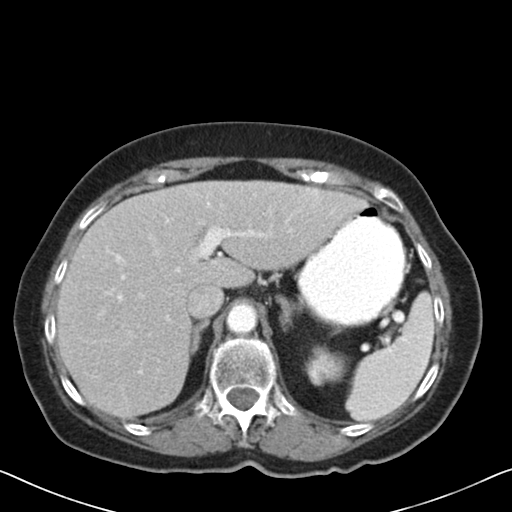
[im 83/88  soft-tissue]
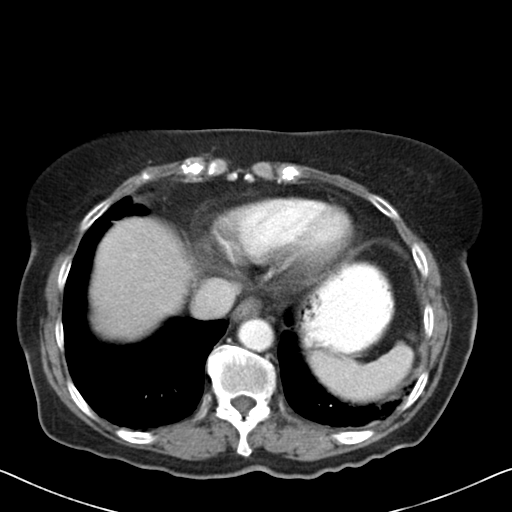

[Series 6: cor routine abd pel with · coronal · 0.67mm/px · 3 of 121 slices shown]
[im 41/121  soft-tissue]
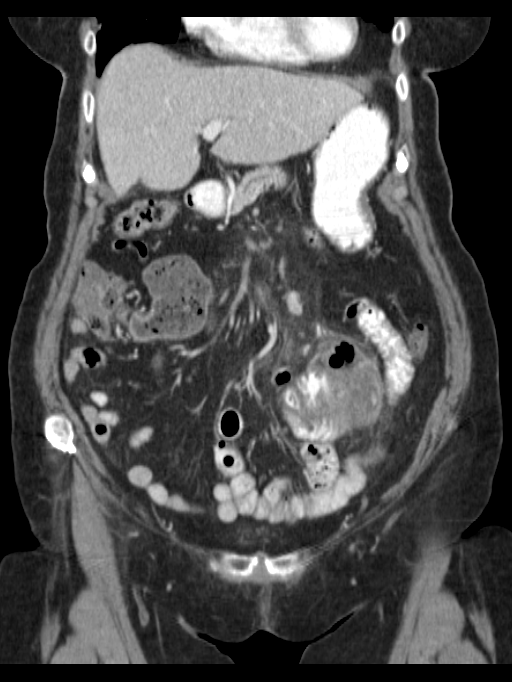
[im 54/121  soft-tissue]
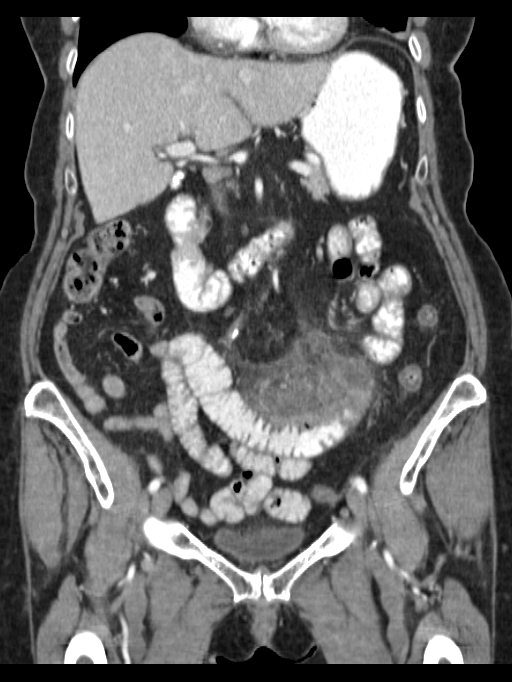
[im 67/121  soft-tissue]
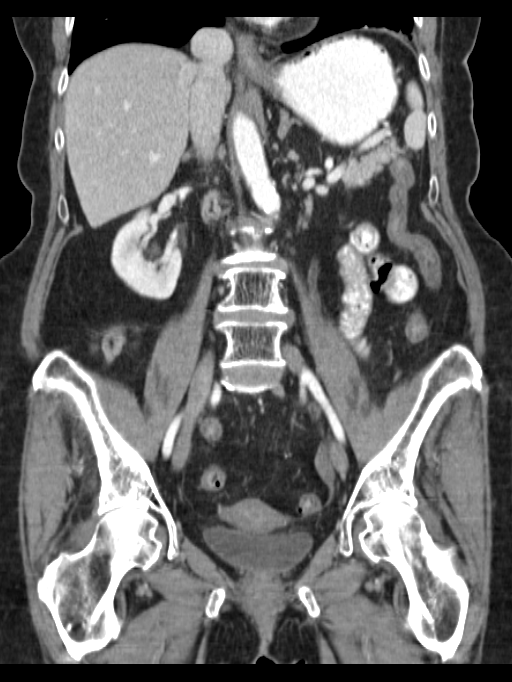

[16 of 46 positions shown; findings below may reference images not displayed]

FINDINGS: Chest:Scattered areas of atelectasis are noted in the visualized
lung bases. No suspicious pulmonary nodule or mass is seen in the
visualized lung bases.

Liver: There is no suspicious hepatic mass. There is no intrahepatic
biliary ductal dilatation.

Gallbladder: Prominent gallstones are noted within a contracted
gallbladder. There is no gallbladder wall thickening. There is no
extrahepatic biliary ductal dilatation.

Spleen: Unremarkable.

Pancreas: Unremarkable.

Adrenal glands: Unremarkable.

Kidneys: There is no nephrolithiasis or hydronephrosis. No
suspicious renal mass is seen.

Bowel/gastrointestinal tract: A focal area of severe inflammatory
changes are noted about several loops of small bowel within the
central/ left lower quadrant measuring up to 10.0 x 5.0 cm.
Associated wall thickening is present. There is no free air. There
is no definite abscess or underlying mass. There is no definite
pneumatosis. The colon is decompressed.

Pelvis: There is no pelvic mass.  The uterus is unremarkable.

Miscellaneous: The urinary bladder is unremarkable. There is no
intra-abdominal or retroperitoneal adenopathy. Scattered vascular
calcifications are present.

Osseous structures: Degenerative changes of the spine are noted.
Prominent Schmorl's node is seen involving the inferior endplate of
L3.
IMPRESSION: 1. Findings are most compatible with acute focal enteritis as
described above. No definite current abscess or evidence of an
underlying mass. Follow-up is recommended.
2. Cholelithiasis.

## 2015-03-02 ENCOUNTER — Other Ambulatory Visit: Payer: Self-pay | Admitting: Internal Medicine

## 2015-03-02 DIAGNOSIS — Z1231 Encounter for screening mammogram for malignant neoplasm of breast: Secondary | ICD-10-CM

## 2015-03-08 ENCOUNTER — Other Ambulatory Visit: Payer: Self-pay | Admitting: Internal Medicine

## 2015-03-08 ENCOUNTER — Ambulatory Visit
Admission: RE | Admit: 2015-03-08 | Discharge: 2015-03-08 | Disposition: A | Discharge status: Home / Self Care | Payer: Medicare Other | Source: Ambulatory Visit | Attending: Internal Medicine | Admitting: Internal Medicine

## 2015-03-08 DIAGNOSIS — Z1231 Encounter for screening mammogram for malignant neoplasm of breast: Secondary | ICD-10-CM

## 2015-03-08 HISTORY — DX: Personal history of antineoplastic chemotherapy: Z92.21

## 2015-03-08 HISTORY — DX: Personal history of irradiation: Z92.3

## 2015-03-08 HISTORY — DX: Malignant (primary) neoplasm, unspecified: C80.1

## 2015-03-08 IMAGING — MG MM SCREENING BREAST TOMO BILATERAL
9 of 13 series · 9 of 29 positions shown · non-contrast
Comparison: Previous exam(s).

CLINICAL DATA: Screening.

EXAM:
DIGITAL SCREENING BILATERAL MAMMOGRAM WITH 3D TOMO WITH CAD

[L MLO (1 of 2)]
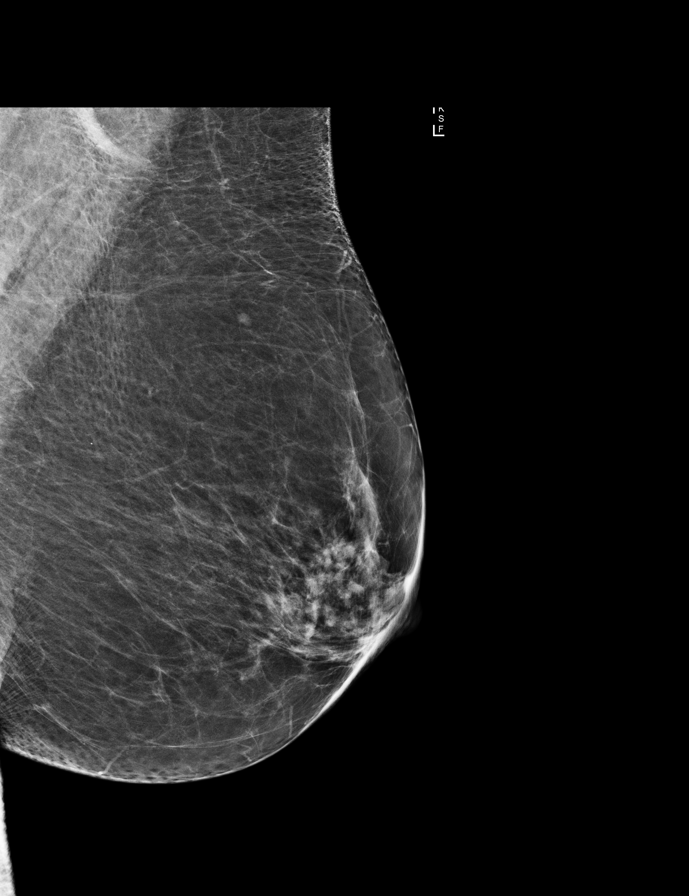

[R CC]
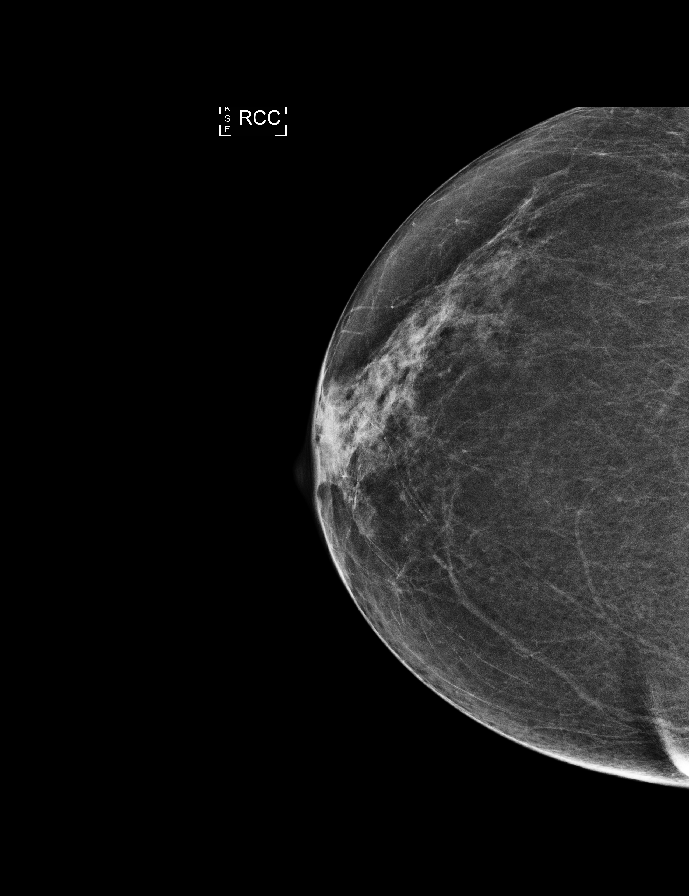

[L CC synth-2D]
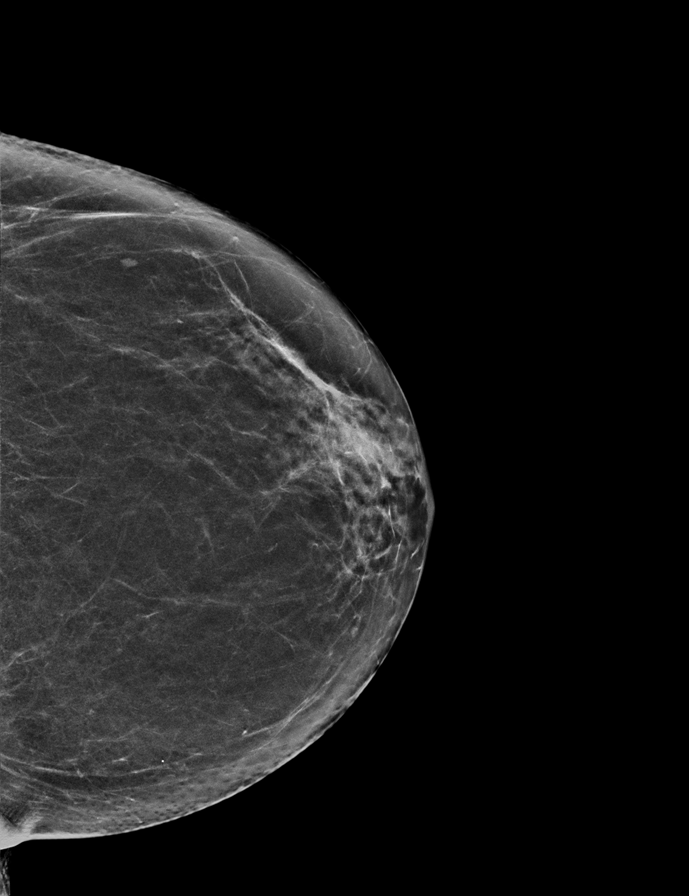

[R MLO]
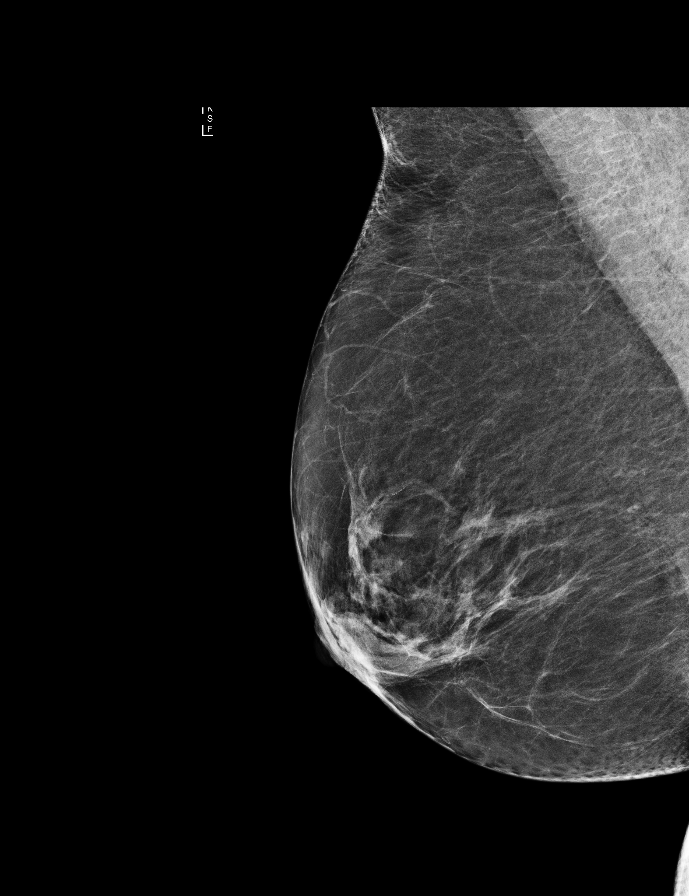

[L CC]
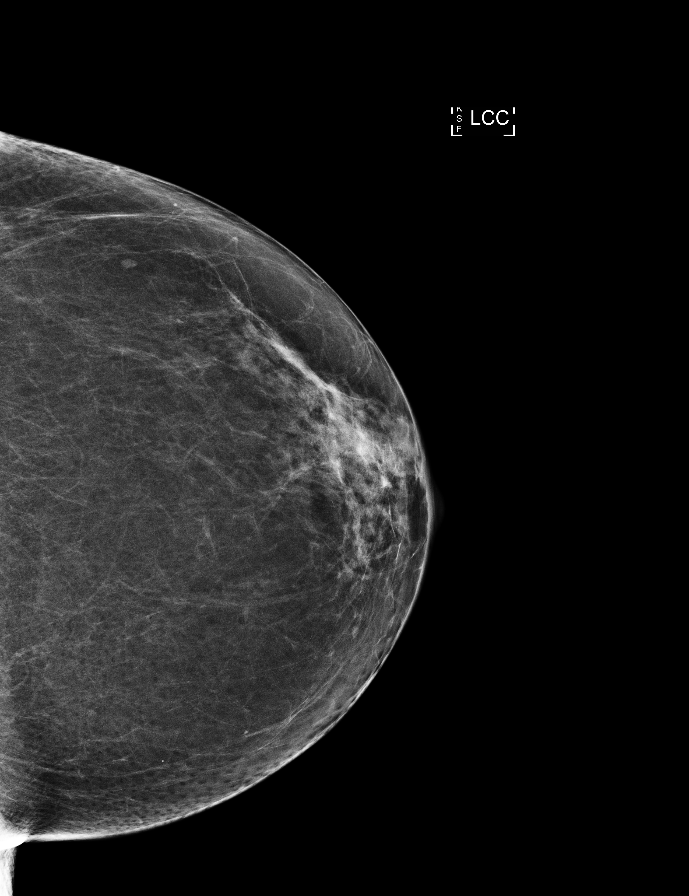

[L MLO synth-2D]
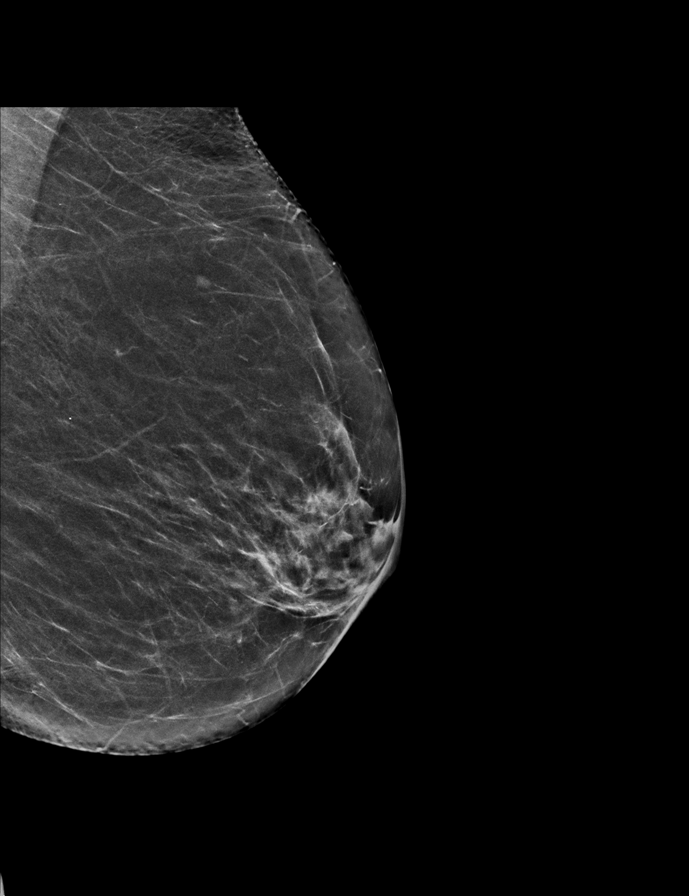

[R MLO synth-2D]
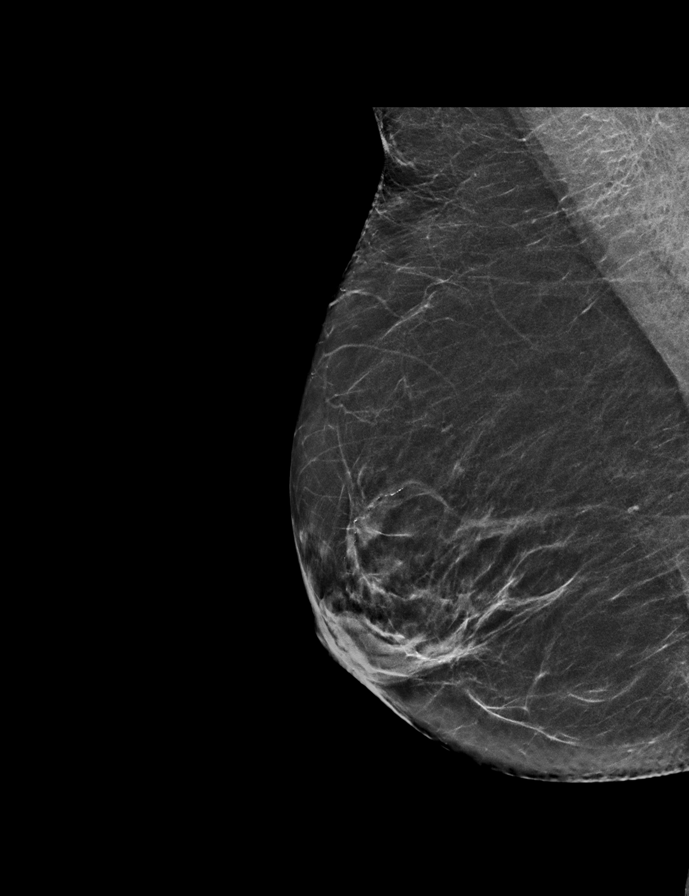

[R CC synth-2D]
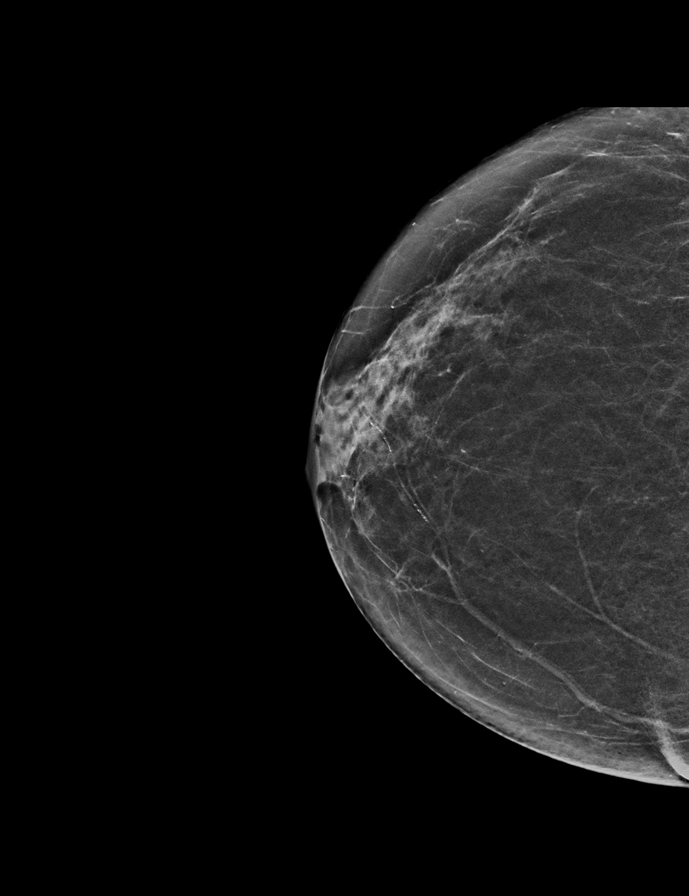

[L MLO (2 of 2)]
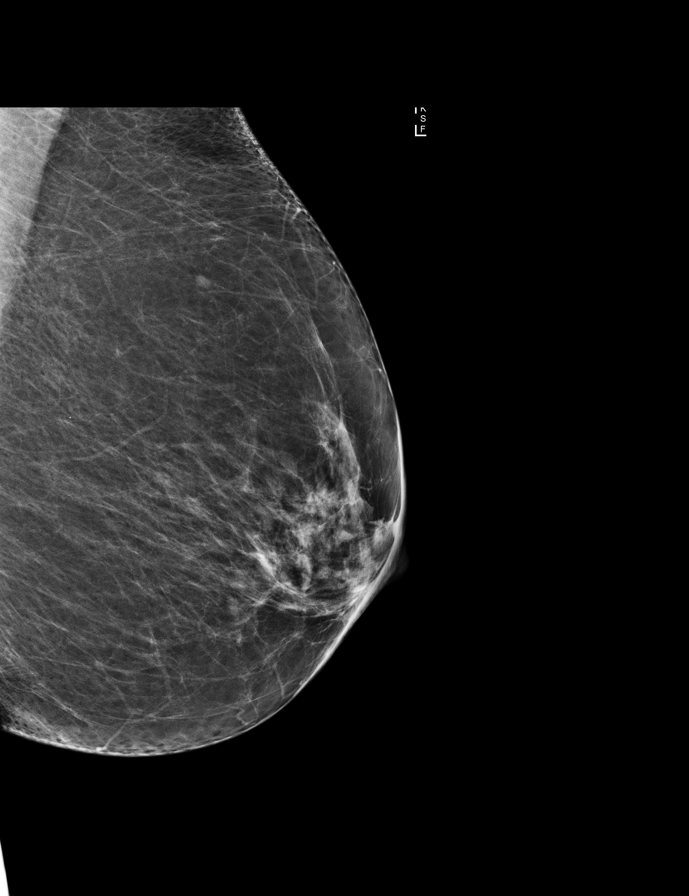

[9 of 29 positions shown; findings below may reference images not displayed]

ACR Breast Density Category b: There are scattered areas of
fibroglandular density.
FINDINGS: There are no findings suspicious for malignancy. Images were
processed with CAD.
IMPRESSION: No mammographic evidence of malignancy. A result letter of this
screening mammogram will be mailed directly to the patient.

RECOMMENDATION:
Screening mammogram in one year. (Code:[F0])

BI-RADS CATEGORY  1: Negative.

## 2016-02-06 ENCOUNTER — Other Ambulatory Visit: Payer: Self-pay | Admitting: Internal Medicine

## 2016-02-06 DIAGNOSIS — Z1231 Encounter for screening mammogram for malignant neoplasm of breast: Secondary | ICD-10-CM

## 2016-03-08 ENCOUNTER — Other Ambulatory Visit: Payer: Self-pay | Admitting: Internal Medicine

## 2016-03-08 ENCOUNTER — Ambulatory Visit
Admission: RE | Admit: 2016-03-08 | Discharge: 2016-03-08 | Disposition: A | Discharge status: Home / Self Care | Payer: Medicare Other | Source: Ambulatory Visit | Attending: Internal Medicine | Admitting: Internal Medicine

## 2016-03-08 DIAGNOSIS — Z1231 Encounter for screening mammogram for malignant neoplasm of breast: Secondary | ICD-10-CM | POA: Insufficient documentation

## 2016-03-08 IMAGING — MG MM DIGITAL SCREENING BILAT W/ TOMO W/ CAD
9 of 12 series · 9 of 28 positions shown · non-contrast
Comparison: Previous exam(s).

CLINICAL DATA: Screening.

EXAM:
2D DIGITAL SCREENING BILATERAL MAMMOGRAM WITH CAD AND ADJUNCT TOMO

[R CC]
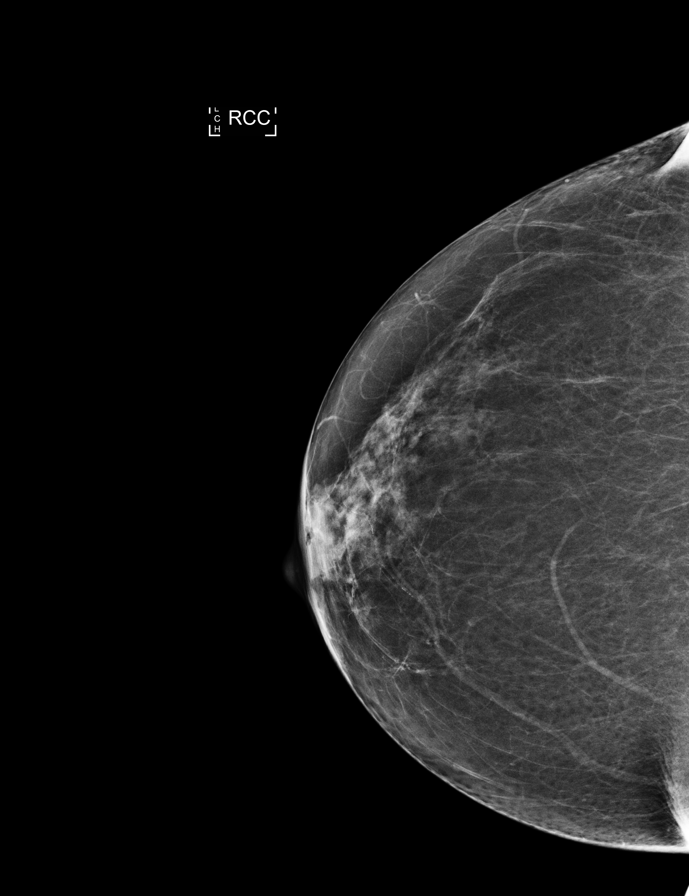

[L MLO synth-2D]
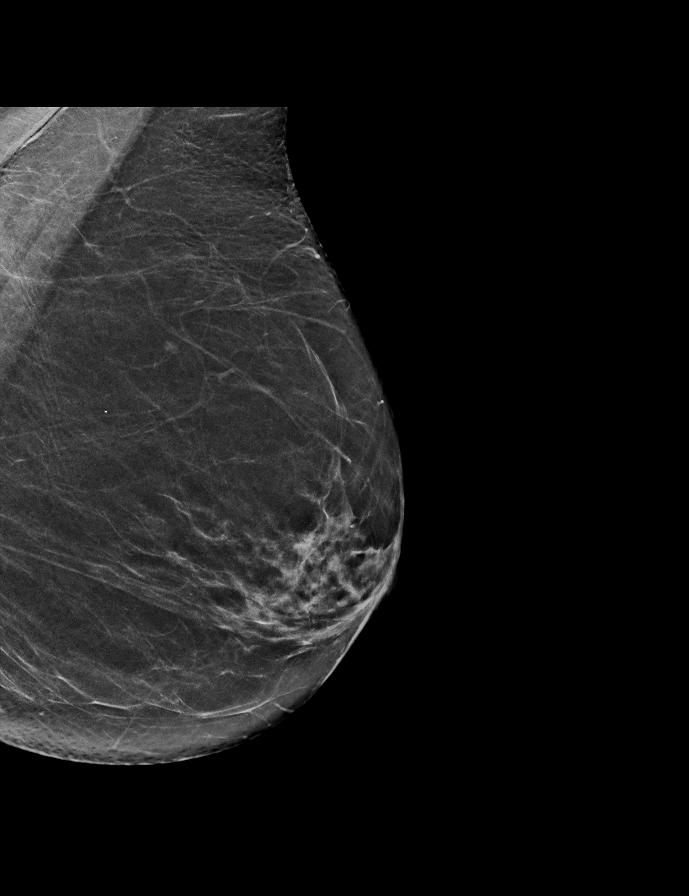

[L CC synth-2D]
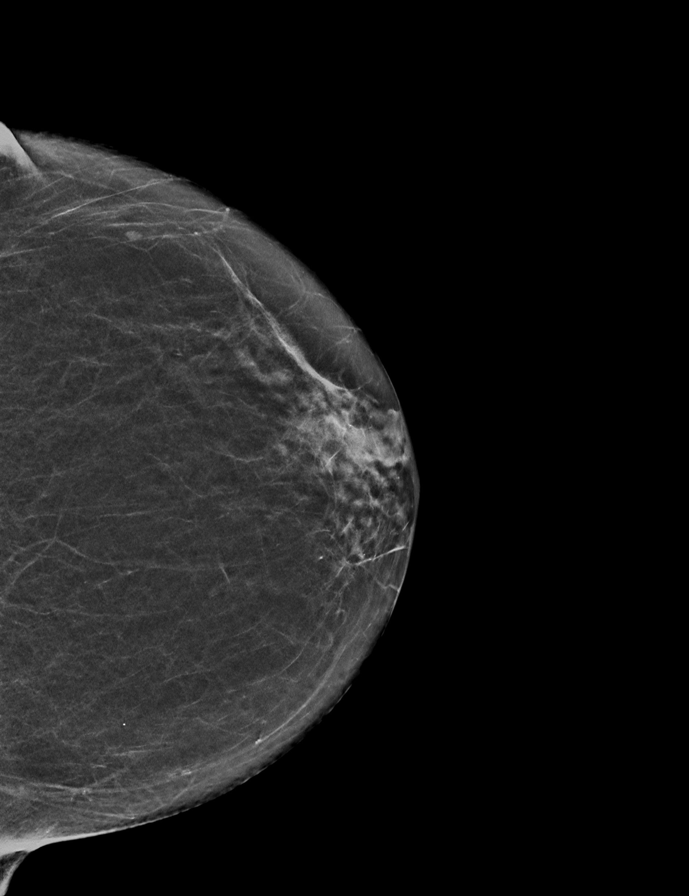

[R MLO]
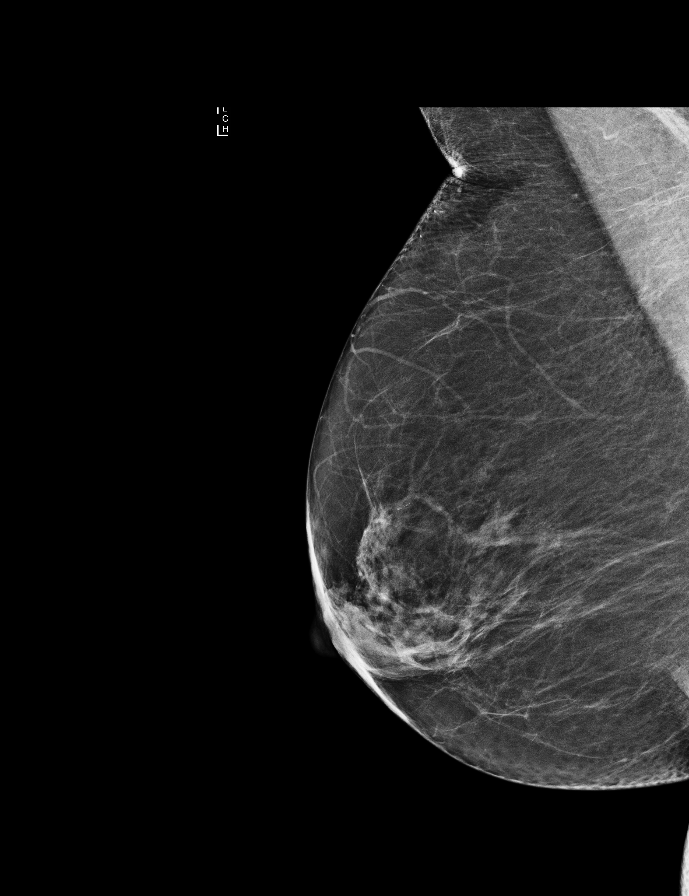

[R MLO synth-2D]
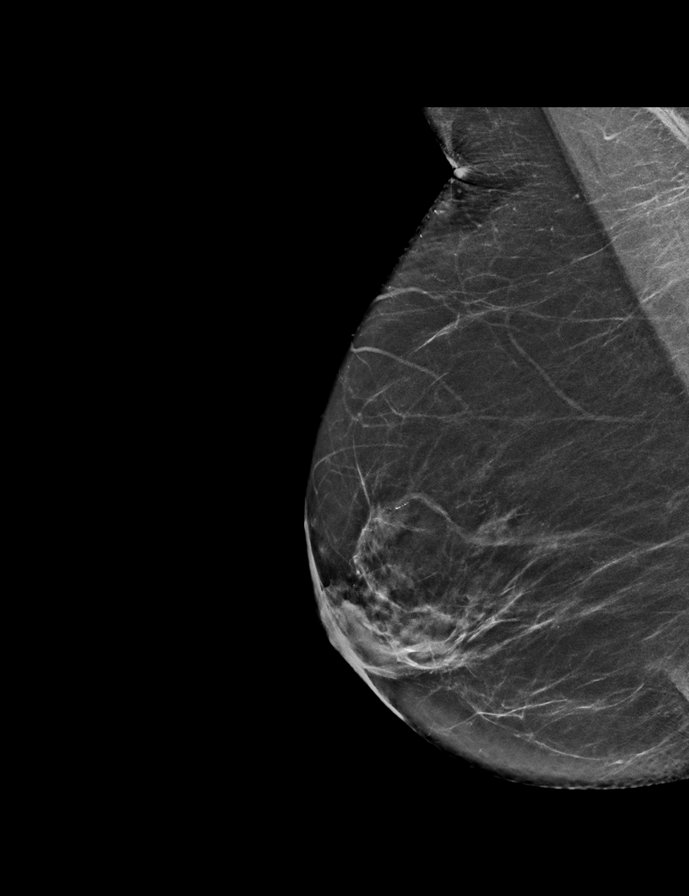

[L CC]
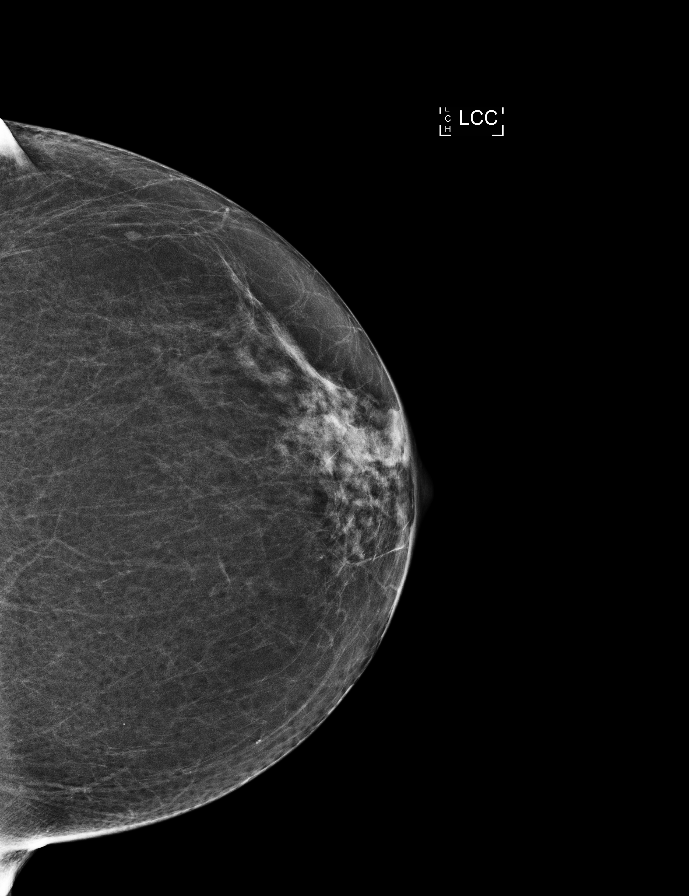

[R CC synth-2D]
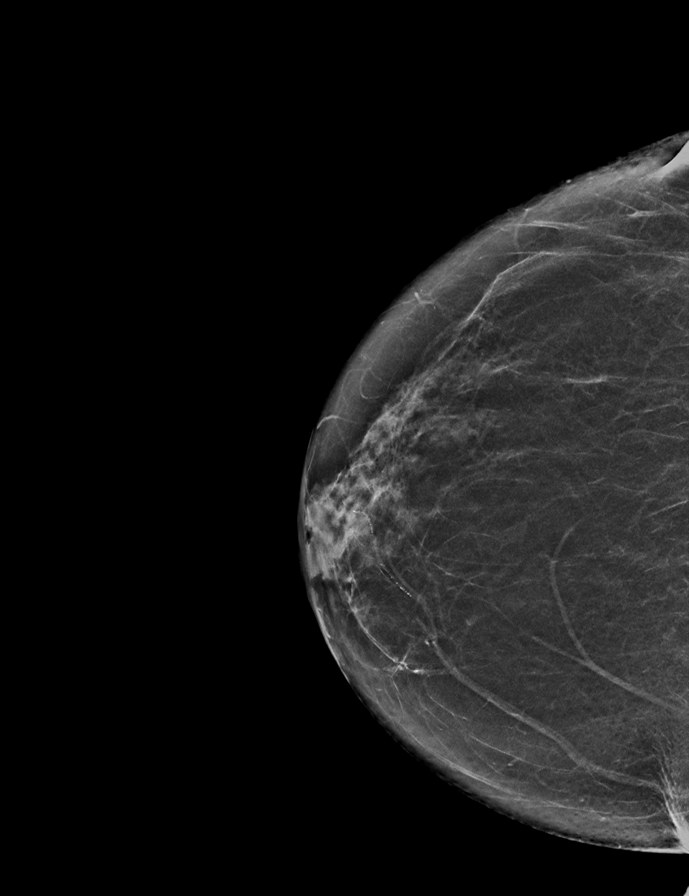

[L MLO]
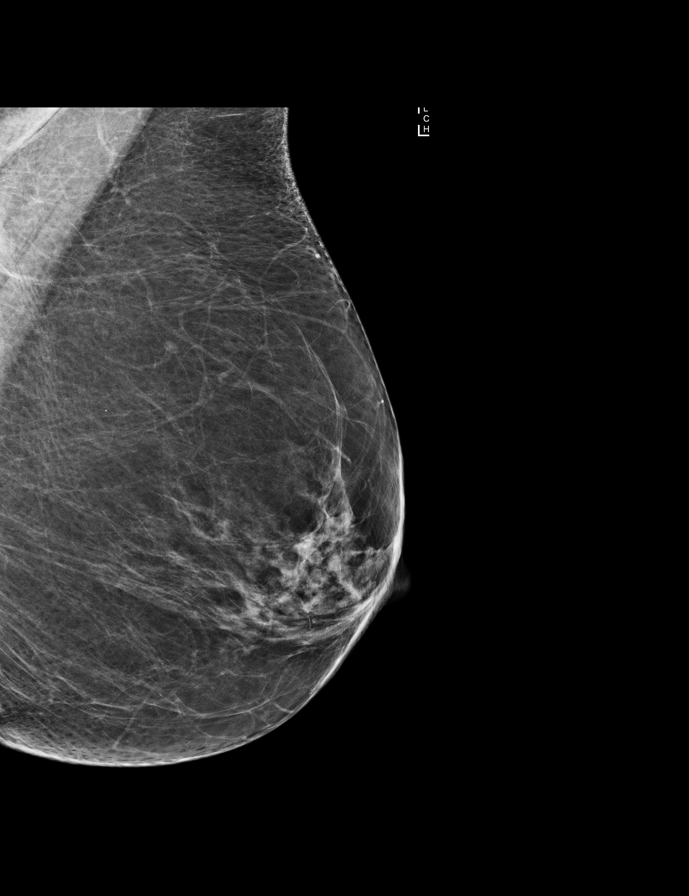

[R MLO tomo · tomo slice 29/58.0]
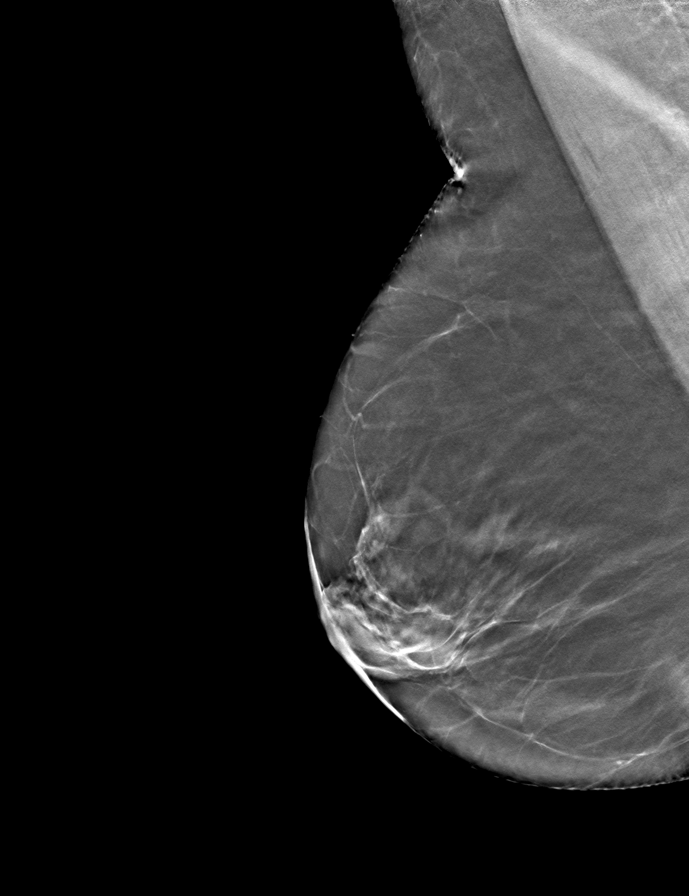

[9 of 28 positions shown; findings below may reference images not displayed]

ACR Breast Density Category c: The breast tissue is heterogeneously
dense, which may obscure small masses.
FINDINGS: There are no findings suspicious for malignancy. Images were
processed with CAD.
IMPRESSION: No mammographic evidence of malignancy. A result letter of this
screening mammogram will be mailed directly to the patient.

RECOMMENDATION:
Screening mammogram in one year. (Code:[TA])

BI-RADS CATEGORY  1: Negative.

## 2016-05-14 ENCOUNTER — Other Ambulatory Visit
Admission: RE | Admit: 2016-05-14 | Discharge: 2016-05-14 | Disposition: A | Discharge status: Home / Self Care | Payer: Medicare Other | Source: Ambulatory Visit | Attending: Physician Assistant | Admitting: Physician Assistant

## 2016-05-14 ENCOUNTER — Ambulatory Visit
Admission: RE | Admit: 2016-05-14 | Discharge: 2016-05-14 | Disposition: A | Discharge status: Home / Self Care | Payer: Medicare Other | Source: Ambulatory Visit | Attending: Physician Assistant | Admitting: Physician Assistant

## 2016-05-14 ENCOUNTER — Other Ambulatory Visit: Payer: Self-pay | Admitting: Physician Assistant

## 2016-05-14 DIAGNOSIS — I700 Atherosclerosis of aorta: Secondary | ICD-10-CM | POA: Insufficient documentation

## 2016-05-14 DIAGNOSIS — K8010 Calculus of gallbladder with chronic cholecystitis without obstruction: Secondary | ICD-10-CM | POA: Insufficient documentation

## 2016-05-14 DIAGNOSIS — K810 Acute cholecystitis: Secondary | ICD-10-CM | POA: Diagnosis present

## 2016-05-14 DIAGNOSIS — R1011 Right upper quadrant pain: Secondary | ICD-10-CM

## 2016-05-14 LAB — FIBRIN DERIVATIVES D-DIMER (ARMC ONLY): Fibrin derivatives D-dimer (ARMC): 975 — ABNORMAL HIGH (ref 0–499)

## 2016-05-14 LAB — TROPONIN I

## 2016-05-14 LAB — POCT I-STAT CREATININE: CREATININE: 1.1 mg/dL — AB (ref 0.44–1.00)

## 2016-05-14 IMAGING — CT CT ABD-PELV W/ CM
2 of 5 series · 16 of 46 positions shown, 18 images · IV contrast (APPLIED)
Comparison: [DATE]

CLINICAL DATA: Right upper quadrant pain and vomiting.

EXAM:
CT ABDOMEN AND PELVIS WITH CONTRAST
TECHNIQUE: Multidetector CT imaging of the abdomen and pelvis was performed
using the standard protocol following bolus administration of
intravenous contrast.
CONTRAST:  75mL [Q1] IOPAMIDOL ([Q1]) INJECTION 61%

[Series 2: axial st · axial · 0.68mm/px · z∈[-492,-127]mm · 13 of 83 slices shown, 15 images]
[im 5/83  soft-tissue]
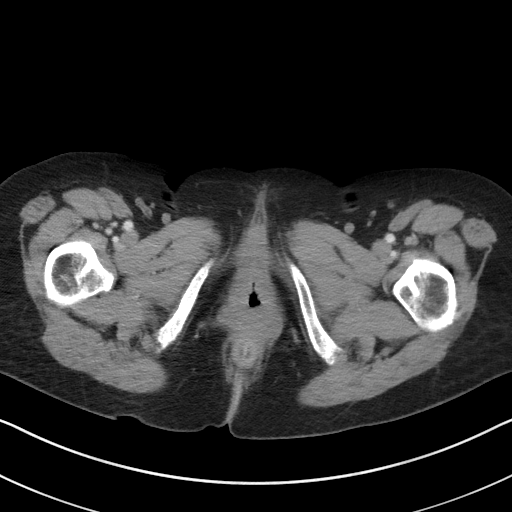
[im 5/83  bone]
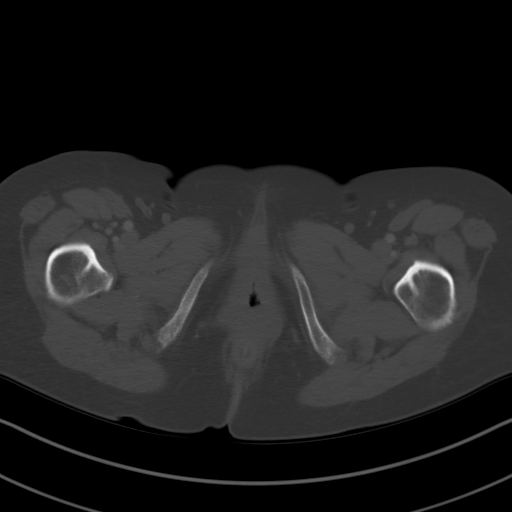
[im 13/83  soft-tissue]
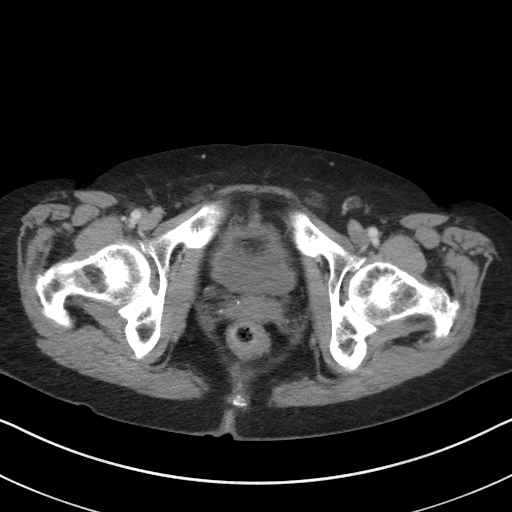
[im 18/83  soft-tissue]
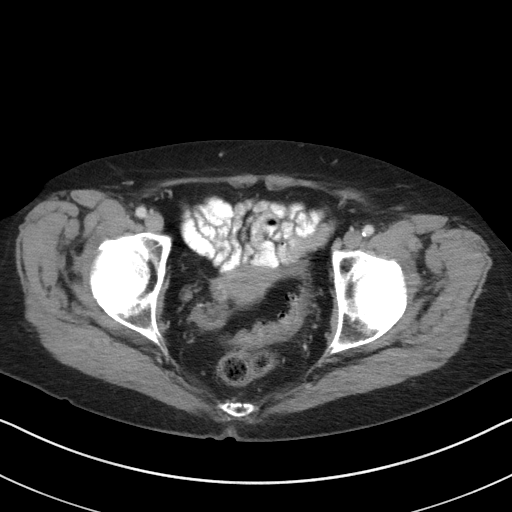
[im 22/83  soft-tissue]
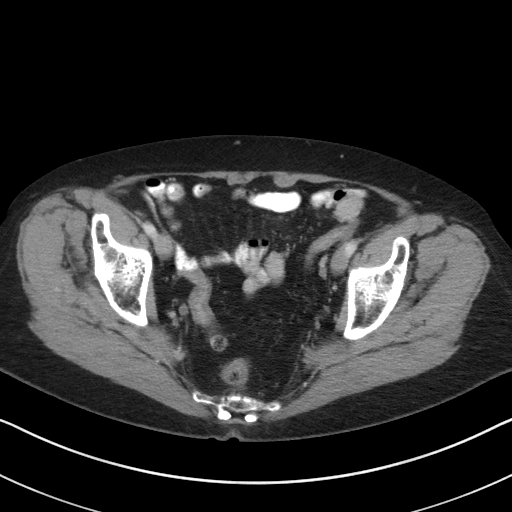
[im 31/83  soft-tissue]
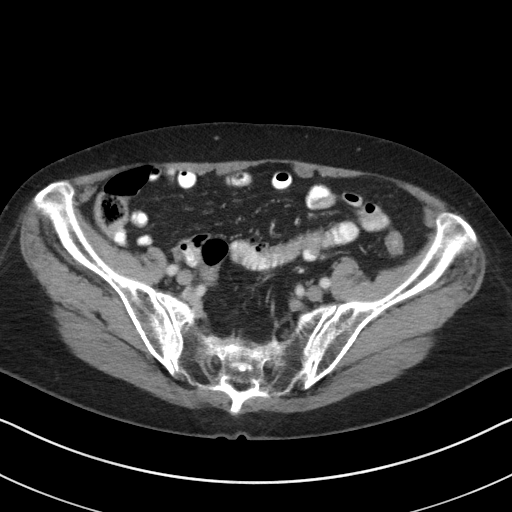
[im 35/83  soft-tissue]
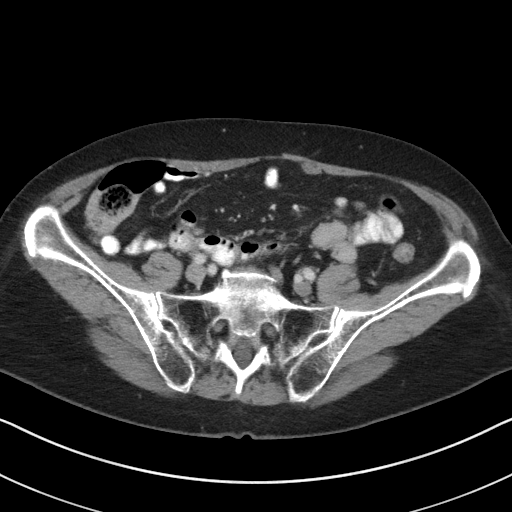
[im 44/83  soft-tissue]
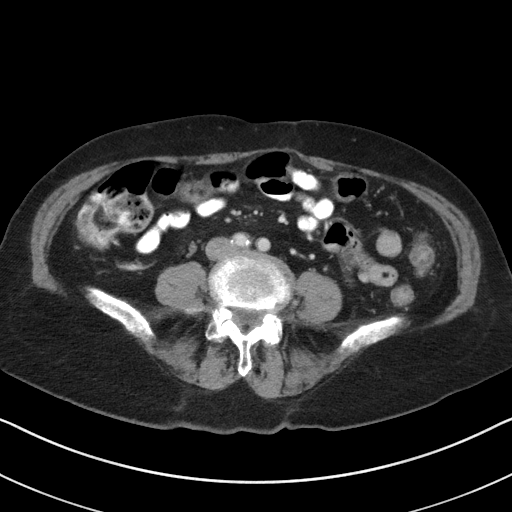
[im 48/83  soft-tissue]
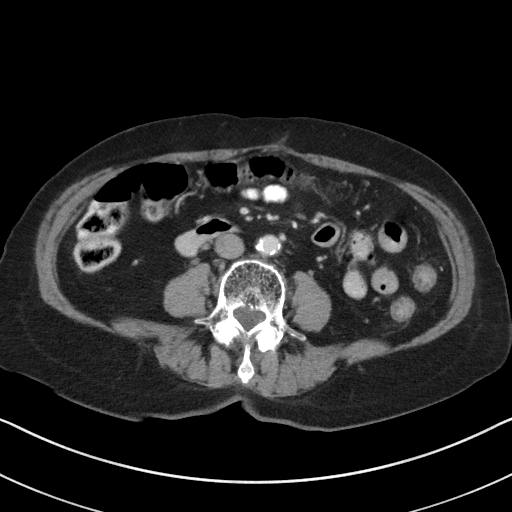
[im 52/83  soft-tissue]
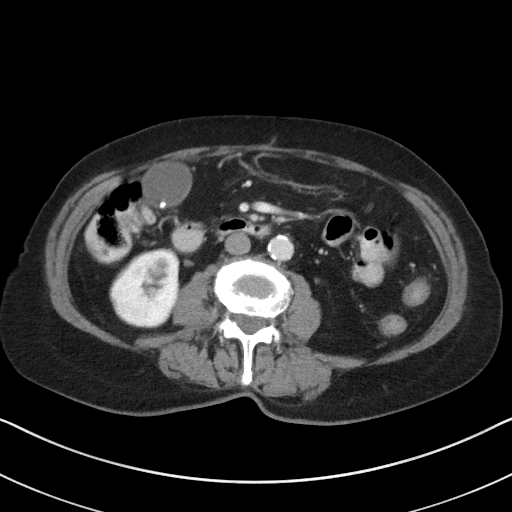
[im 52/83  bone]
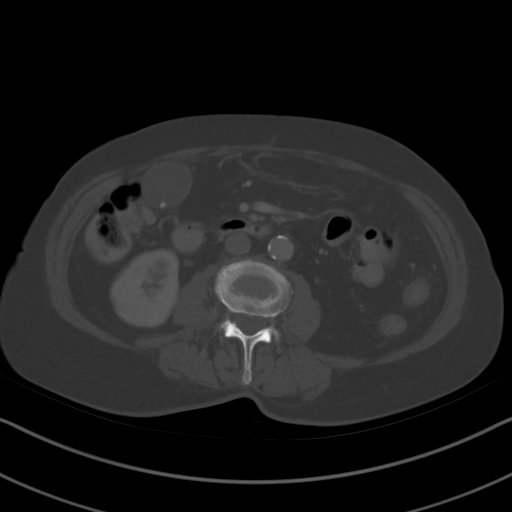
[im 61/83  soft-tissue]
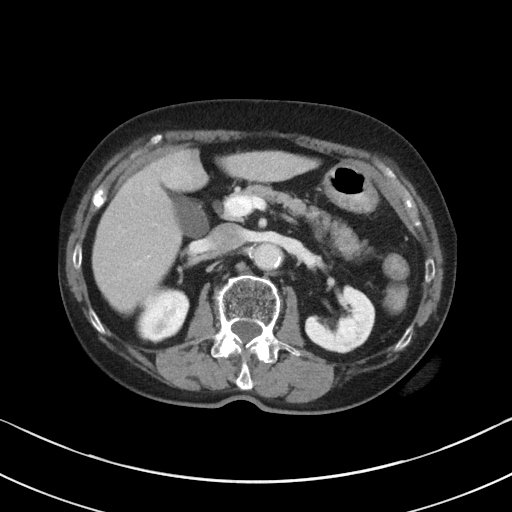
[im 65/83  soft-tissue]
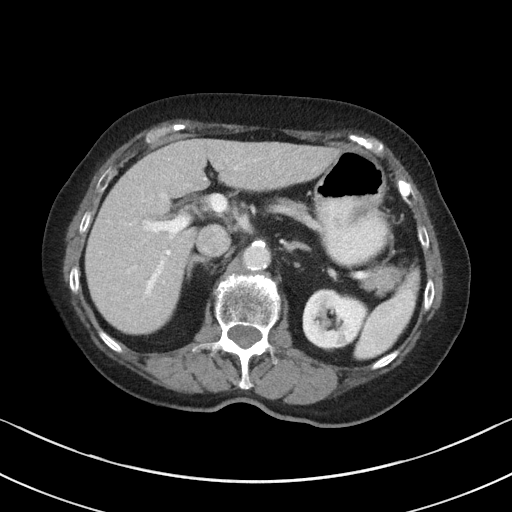
[im 70/83  soft-tissue]
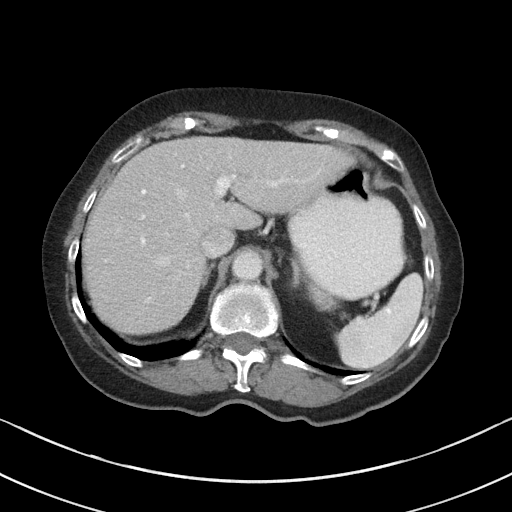
[im 78/83  soft-tissue]
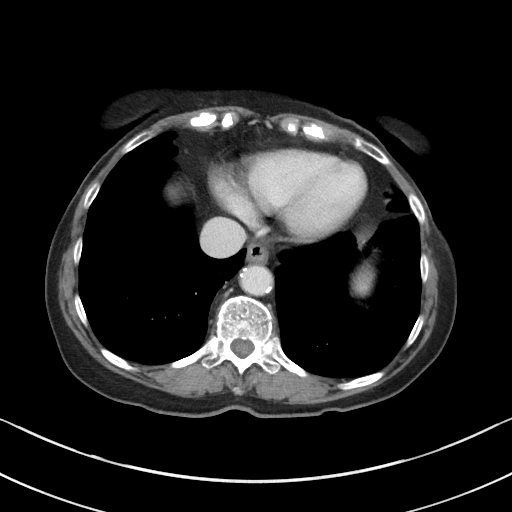

[Series 5: coronal st · coronal · 0.74mm/px · 3 of 72 slices shown]
[im 24/72  soft-tissue]
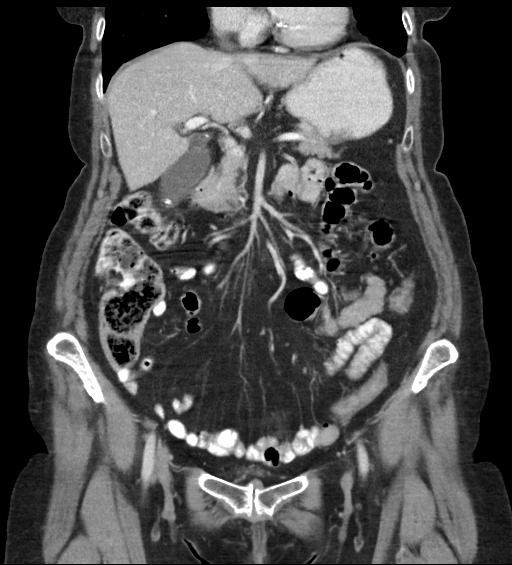
[im 32/72  soft-tissue]
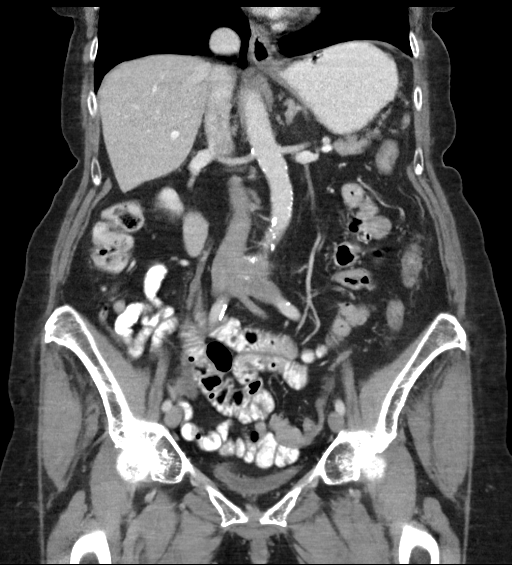
[im 40/72  soft-tissue]
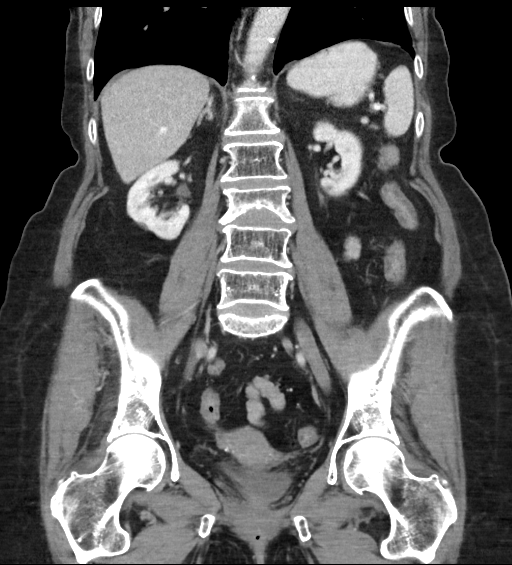

[16 of 46 positions shown; findings below may reference images not displayed]

FINDINGS: Lower chest: The lung bases are clear of acute process. No pleural
effusion or pulmonary lesions. The heart is normal in size. No
pericardial effusion. The distal esophagus and aorta are
unremarkable.

Hepatobiliary: No focal hepatic lesions or intrahepatic biliary
dilatation. Multiple calcified gallstones are noted in the
gallbladder. There may be mild slightly irregular gallbladder wall
thickening. No definite pericholecystic fluid or inflammation. There
is a 4 mm calculus at the junction of the gallbladder neck and
cystic duct. The common bile duct is normal in caliber and has a
normal course.

Pancreas: No mass, inflammation or ductal dilatation.

Spleen: Normal in size.  No focal lesions.

Adrenals/Urinary Tract: The adrenal glands and kidneys are
unremarkable. No renal or obstructing ureteral calculi or bladder
calculi. No renal or bladder mass.

Stomach/Bowel: The stomach, duodenum, small bowel and colon are
unremarkable. No inflammatory changes, mass lesions or obstructive
findings. The terminal ileum is normal. The appendix is normal.
Scattered sigmoid diverticulosis without findings for acute
diverticulitis.

Vascular/Lymphatic: No mesenteric or retroperitoneal mass or
adenopathy. Moderate atherosclerotic calcifications involving the
aorta and branch vessels but no focal aneurysm or dissection. The
major venous structures are patent.

Reproductive: The uterus and ovaries are unremarkable.

Other: No pelvic mass or adenopathy. No free pelvic fluid
collections. No inguinal mass or adenopathy.

Musculoskeletal: No significant bony findings. Prominent Schmorl's
node involving L3.
IMPRESSION: 1. 4 mm calculus at the origin of the cystic duct near the
gallbladder neck. Suspect mild irregular gallbladder wall thickening
but no pericholecystic inflammatory changes. Ultrasound may be
helpful for further evaluation. The common bile duct is normal in
caliber.
2. Cholelithiasis.
3. No abdominal mass or lymphadenopathy.
4. Moderate atherosclerotic calcifications involving the aorta and
iliac arteries.

## 2016-05-14 MED ORDER — IOPAMIDOL (ISOVUE-300) INJECTION 61%
75.0000 mL | Freq: Once | INTRAVENOUS | Status: AC | PRN
  Administered 2016-05-14: 75 mL via INTRAVENOUS

## 2016-05-15 ENCOUNTER — Other Ambulatory Visit: Payer: Self-pay | Admitting: Internal Medicine

## 2016-05-15 ENCOUNTER — Ambulatory Visit (INDEPENDENT_AMBULATORY_CARE_PROVIDER_SITE_OTHER): Payer: Medicare Other | Admitting: General Surgery

## 2016-05-15 ENCOUNTER — Ambulatory Visit
Admission: RE | Admit: 2016-05-15 | Discharge: 2016-05-15 | Disposition: A | Discharge status: Home / Self Care | Payer: Medicare Other | Source: Ambulatory Visit | Attending: Internal Medicine | Admitting: Internal Medicine

## 2016-05-15 ENCOUNTER — Encounter: Payer: Self-pay | Admitting: General Surgery

## 2016-05-15 VITALS — BP 130/70 | HR 68 | Temp 96.8°F | Resp 12 | Ht 62.0 in | Wt 135.0 lb

## 2016-05-15 DIAGNOSIS — K829 Disease of gallbladder, unspecified: Secondary | ICD-10-CM | POA: Diagnosis not present

## 2016-05-15 DIAGNOSIS — K8020 Calculus of gallbladder without cholecystitis without obstruction: Secondary | ICD-10-CM | POA: Insufficient documentation

## 2016-05-15 DIAGNOSIS — R1011 Right upper quadrant pain: Secondary | ICD-10-CM | POA: Insufficient documentation

## 2016-05-15 DIAGNOSIS — K8000 Calculus of gallbladder with acute cholecystitis without obstruction: Secondary | ICD-10-CM | POA: Diagnosis not present

## 2016-05-15 DIAGNOSIS — K810 Acute cholecystitis: Secondary | ICD-10-CM

## 2016-05-15 NOTE — Progress Notes (Signed)
Patient ID: Jeanette Newton, female   DOB: [birth year], 82 y.o.   MRN: [id number]  Chief Complaint  Patient presents with  . Abdominal Pain    HPI Jeanette Newton is a 82 y.o. female.  Here today for evaluation of abdomina pian. She states she started having right upper quadrant pain yesterday "all day", none prior. Sunday night she had steak, salad and baked potato with butter. She states it went through to her back. She did vomit "liquid" yesterday, none today.She denied vomiting undigested food. She states she feels much better today. She did have a little cereal and water today is all. Denies change in urine color. Daughter feels like she looked "ashy" yesterday: Coloration back to normal today. She had a CT yesterday and an ultrasound today. The patient lives at home with her husband, and is quite active.  She underwent exploratory celiotomy for diagnosis of lymphoma in 2006. No recent recurrence. She is here today with her daughter, Jeanette Newton.  HPI  Past Medical History:  Diagnosis Date  . Cancer [facility]) 2005-2006   lymphoma  . Status post chemoradiation    lymphoma  . Status post radiation therapy   . Thyroid disease     Past Surgical History:  Procedure Laterality Date  . ABDOMINAL EXPLORATION SURGERY  2005-2006   Dr Pat Patrick    Family History  Problem Relation Age of Onset  . Breast cancer Neg Hx     Social History Social History  Substance Use Topics  . Smoking status: Never Smoker  . Smokeless tobacco: Never Used  . Alcohol use No    Allergies  Allergen Reactions  . Clarithromycin Nausea Only    Biaxin  . Clindamycin Nausea Only  . Ciprofloxacin Swelling and Rash    Pt states she can take this medication  . Doxycycline Hyclate Rash  . Etodolac Rash  . Levaquin [Levofloxacin In D5w] Rash  . Penicillins Rash  . Propranolol Itching and Rash    Current Outpatient Prescriptions  Medication Sig Dispense Refill  . Calcium-Vitamin D 600-200 MG-UNIT tablet  Take 1 tablet by mouth 2 (two) times daily.     Marland Kitchen levothyroxine (SYNTHROID, LEVOTHROID) 75 MCG tablet Take 75 mcg by mouth daily before breakfast.     . Omega-3 Fatty Acids (FISH OIL PO) Take by mouth daily.      No current facility-administered medications for this visit.     Review of Systems Review of Systems  Constitutional: Negative.   Respiratory: Negative.   Cardiovascular: Negative.   Gastrointestinal: Positive for abdominal pain and vomiting. Negative for constipation and diarrhea.    Blood pressure 130/70, pulse 68, temperature (!) 96.8 F (36 C), temperature source Oral, resp. rate 12, height 5\' 2"  (1.575 m), weight 135 lb (61.2 kg).  Physical Exam Physical Exam  Constitutional: She is oriented to person, place, and time. She appears well-developed and well-nourished.  HENT:  Mouth/Throat: Oropharynx is clear and moist.  Eyes: Conjunctivae are normal. No scleral icterus.  Neck: Neck supple.  Cardiovascular: Normal rate, regular rhythm and normal heart sounds.   Pulmonary/Chest: Effort normal and breath sounds normal.  Abdominal: Soft. Normal appearance and bowel sounds are normal. There is no tenderness.  Lymphadenopathy:    She has no cervical adenopathy.  Neurological: She is alert and oriented to person, place, and time.  Skin: Skin is warm and dry.  Psychiatric: Her behavior is normal.    Data Reviewed CT of 05/14/2016 was reviewed. Gallbladder wall thickening without  pericholecystic fluid. Cholelithiasis. 4 mm calculus at the origin the cystic duct. No abdominal masses or lymphadenopathy. Abdominal ultrasound of 05/15/2016 showed gallbladder wall thickening of the 5 mm stone in changes thought to represent acute cholecystitis. No sonographic Murphy sign.  CBC of 05/14/2016 showed white blood cell count 13,400, hemoglobin 13.3. Platelet count 214,000.  [facility] metabolic panel was noted for a creatinine of 1.1 with a mildly depressed estimate it GFR 47.  Nonfasting blood sugar 138. Normal electrolytes. Normal liver function studies. Elevated d-dimer at 975 (less than 499) sees.  Normal troponin less than 0.03.  Assessment    Resolving acute cholecystitis/biliary colic.    Plan    The patient has been totally asymptomatic since last night. Her exam is unremarkable, she is afebrile and tolerating a diet.  Options for management were reviewed: 1)  elective cholecystectomy to minimize risk of future episodes versus 2) watchful waiting.  At this time we'll plan for elective cholecystectomy next week..    Laparoscopic Cholecystectomy with Intraoperative Cholangiogram. The procedure, including it's potential risks and complications (including but not limited to infection, bleeding, injury to intra-abdominal organs or bile ducts, bile leak, poor cosmetic result, sepsis and death) were discussed with the patient in detail. Non-operative options, including their inherent risks (acute calculous cholecystitis with possible choledocholithiasis or gallstone pancreatitis, with the risk of ascending cholangitis, sepsis, and death) were discussed as well. The patient expressed and understanding of what we discussed and wishes to proceed with laparoscopic cholecystectomy. The patient further understands that if it is technically not possible, or it is unsafe to proceed laparoscopically, that I will convert to an open cholecystectomy.  The patient is scheduled for surgery at [facility] on 05/25/16. She will pre admit at the hospital. The patient is aware of date and instructions.   This information has been scribed by Karie Fetch RN, BSN,BC.   Robert Bellow 05/17/2016, 5:57 AM

## 2016-05-15 NOTE — Patient Instructions (Addendum)
The patient is aware to call back for any questions or concerns.  Laparoscopic Cholecystectomy Laparoscopic cholecystectomy is surgery to remove the gallbladder. The gallbladder is located in the upper right part of the abdomen, behind the liver. It is a storage sac for bile, which is produced in the liver. Bile aids in the digestion and absorption of fats. Cholecystectomy is often done for inflammation of the gallbladder (cholecystitis). This condition is usually caused by a buildup of gallstones (cholelithiasis) in the gallbladder. Gallstones can block the flow of bile, and that can result in inflammation and pain. In severe cases, emergency surgery may be required. If emergency surgery is not required, you will have time to prepare for the procedure. Laparoscopic surgery is an alternative to open surgery. Laparoscopic surgery has a shorter recovery time. Your common bile duct may also need to be examined during the procedure. If stones are found in the common bile duct, they may be removed. LET [facility] CARE PROVIDER KNOW ABOUT:  Any allergies you have.  All medicines you are taking, including vitamins, herbs, eye drops, creams, and over-the-counter medicines.  Previous problems you or members of your family have had with the use of anesthetics.  Any blood disorders you have.  Previous surgeries you have had.  Any medical conditions you have. RISKS AND COMPLICATIONS Generally, this is a safe procedure. However, problems may occur, including:  Infection.  Bleeding.  Allergic reactions to medicines.  Damage to other structures or organs.  A stone remaining in the common bile duct.  A bile leak from the cyst duct that is clipped when your gallbladder is removed.  The need to convert to open surgery, which requires a larger incision in the abdomen. This may be necessary if your surgeon thinks that it is not safe to continue with a laparoscopic procedure. BEFORE THE  PROCEDURE  Ask your health care provider about:  Changing or stopping your regular medicines. This is especially important if you are taking diabetes medicines or blood thinners.  Taking medicines such as aspirin and ibuprofen. These medicines can thin your blood. Do not take these medicines before your procedure if your health care provider instructs you not to.  Follow instructions from your health care provider about eating or drinking restrictions.  Let your health care provider know if you develop a cold or an infection before surgery.  Plan to have someone take you home after the procedure.  Ask your health care provider how your surgical site will be marked or identified.  You may be given antibiotic medicine to help prevent infection. PROCEDURE  To reduce your risk of infection:  Your health care team will wash or sanitize their hands.  Your skin will be washed with soap.  An IV tube may be inserted into one of your veins.  You will be given a medicine to make you fall asleep (general anesthetic).  A breathing tube will be placed in your mouth.  The surgeon will make several small cuts (incisions) in your abdomen.  A thin, lighted tube (laparoscope) that has a tiny camera on the end will be inserted through one of the small incisions. The camera on the laparoscope will send a picture to a TV screen (monitor) in the operating room. This will give the surgeon a good view inside your abdomen.  A gas will be pumped into your abdomen. This will expand your abdomen to give the surgeon more room to perform the surgery.  Other tools that are needed  for the procedure will be inserted through the other incisions. The gallbladder will be removed through one of the incisions.  After your gallbladder has been removed, the incisions will be closed with stitches (sutures), staples, or skin glue.  Your incisions may be covered with a bandage (dressing). The procedure may vary among  health care providers and hospitals. AFTER THE PROCEDURE  Your blood pressure, heart rate, breathing rate, and blood oxygen level will be monitored often until the medicines you were given have worn off.  You will be given medicines as needed to control your pain.   This information is not intended to replace advice given to you by your health care provider. Make sure you discuss any questions you have with your health care provider.   Document Released: 08/13/2005 Document Revised: 05/04/2015 Document Reviewed: 03/25/2013 Elsevier Interactive Patient Education [employer].  The patient is scheduled for surgery at [facility] on 05/25/16. She will pre admit at the hospital. The patient is aware of date and instructions.

## 2016-05-21 ENCOUNTER — Encounter
Admission: RE | Admit: 2016-05-21 | Discharge: 2016-05-21 | Disposition: A | Discharge status: Home / Self Care | Payer: Medicare Other | Source: Ambulatory Visit | Attending: General Surgery | Admitting: General Surgery

## 2016-05-21 DIAGNOSIS — Z01818 Encounter for other preprocedural examination: Secondary | ICD-10-CM | POA: Insufficient documentation

## 2016-05-21 DIAGNOSIS — Z8572 Personal history of non-Hodgkin lymphomas: Secondary | ICD-10-CM | POA: Insufficient documentation

## 2016-05-21 DIAGNOSIS — Z9221 Personal history of antineoplastic chemotherapy: Secondary | ICD-10-CM | POA: Insufficient documentation

## 2016-05-21 DIAGNOSIS — Z9889 Other specified postprocedural states: Secondary | ICD-10-CM | POA: Insufficient documentation

## 2016-05-21 DIAGNOSIS — Z79899 Other long term (current) drug therapy: Secondary | ICD-10-CM | POA: Insufficient documentation

## 2016-05-21 DIAGNOSIS — Z888 Allergy status to other drugs, medicaments and biological substances status: Secondary | ICD-10-CM | POA: Insufficient documentation

## 2016-05-21 DIAGNOSIS — K8042 Calculus of bile duct with acute cholecystitis without obstruction: Secondary | ICD-10-CM | POA: Insufficient documentation

## 2016-05-21 HISTORY — DX: Hypothyroidism, unspecified: E03.9

## 2016-05-21 NOTE — Patient Instructions (Signed)
  Your procedure is scheduled [id number] Sept. 29 , 2017. Report to Same Day Surgery. To find out your arrival time please call [phone] between 1PM - 3PM on Thursday Sept. 28, 2017.  Remember: Instructions that are not followed completely may result in serious medical risk, up to and including death, or upon the discretion of your surgeon and anesthesiologist your surgery may need to be rescheduled.    _x___ 1. Do not eat food or drink liquids after midnight. No gum chewing or hard candies.     ____ 2. No Alcohol for 24 hours before or after surgery.   ____ 3. Bring all medications with you on the day of surgery if instructed.    __x__ 4. Notify your doctor if there is any change in your medical condition     (cold, fever, infections).    _____ 5. No smoking 24 hours prior to surgery.     Do not wear jewelry, make-up, hairpins, clips or nail polish.  Do not wear lotions, powders, or perfumes.   Do not shave 48 hours prior to surgery. Men may shave face and neck.  Do not bring valuables to the hospital.    [facility] is not responsible for any belongings or valuables.               Contacts, dentures or bridgework may not be worn into surgery.  Leave your suitcase in the car. After surgery it may be brought to your room.  For patients admitted to the hospital, discharge time is determined by your treatment team.   Patients discharged the day of surgery will not be allowed to drive home.    Please read over the following fact sheets that you were given:   [facility] Preparing for Surgery  __x__ Take these medicines the morning of surgery with A SIP OF WATER:    1. levothyroxine (SYNTHROID, LEVOTHROID)  ____ Fleet Enema (as directed)   _x___ Use CHG Soap as directed on instruction sheet  ____ Use inhalers on the day of surgery and bring to hospital day of surgery  ____ Stop metformin 2 days prior to surgery    ____ Take 1/2 of usual insulin dose the night before  surgery and none on the morning of surgery.   ____ Stop Coumadin/Plavix/aspirin on does not apply.  __x__ Stop Anti-inflammatories such as Advil, Aleve, Ibuprofen, Motrin, Naproxen, Naprosyn, Goodies powders or aspirin products. OK to take Tylenol.   _x___ Stop supplements Omega-3 Fatty Acids (FISH OIL PO)  until after surgery.    ____ Bring C-Pap to the hospital.

## 2016-05-21 NOTE — Pre-Procedure Instructions (Signed)
Component Name Value Ref Range  Vent Rate (bpm) 54   PR Interval (msec) 220   QRS Interval (msec) 78   QT Interval (msec) 474   QTc (msec) 449   Result Narrative  Sinus bradycardia 1st degree AV block Otherwise normal ECG No previous ECGs available I reviewed and concur with this report. Electronically signed [id number] MD, Seama ([id number]) on 05/16/2016 7:35:21 AM

## 2016-05-22 ENCOUNTER — Other Ambulatory Visit: Payer: Self-pay | Admitting: General Surgery

## 2016-05-22 DIAGNOSIS — K8020 Calculus of gallbladder without cholecystitis without obstruction: Secondary | ICD-10-CM

## 2016-05-25 ENCOUNTER — Ambulatory Visit
Admission: RE | Admit: 2016-05-25 | Discharge: 2016-05-25 | Disposition: A | Discharge status: Home / Self Care | Payer: Medicare Other | Source: Ambulatory Visit | Attending: General Surgery | Admitting: General Surgery

## 2016-05-25 ENCOUNTER — Encounter: Payer: Self-pay | Admitting: Anesthesiology

## 2016-05-25 ENCOUNTER — Ambulatory Visit: Payer: Medicare Other | Admitting: Anesthesiology

## 2016-05-25 ENCOUNTER — Encounter
Admission: RE | Disposition: A | Discharge status: Home / Self Care | Payer: Self-pay | Source: Ambulatory Visit | Attending: General Surgery

## 2016-05-25 ENCOUNTER — Ambulatory Visit: Payer: Medicare Other

## 2016-05-25 DIAGNOSIS — Z888 Allergy status to other drugs, medicaments and biological substances status: Secondary | ICD-10-CM | POA: Insufficient documentation

## 2016-05-25 DIAGNOSIS — E039 Hypothyroidism, unspecified: Secondary | ICD-10-CM | POA: Diagnosis not present

## 2016-05-25 DIAGNOSIS — Z9221 Personal history of antineoplastic chemotherapy: Secondary | ICD-10-CM | POA: Insufficient documentation

## 2016-05-25 DIAGNOSIS — Z880 Allergy status to penicillin: Secondary | ICD-10-CM | POA: Diagnosis not present

## 2016-05-25 DIAGNOSIS — Z881 Allergy status to other antibiotic agents status: Secondary | ICD-10-CM | POA: Insufficient documentation

## 2016-05-25 DIAGNOSIS — Z79899 Other long term (current) drug therapy: Secondary | ICD-10-CM | POA: Diagnosis not present

## 2016-05-25 DIAGNOSIS — K8000 Calculus of gallbladder with acute cholecystitis without obstruction: Secondary | ICD-10-CM

## 2016-05-25 DIAGNOSIS — K8012 Calculus of gallbladder with acute and chronic cholecystitis without obstruction: Secondary | ICD-10-CM | POA: Diagnosis not present

## 2016-05-25 DIAGNOSIS — K660 Peritoneal adhesions (postprocedural) (postinfection): Secondary | ICD-10-CM | POA: Diagnosis not present

## 2016-05-25 DIAGNOSIS — Z923 Personal history of irradiation: Secondary | ICD-10-CM | POA: Insufficient documentation

## 2016-05-25 DIAGNOSIS — Z8572 Personal history of non-Hodgkin lymphomas: Secondary | ICD-10-CM | POA: Diagnosis not present

## 2016-05-25 DIAGNOSIS — K8044 Calculus of bile duct with chronic cholecystitis without obstruction: Secondary | ICD-10-CM

## 2016-05-25 DIAGNOSIS — K8020 Calculus of gallbladder without cholecystitis without obstruction: Secondary | ICD-10-CM

## 2016-05-25 DIAGNOSIS — K828 Other specified diseases of gallbladder: Secondary | ICD-10-CM | POA: Diagnosis not present

## 2016-05-25 HISTORY — PX: CHOLECYSTECTOMY: SHX55

## 2016-05-25 IMAGING — CR DG C-ARM 61-120 MIN
1 series · 1 of 1 positions shown · non-contrast
Comparison: None.

CLINICAL DATA: Intraoperative cholangiogram

EXAM:
DG C-ARM 61-120 MIN

[1]
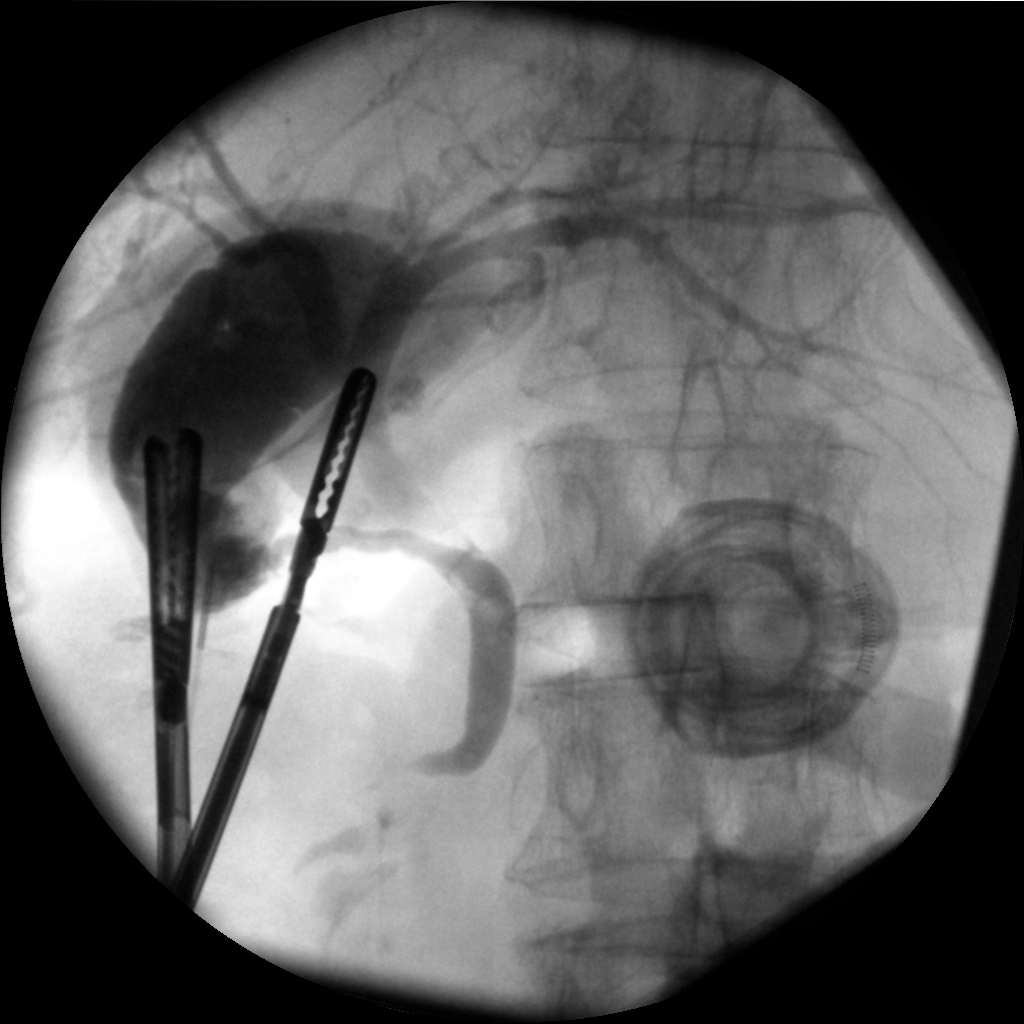

[1 of 1 positions shown; findings below may reference images not displayed]

FINDINGS: Contrast opacifies the gallbladder and intrahepatic/ extrahepatic
ducts.

No intrahepatic ductal dilatation. Mid/distal CBD is mildly dilated.

Suspected filling defect in the mid common duct, likely CBD stone
versus air bubble, possibly clot.

Additional smaller filling defects in the distal CBD may reflect air
bubbles.
IMPRESSION: Mid/distal CBD is mildly dilated.

Filling defect in the mid CBD, as above.

## 2016-05-25 SURGERY — LAPAROSCOPIC CHOLECYSTECTOMY WITH INTRAOPERATIVE CHOLANGIOGRAM
Anesthesia: General | Wound class: Clean Contaminated

## 2016-05-25 MED ORDER — HYDROCODONE-ACETAMINOPHEN 5-325 MG PO TABS: 1.0000 | ORAL_TABLET | ORAL | 0 refills | Status: DC | PRN

## 2016-05-25 MED ORDER — PROPOFOL 10 MG/ML IV BOLUS
INTRAVENOUS | Status: DC | PRN
  Administered 2016-05-25: 100 mg via INTRAVENOUS

## 2016-05-25 MED ORDER — LACTATED RINGERS IV SOLN
INTRAVENOUS | Status: DC
  Administered 2016-05-25: 11:00:00 via INTRAVENOUS

## 2016-05-25 MED ORDER — ROCURONIUM BROMIDE 100 MG/10ML IV SOLN
INTRAVENOUS | Status: DC | PRN
  Administered 2016-05-25: 30 mg via INTRAVENOUS

## 2016-05-25 MED ORDER — ACETAMINOPHEN 10 MG/ML IV SOLN
INTRAVENOUS | Status: DC | PRN
  Administered 2016-05-25: 1000 mg via INTRAVENOUS

## 2016-05-25 MED ORDER — SODIUM CHLORIDE 0.9 % IV SOLN
INTRAVENOUS | Status: DC | PRN
  Administered 2016-05-25: 36 mL

## 2016-05-25 MED ORDER — CEFAZOLIN SODIUM-DEXTROSE 2-4 GM/100ML-% IV SOLN
INTRAVENOUS | Status: AC
  Filled 2016-05-25: qty 100

## 2016-05-25 MED ORDER — KETAMINE HCL 50 MG/ML IJ SOLN
INTRAMUSCULAR | Status: DC | PRN
  Administered 2016-05-25: 25 mg via INTRAVENOUS

## 2016-05-25 MED ORDER — DEXAMETHASONE SODIUM PHOSPHATE 10 MG/ML IJ SOLN
INTRAMUSCULAR | Status: DC | PRN
  Administered 2016-05-25: 5 mg via INTRAVENOUS

## 2016-05-25 MED ORDER — BUPIVACAINE-EPINEPHRINE (PF) 0.5% -1:200000 IJ SOLN
INTRAMUSCULAR | Status: DC | PRN
  Administered 2016-05-25: 10 mL

## 2016-05-25 MED ORDER — FENTANYL CITRATE (PF) 100 MCG/2ML IJ SOLN: 25.0000 ug | INTRAMUSCULAR | Status: DC | PRN

## 2016-05-25 MED ORDER — CEFAZOLIN SODIUM-DEXTROSE 2-4 GM/100ML-% IV SOLN: 2.0000 g | INTRAVENOUS | Status: DC

## 2016-05-25 MED ORDER — FAMOTIDINE 20 MG PO TABS
20.0000 mg | ORAL_TABLET | Freq: Once | ORAL | Status: AC
  Administered 2016-05-25: 20 mg via ORAL

## 2016-05-25 MED ORDER — FENTANYL CITRATE (PF) 100 MCG/2ML IJ SOLN
INTRAMUSCULAR | Status: DC | PRN
  Administered 2016-05-25: 50 ug via INTRAVENOUS
  Administered 2016-05-25 (×2): 25 ug via INTRAVENOUS

## 2016-05-25 MED ORDER — ACETAMINOPHEN 10 MG/ML IV SOLN
INTRAVENOUS | Status: AC
  Filled 2016-05-25: qty 100

## 2016-05-25 MED ORDER — SUGAMMADEX SODIUM 200 MG/2ML IV SOLN
INTRAVENOUS | Status: DC | PRN
  Administered 2016-05-25: 120 mg via INTRAVENOUS

## 2016-05-25 MED ORDER — FAMOTIDINE 20 MG PO TABS
ORAL_TABLET | ORAL | Status: AC
  Administered 2016-05-25: 20 mg via ORAL
  Filled 2016-05-25: qty 1

## 2016-05-25 MED ORDER — BUPIVACAINE-EPINEPHRINE (PF) 0.5% -1:200000 IJ SOLN
INTRAMUSCULAR | Status: AC
  Filled 2016-05-25: qty 30

## 2016-05-25 MED ORDER — ONDANSETRON HCL 4 MG/2ML IJ SOLN: 4.0000 mg | Freq: Once | INTRAMUSCULAR | Status: DC | PRN

## 2016-05-25 MED ORDER — CEFAZOLIN SODIUM-DEXTROSE 2-4 GM/100ML-% IV SOLN
2.0000 g | INTRAVENOUS | Status: AC
  Administered 2016-05-25: 2 g via INTRAVENOUS

## 2016-05-25 MED ORDER — HYDROCODONE-ACETAMINOPHEN 5-325 MG PO TABS
ORAL_TABLET | ORAL | Status: AC
  Filled 2016-05-25: qty 1

## 2016-05-25 MED ORDER — SODIUM CHLORIDE 0.9 % IJ SOLN
INTRAMUSCULAR | Status: AC
  Filled 2016-05-25: qty 50

## 2016-05-25 MED ORDER — HYDROCODONE-ACETAMINOPHEN 5-325 MG PO TABS
1.0000 | ORAL_TABLET | ORAL | Status: DC | PRN
  Administered 2016-05-25: 1 via ORAL

## 2016-05-25 SURGICAL SUPPLY — 47 items
APPLIER CLIP ROT 10 11.4 M/L (STAPLE) ×3
BENZOIN TINCTURE PRP APPL 2/3 (GAUZE/BANDAGES/DRESSINGS) ×3 IMPLANT
BLADE SURG 11 STRL SS SAFETY (MISCELLANEOUS) ×3 IMPLANT
CANISTER SUCT 1200ML W/VALVE (MISCELLANEOUS) ×3 IMPLANT
CANNULA DILATOR  5MM W/SLV (CANNULA) ×2
CANNULA DILATOR 10 W/SLV (CANNULA) ×2 IMPLANT
CANNULA DILATOR 10MM W/SLV (CANNULA) ×1
CANNULA DILATOR 5 W/SLV (CANNULA) ×4 IMPLANT
CATH CHOLANG 76X19 KUMAR (CATHETERS) ×3 IMPLANT
CHLORAPREP W/TINT 26ML (MISCELLANEOUS) ×3 IMPLANT
CLIP APPLIE ROT 10 11.4 M/L (STAPLE) ×1 IMPLANT
CLOSURE WOUND 1/2 X4 (GAUZE/BANDAGES/DRESSINGS) ×1
CONRAY 60ML FOR OR (MISCELLANEOUS) ×3 IMPLANT
DISSECTOR KITTNER STICK (MISCELLANEOUS) IMPLANT
DISSECTORS/KITTNER STICK (MISCELLANEOUS)
DRAPE SHEET LG 3/4 BI-LAMINATE (DRAPES) ×3 IMPLANT
DRESSING TELFA 4X3 1S ST N-ADH (GAUZE/BANDAGES/DRESSINGS) IMPLANT
DRSG TEGADERM 2-3/8X2-3/4 SM (GAUZE/BANDAGES/DRESSINGS) ×3 IMPLANT
DRSG TELFA 3X8 NADH (GAUZE/BANDAGES/DRESSINGS) ×3 IMPLANT
ELECT REM PT RETURN 9FT ADLT (ELECTROSURGICAL) ×3
ELECTRODE REM PT RTRN 9FT ADLT (ELECTROSURGICAL) ×1 IMPLANT
ENDOPOUCH RETRIEVER 10 (MISCELLANEOUS) ×3 IMPLANT
GLOVE BIO SURGEON STRL SZ7.5 (GLOVE) ×9 IMPLANT
GLOVE INDICATOR 8.0 STRL GRN (GLOVE) ×9 IMPLANT
GOWN STRL REUS W/ TWL LRG LVL3 (GOWN DISPOSABLE) ×3 IMPLANT
GOWN STRL REUS W/TWL LRG LVL3 (GOWN DISPOSABLE) ×6
IRRIGATION STRYKERFLOW (MISCELLANEOUS) ×1 IMPLANT
IRRIGATOR STRYKERFLOW (MISCELLANEOUS) ×3
IV LACTATED RINGERS 1000ML (IV SOLUTION) ×3 IMPLANT
KIT RM TURNOVER STRD PROC AR (KITS) ×3 IMPLANT
LABEL OR SOLS (LABEL) ×3 IMPLANT
NDL INSUFF ACCESS 14 VERSASTEP (NEEDLE) ×3 IMPLANT
NEEDLE HYPO 22GX1.5 SAFETY (NEEDLE) ×3 IMPLANT
NS IRRIG 500ML POUR BTL (IV SOLUTION) ×3 IMPLANT
PACK LAP CHOLECYSTECTOMY (MISCELLANEOUS) ×3 IMPLANT
SCISSORS METZENBAUM CVD 33 (INSTRUMENTS) ×3 IMPLANT
SEAL FOR SCOPE WARMER C3101 (MISCELLANEOUS) ×3 IMPLANT
STRIP CLOSURE SKIN 1/2X4 (GAUZE/BANDAGES/DRESSINGS) ×2 IMPLANT
SUT PROLENE 0 CT 2 (SUTURE) ×3 IMPLANT
SUT VIC AB 0 CT2 27 (SUTURE) ×3 IMPLANT
SUT VIC AB 4-0 FS2 27 (SUTURE) ×3 IMPLANT
SWABSTK COMLB BENZOIN TINCTURE (MISCELLANEOUS) ×3 IMPLANT
SYR CONTROL 10ML (SYRINGE) ×3 IMPLANT
TROCAR XCEL BLUNT TIP 100MML (ENDOMECHANICALS) ×3 IMPLANT
TROCAR XCEL NON-BLD 11X100MML (ENDOMECHANICALS) ×3 IMPLANT
TUBING INSUFFLATOR HI FLOW (MISCELLANEOUS) ×3 IMPLANT
WATER STERILE IRR 1000ML POUR (IV SOLUTION) ×3 IMPLANT

## 2016-05-25 NOTE — Anesthesia Procedure Notes (Signed)
Procedure Name: Intubation Performed by: Emmalena Canny Pre-anesthesia Checklist: Patient identified, Patient being monitored, Timeout performed, Emergency Drugs available and Suction available Patient Re-evaluated:Patient Re-evaluated prior to inductionOxygen Delivery Method: Circle system utilized Preoxygenation: Pre-oxygenation with 100% oxygen Intubation Type: IV induction Ventilation: Mask ventilation without difficulty Laryngoscope Size: Mac and 3 Grade View: Grade I Tube type: Oral Tube size: 7.0 mm Number of attempts: 1 Airway Equipment and Method: Stylet Placement Confirmation: ETT inserted through vocal cords under direct vision,  positive ETCO2 and breath sounds checked- equal and bilateral Secured at: 21 cm Tube secured with: Tape Dental Injury: Teeth and Oropharynx as per pre-operative assessment        

## 2016-05-25 NOTE — H&P (Signed)
No change in clinical history or exam. For cholecystectomy.  

## 2016-05-25 NOTE — Discharge Instructions (Signed)

## 2016-05-25 NOTE — Anesthesia Preprocedure Evaluation (Signed)
Anesthesia Evaluation  Patient identified by MRN, date of birth, ID band Patient awake    Reviewed: Allergy & Precautions, NPO status , Patient's Chart, lab work & pertinent test results, reviewed documented beta blocker date and time   Airway Mallampati: II  TM Distance: >3 FB     Dental  (+) Chipped   Pulmonary           Cardiovascular      Neuro/Psych    GI/Hepatic   Endo/Other  Hypothyroidism   Renal/GU      Musculoskeletal   Abdominal   Peds  Hematology   Anesthesia Other Findings Hx of lymphoma with chemo.  Reproductive/Obstetrics                             Anesthesia Physical Anesthesia Plan  ASA: III  Anesthesia Plan: General   Post-op Pain Management:    Induction: Intravenous  Airway Management Planned: Oral ETT  Additional Equipment:   Intra-op Plan:   Post-operative Plan:   Informed Consent: I have reviewed the patients History and Physical, chart, labs and discussed the procedure including the risks, benefits and alternatives for the proposed anesthesia with the patient or authorized representative who has indicated his/her understanding and acceptance.     Plan Discussed with: CRNA  Anesthesia Plan Comments:         Anesthesia Quick Evaluation

## 2016-05-25 NOTE — Op Note (Signed)
Preoperative diagnosis: Acute and chronic cholecystitis and cholelithiasis.  Postoperative diagnosis: Same, with intra-abdominal adhesions.  Operative procedure: Laparoscopy, lysis of adhesions, cholecystectomy with intraoperative cholangiograms.  Operating surgeon: Ollen Bowl.  Anesthesia: Gen. endotracheal.  Estimated blood loss: 5 mL.  Clinical note: This 82 year old woman recently had an episode of severe abdominal pain and ultrasound findings of acute cholecystitis. She was asymptomatic at the time of my evaluation was elected to complete the cholecystectomy as an elective procedure. The patient has previously undergone exploratory celiotomy through a midline incision followed by radiation for lymphoma approximate 15 years ago.  The patient had SCD stockings for DVT prevention. She received Kefzol intravenously.      Operative note: With the patient under adequate general endotracheal anesthesia the abdomen was prepped with ChloraPrep and draped. A 3 cm infraumbilical incision was made and carried of the skin and subcutaneous tissue. The fascia was incised and the peritoneum entered. A 0 Prolene baseball stitch was placed. A Hassan cannula was placed and pneumoperitoneum established with CO2 a 10 mmHg pressure. Inspection showed moderate adhesions in the right upper quadrant and more so in the midline. These were taken down with scissor dissection. Hemostasis was with cautery.   The 11 mm XL port was placed to the epigastrium under direct vision. The gallbladder wall was noted to be markedly thickened. The neck of the gallbladder was cleared and the cystic duct and cystic artery identified. Fluoroscopic cholangiograms were completed using 36 mL of one half strength Conray 60. This showed prompt filling of the right and left hepatic ducts and common bile duct. No evidence of dilatation. Slow flow through the ampulla. No evidence of retained stone on multiple images. The cystic duct and  cystic artery were doubly clipped and divided. The gallbladder was then removed from the liver bed making use of hook cautery dissection. It was placed into an Endo Catch bag. He was delivered to the [place] port site. The abdomen was reinsufflated. Inspection from the epigastric site showed no evidence of injury. The right upper quadrant was irrigated with lactated Ringer's solution. The abdomen was then desufflated under direct vision. The pursestring suture was snugged down to close the fascia below the umbilicus. Skin incisions were closed with 4-0 Vicryl subcutaneous sutures. Benzoin, Steri-Strips, Telfa and Tegaderm dressings were applied.  The patient tolerated the procedure well and was taken the recovery room in stable condition.

## 2016-05-25 NOTE — Transfer of Care (Signed)
Immediate Anesthesia Transfer of Care Note  Patient: Jeanette Newton  Procedure(s) Performed: Procedure(s): LAPAROSCOPIC CHOLECYSTECTOMY WITH INTRAOPERATIVE CHOLANGIOGRAM (N/A)  Patient Location: PACU  Anesthesia Type:General  Level of Consciousness: awake  Airway & Oxygen Therapy: Patient Spontanous Breathing and Patient connected to face mask oxygen  Post-op Assessment: Report given to RN and Post -op Vital signs reviewed and stable  Post vital signs: Reviewed  Last Vitals:  Vitals:   05/25/16 1022 05/25/16 1312  BP: (!) 116/91 (!) 153/63  Pulse: 66 61  Resp: 16 15  Temp: (!) 36 C 36.1 C    Last Pain:  Vitals:   05/25/16 1022  TempSrc: Tympanic         Complications: No apparent anesthesia complications

## 2016-05-25 NOTE — Anesthesia Postprocedure Evaluation (Signed)
Anesthesia Post Note  Patient: Conchetta S Koudelka  Procedure(s) Performed: Procedure(s) (LRB): LAPAROSCOPIC CHOLECYSTECTOMY WITH INTRAOPERATIVE CHOLANGIOGRAM (N/A)  Patient location during evaluation: PACU Anesthesia Type: General Level of consciousness: awake and alert Pain management: pain level controlled Vital Signs Assessment: post-procedure vital signs reviewed and stable Respiratory status: spontaneous breathing, nonlabored ventilation, respiratory function stable and patient connected to nasal cannula oxygen Cardiovascular status: blood pressure returned to baseline and stable Postop Assessment: no signs of nausea or vomiting Anesthetic complications: no    Last Vitals:  Vitals:   05/25/16 1357 05/25/16 1415  BP: (!) 131/55 (!) 149/57  Pulse: (!) 51 (!) 52  Resp: 14 16  Temp: 36.6 C 36.6 C    Last Pain:  Vitals:   05/25/16 1415  TempSrc: Oral  PainSc: [place]

## 2016-05-28 LAB — SURGICAL PATHOLOGY

## 2016-06-05 ENCOUNTER — Encounter: Payer: Self-pay | Admitting: General Surgery

## 2016-06-05 ENCOUNTER — Ambulatory Visit (INDEPENDENT_AMBULATORY_CARE_PROVIDER_SITE_OTHER): Payer: Medicare Other | Admitting: General Surgery

## 2016-06-05 VITALS — BP 116/60 | HR 78 | Resp 18 | Ht 62.0 in | Wt 132.0 lb

## 2016-06-05 DIAGNOSIS — K8000 Calculus of gallbladder with acute cholecystitis without obstruction: Secondary | ICD-10-CM

## 2016-06-05 NOTE — Patient Instructions (Signed)
The patient is aware to call back for any questions or concerns.  

## 2016-06-05 NOTE — Progress Notes (Signed)
Patient ID: Jeanette Newton, female   DOB: 11/25/1927, 82 y.o.   MRN: [id number]  Chief Complaint  Patient presents with  . Routine Post Op    HPI Jeanette Newton is a 82 y.o. female.  Here today for postoperative visit, laparoscopic cholecystectomy on 05-25-16. States she is getting along well. Denies any gastrointestinal issues, bowels moving regular.  She is here today with her daughter, Jeanette Newton.  HPI  Past Medical History:  Diagnosis Date  . Cancer [facility]) 2005-2006   lymphoma  . Hypothyroidism   . Status post chemoradiation    lymphoma  . Status post radiation therapy   . Thyroid disease     Past Surgical History:  Procedure Laterality Date  . ABDOMINAL EXPLORATION SURGERY  2005-2006   Dr Pat Patrick  . CHOLECYSTECTOMY N/A 05/25/2016   Procedure: LAPAROSCOPIC CHOLECYSTECTOMY WITH INTRAOPERATIVE CHOLANGIOGRAM;  Surgeon: Robert Bellow, MD;  Location: [facility] ORS;  Service: General;  Laterality: N/A;    Family History  Problem Relation Age of Onset  . Breast cancer Neg Hx     Social History Social History  Substance Use Topics  . Smoking status: Never Smoker  . Smokeless tobacco: Never Used  . Alcohol use No    Allergies  Allergen Reactions  . Clarithromycin Nausea Only    Biaxin  . Clindamycin Nausea Only  . Ciprofloxacin Swelling and Rash    Pt states she can take this medication  . Doxycycline Hyclate Rash  . Etodolac Rash  . Levaquin [Levofloxacin In D5w] Rash  . Penicillins Rash  . Propranolol Itching and Rash    Current Outpatient Prescriptions  Medication Sig Dispense Refill  . Calcium-Vitamin D 600-200 MG-UNIT tablet Take 1 tablet by mouth 2 (two) times daily.     Marland Kitchen levothyroxine (SYNTHROID, LEVOTHROID) 75 MCG tablet Take 75 mcg by mouth daily before breakfast.     . Omega-3 Fatty Acids (FISH OIL PO) Take by mouth daily.      No current facility-administered medications for this visit.     Review of Systems Review of Systems  Constitutional:  Negative.   Respiratory: Negative.   Cardiovascular: Negative.     Blood pressure 116/60, pulse 78, resp. rate 18, height 5\' 2"  (1.575 m), weight 132 lb (59.9 kg).  Physical Exam Physical Exam  Constitutional: She is oriented to person, place, and time. She appears well-developed and well-nourished.  HENT:  Mouth/Throat: Oropharynx is clear and moist.  Eyes: Conjunctivae are normal. No scleral icterus.  Neck: Neck supple.  Cardiovascular: Normal rate, regular rhythm and normal heart sounds.   Pulses:      Dorsalis pedis pulses are 2+ on the right side, and 2+ on the left side.  No lower leg edema.  Pulmonary/Chest: Effort normal and breath sounds normal.  Abdominal: Soft. Bowel sounds are normal. There is no tenderness.  Lymphadenopathy:    She has no cervical adenopathy.  Neurological: She is alert and oriented to person, place, and time.  Skin: Skin is warm and dry.  Psychiatric: Her behavior is normal.    Data Reviewed GALLBLADDER; CHOLECYSTECTOMY:  - ACUTE ON CHRONIC CHOLECYSTITIS.  - CHOLELITHIASIS.  - NEGATIVE FOR DYSPLASIA AND MALIGNANCY.    Assessment    Doing well status post cholecystectomy    Plan         Follow up as needed. The patient is aware to call back for any questions or concerns.   This information has been scribed by Karie Fetch RN, BSN,BC.  Robert Bellow 06/05/2016, 1:20 PM

## 2017-01-28 ENCOUNTER — Other Ambulatory Visit: Payer: Self-pay | Admitting: Internal Medicine

## 2017-01-28 DIAGNOSIS — Z1231 Encounter for screening mammogram for malignant neoplasm of breast: Secondary | ICD-10-CM

## 2017-03-11 ENCOUNTER — Ambulatory Visit
Admission: RE | Admit: 2017-03-11 | Discharge: 2017-03-11 | Disposition: A | Discharge status: Home / Self Care | Payer: Medicare Other | Source: Ambulatory Visit | Attending: Internal Medicine | Admitting: Internal Medicine

## 2017-03-11 DIAGNOSIS — Z1231 Encounter for screening mammogram for malignant neoplasm of breast: Secondary | ICD-10-CM | POA: Insufficient documentation

## 2017-03-11 IMAGING — MG MM DIGITAL SCREENING BILAT W/ TOMO W/ CAD
9 of 12 series · 9 of 28 positions shown · non-contrast
Comparison: Previous exam(s).

CLINICAL DATA: Screening.

EXAM:
2D DIGITAL SCREENING BILATERAL MAMMOGRAM WITH CAD AND ADJUNCT TOMO

[R MLO]
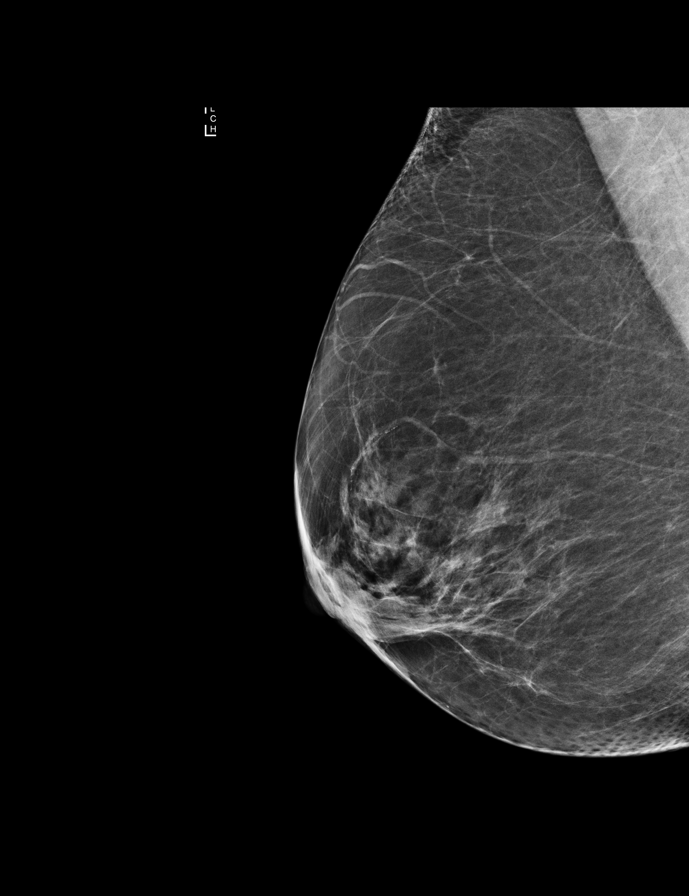

[R CC]
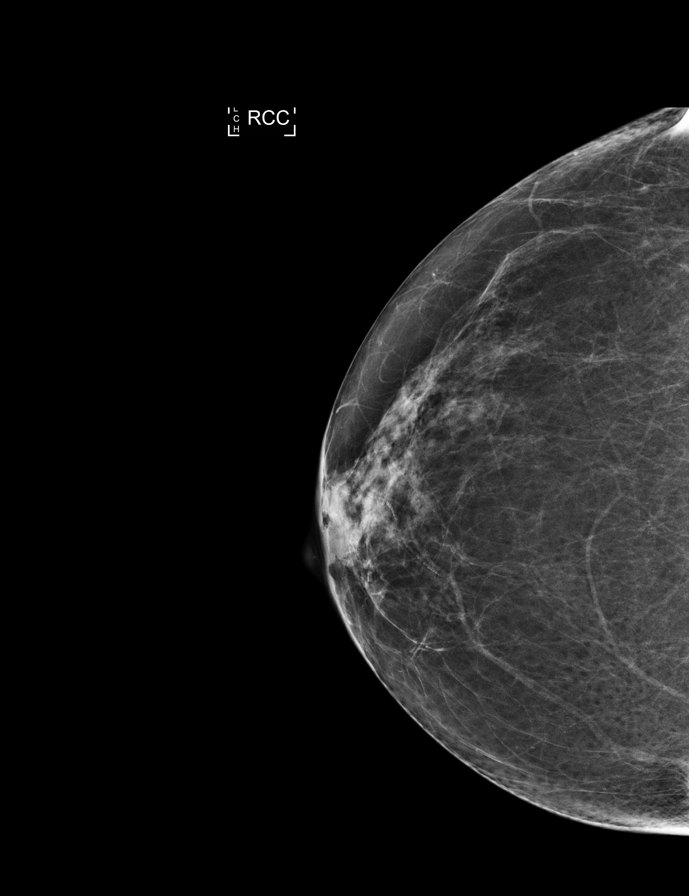

[L MLO synth-2D]
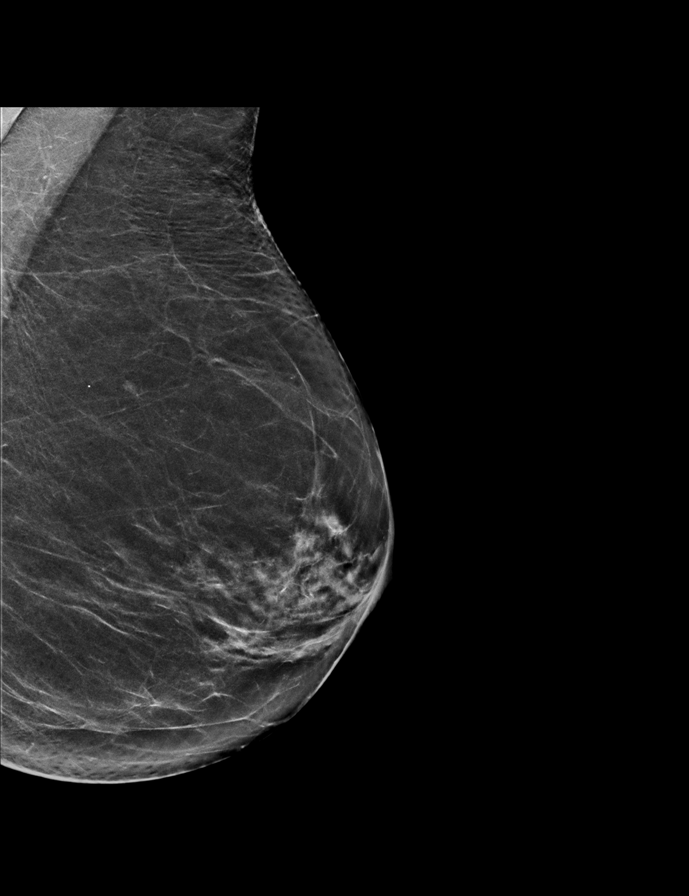

[L MLO]
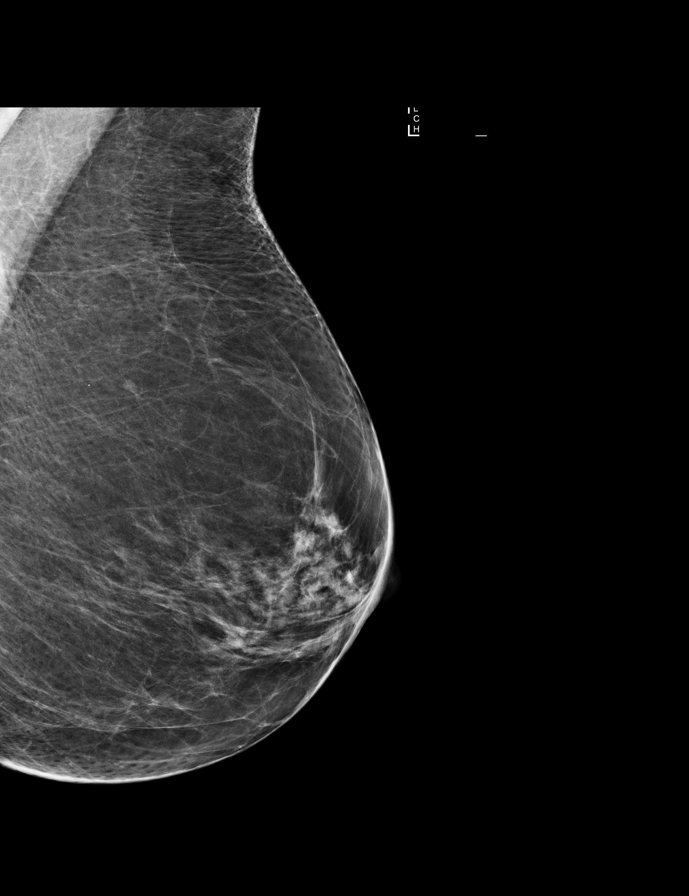

[R MLO synth-2D]
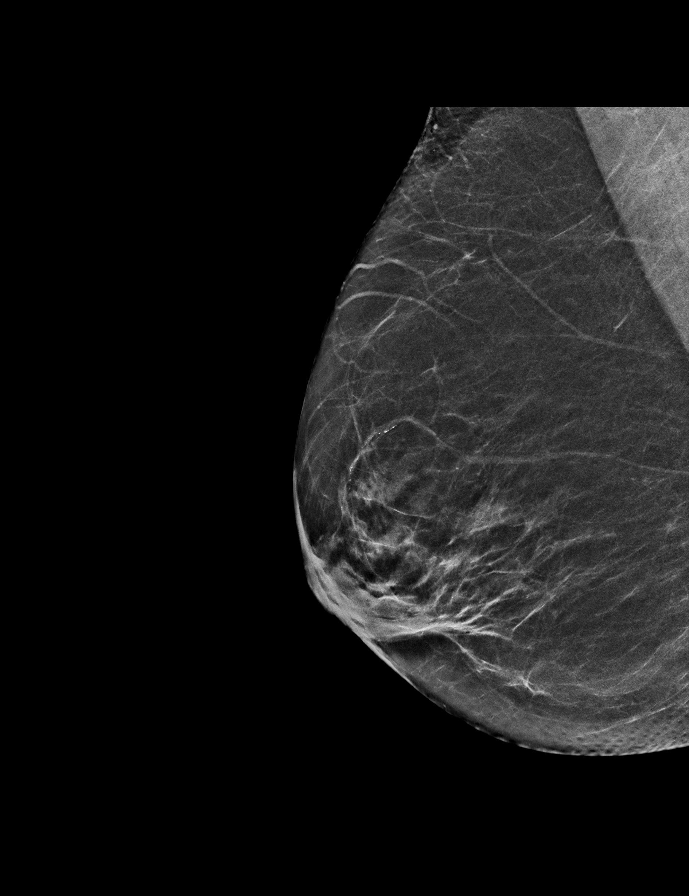

[L CC synth-2D]
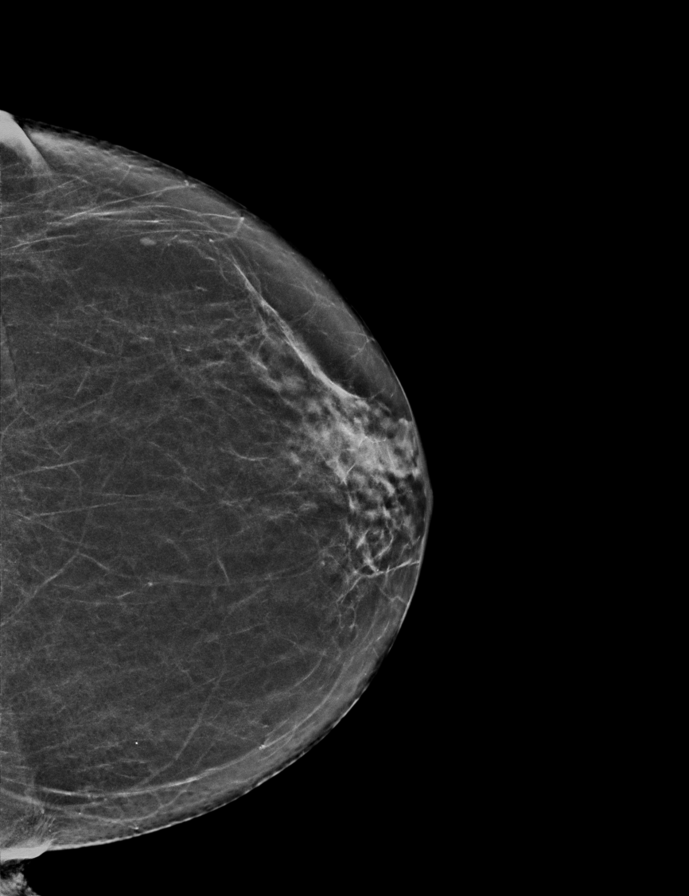

[L CC]
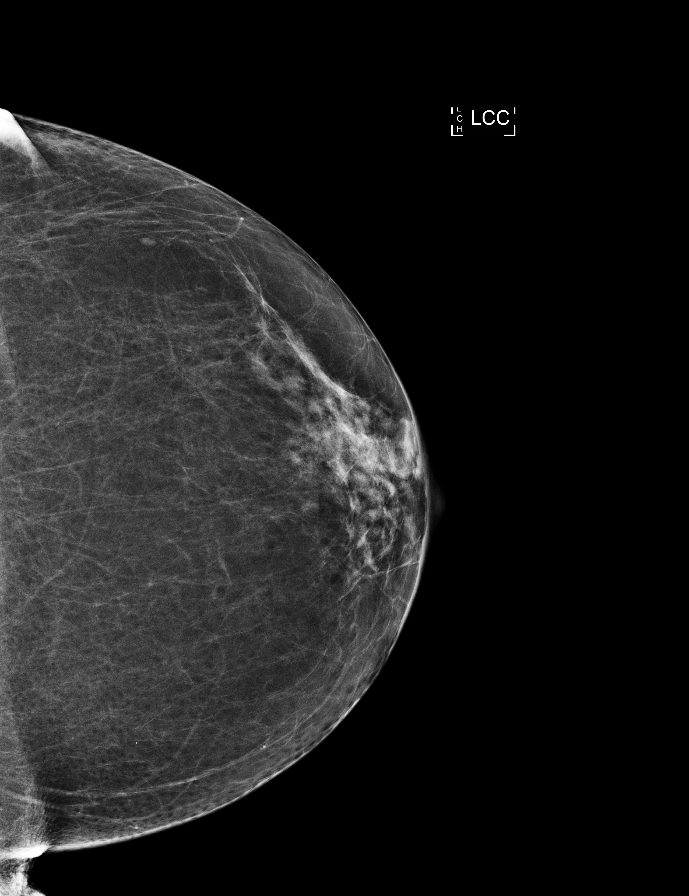

[R CC synth-2D]
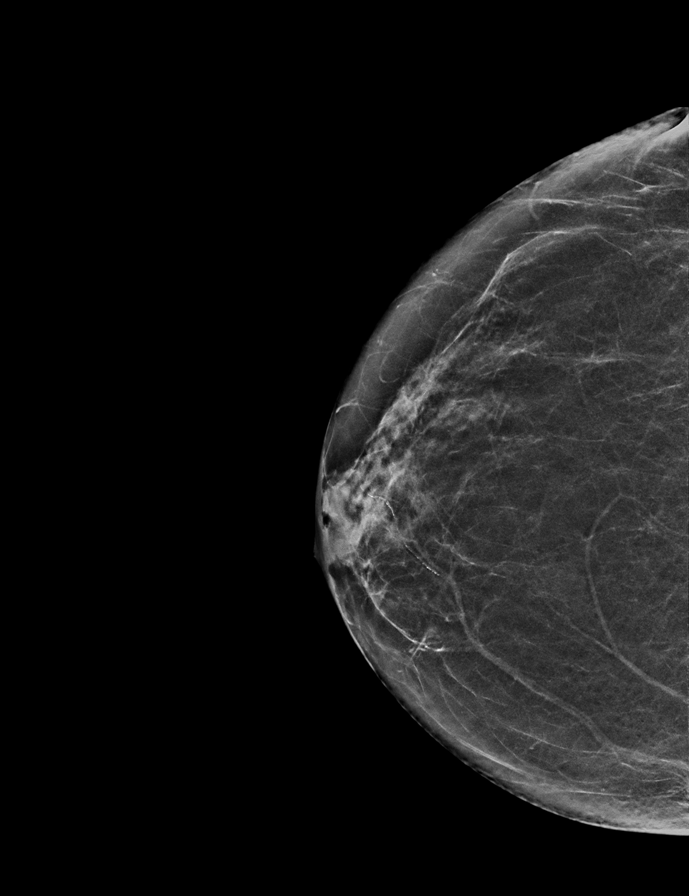

[L MLO tomo · tomo slice 31/60.0]
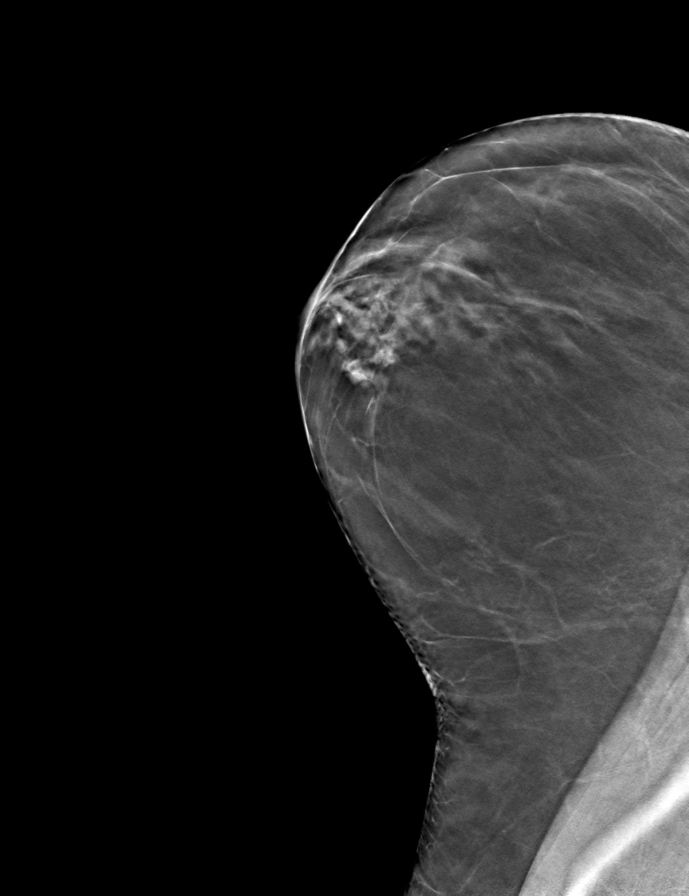

[9 of 28 positions shown; findings below may reference images not displayed]

ACR Breast Density Category b: There are scattered areas of
fibroglandular density.
FINDINGS: There are no findings suspicious for malignancy. Images were
processed with CAD.
IMPRESSION: No mammographic evidence of malignancy. A result letter of this
screening mammogram will be mailed directly to the patient.

RECOMMENDATION:
Screening mammogram in one year. (Code:[33])

BI-RADS CATEGORY  1: Negative.

## 2017-04-03 IMAGING — US US ABDOMEN COMPLETE
1 series · 13 of 25 positions shown · non-contrast
Comparison: CT Abdomen and Pelvis [DATE] and earlier.

ADDENDUM:
Study discussed by telephone with Dr. INES on [DATE] at
[Q9] hours.
CLINICAL DATA: 88-year-old female with right upper quadrant
abdominal pain and possible acute cholecystitis on CT Abdomen and
Pelvis yesterday. Initial encounter.

EXAM:
ABDOMEN ULTRASOUND COMPLETE

[Series 1: us abdomen complete · 0.19mm/px · 13 of 109 slices shown]
[im 1/109]
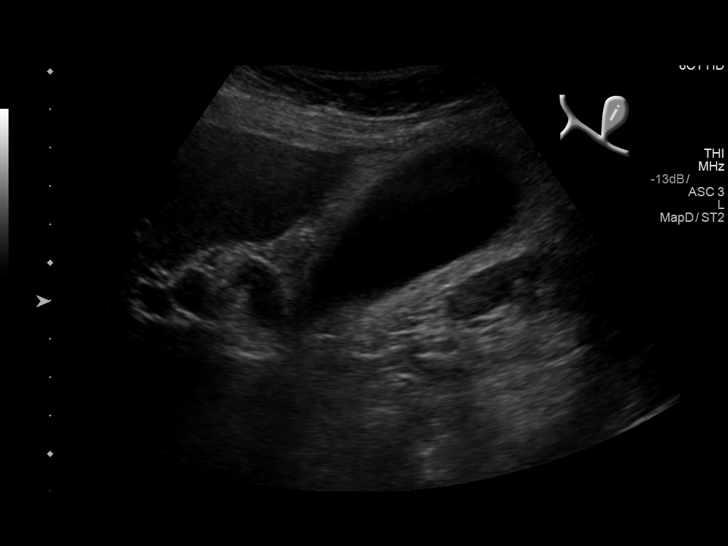
[im 10/109]
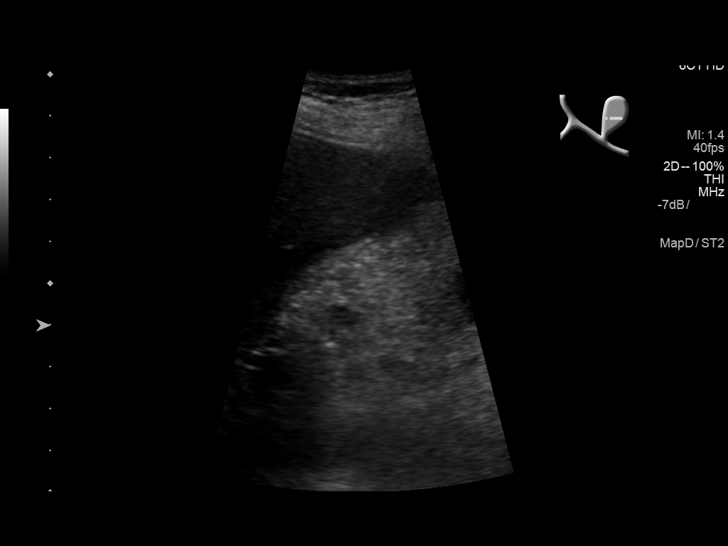
[im 19/109]
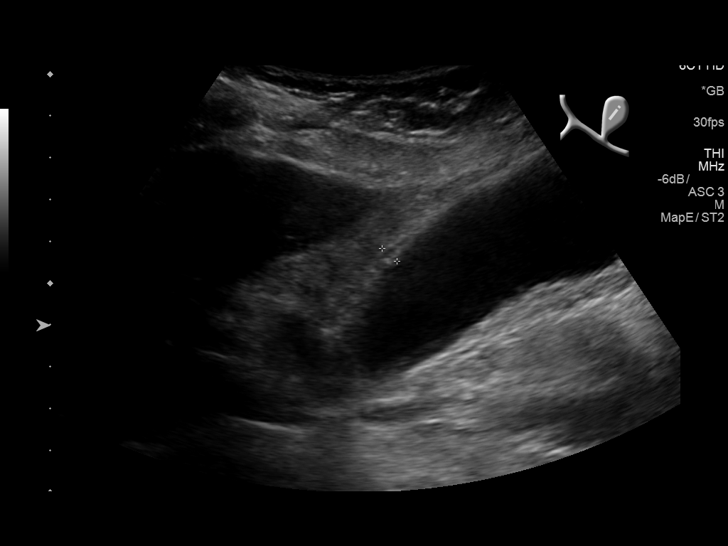
[im 28/109]
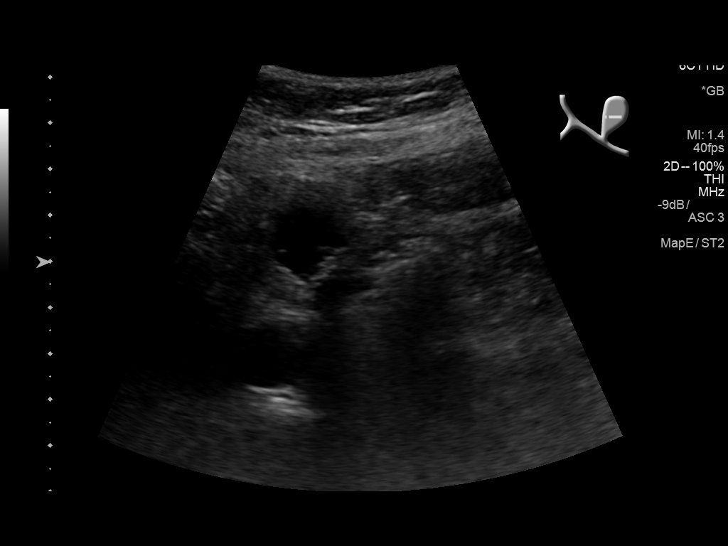
[im 37/109]
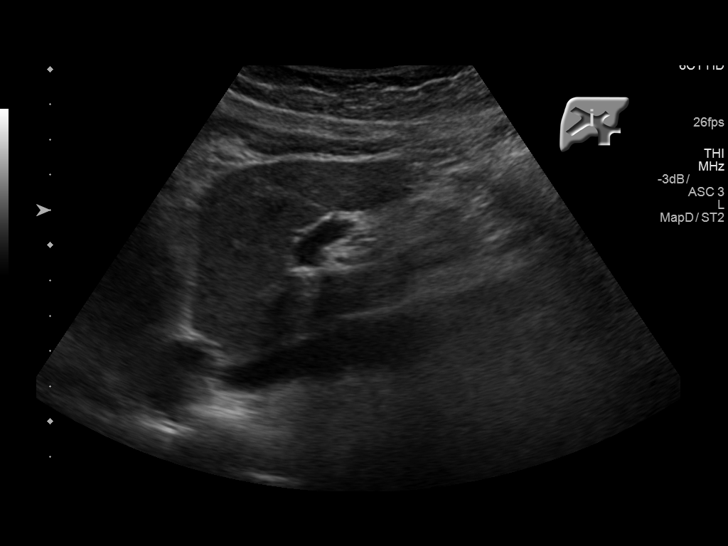
[im 46/109]
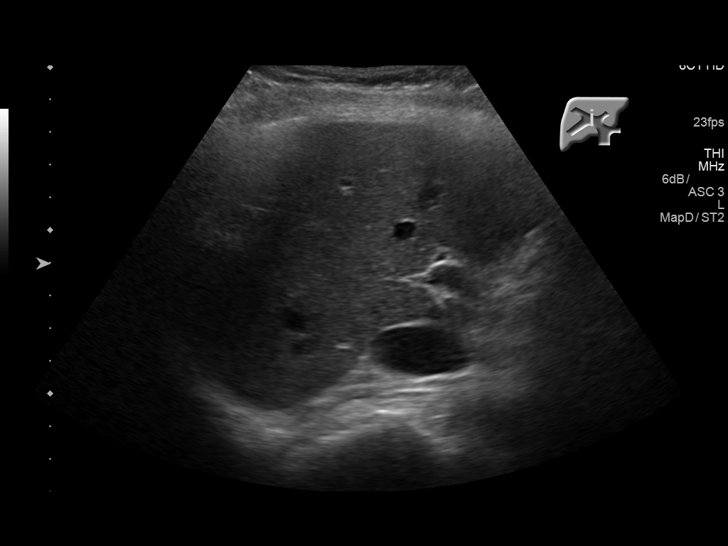
[im 55/109]
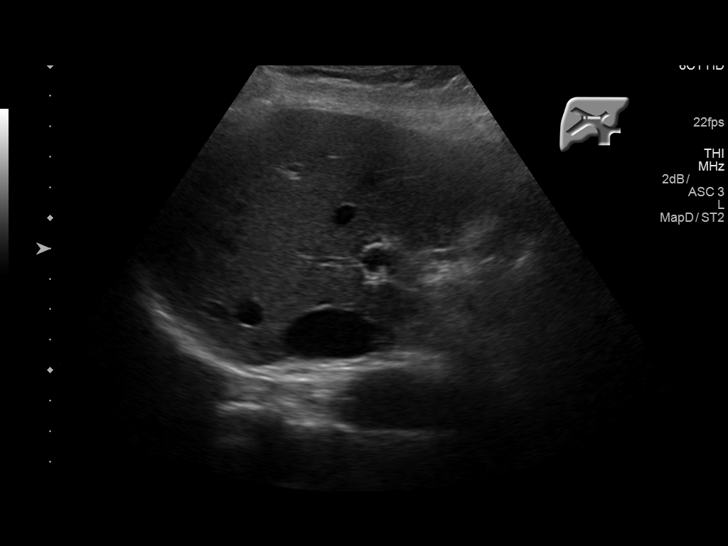
[im 64/109]
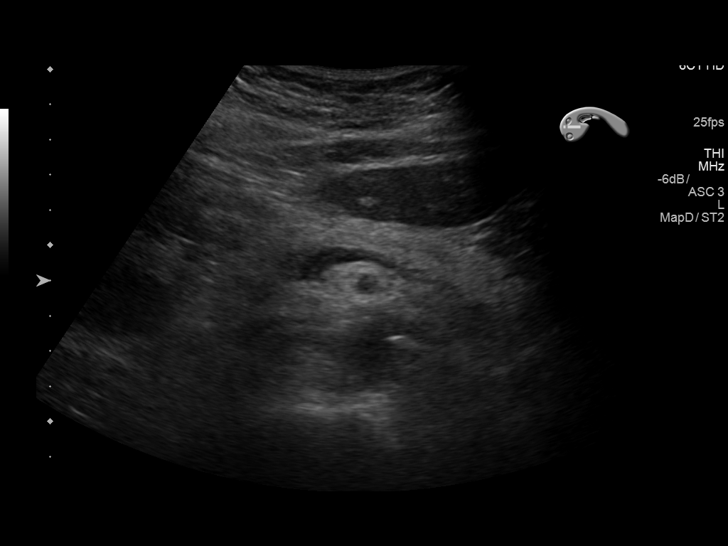
[im 73/109]
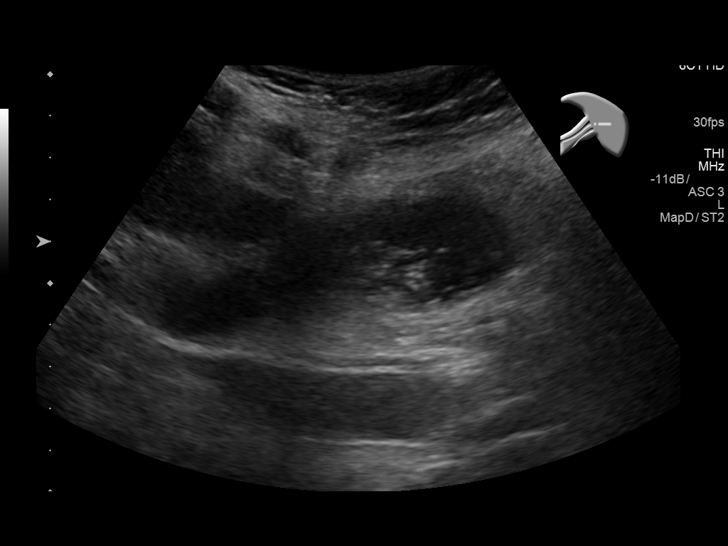
[im 82/109]
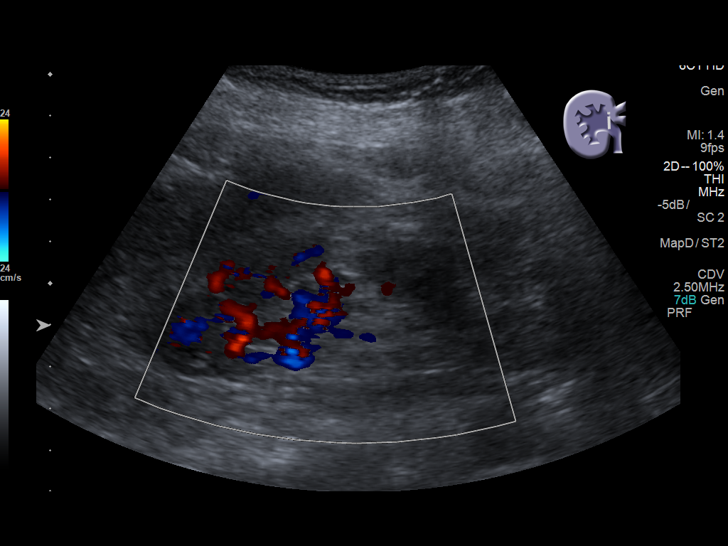
[im 91/109]
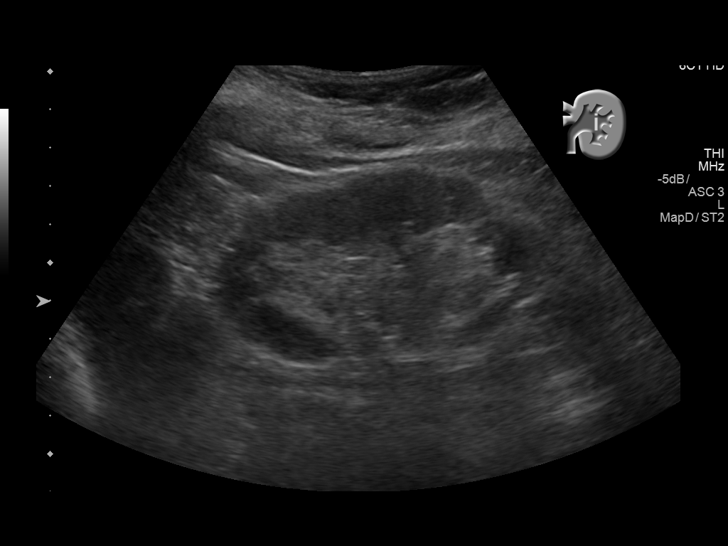
[im 100/109]
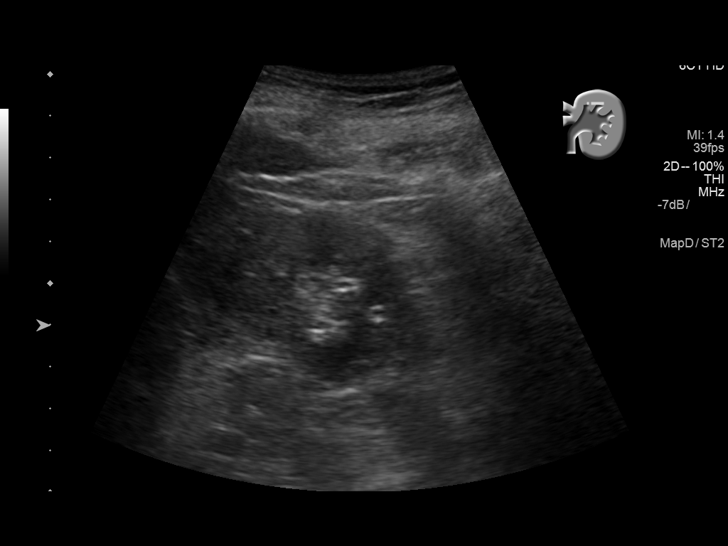
[im 109/109]
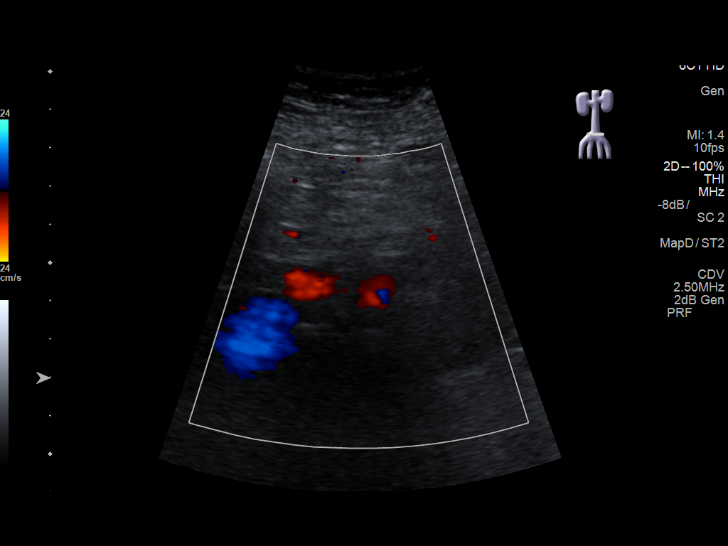

[13 of 25 positions shown; findings below may reference images not displayed]

FINDINGS: Gallbladder: Diffusely thickened and edematous appearing gallbladder
wall. Wall thickness 5 mm. Previously-seen 4-5 mm gallstone in the
neck of the gallbladder was easier to visualize by CT but does
appear to be demonstrated on image 13, and is nonmobile.
Superimposed sludge. No sonographic Murphy sign was elicited.

Common bile duct: Diameter: 5 mm, normal.

Liver: No focal lesion identified. Within normal limits in
parenchymal echogenicity.

IVC: No abnormality visualized.

Pancreas: Visualized portion unremarkable.

Spleen: Size and appearance within normal limits.

Right Kidney: Length: 9.9 cm. Echogenicity within normal limits. No
mass or hydronephrosis visualized.

Left Kidney: Length: 80.6 cm. Echogenicity within normal limits. No
mass or hydronephrosis visualized.

Abdominal aorta: Irregularity in keeping with the Calcified aortic
atherosclerosis depicted yesterday. No abdominal aortic aneurysm.

Other findings: None.
IMPRESSION: 1. Gallbladder wall thickening/edema with 5 mm stone lodged in the
gallbladder neck most compatible with acute cholecystitis.
2. Otherwise negative abdomen ultrasound.

## 2017-09-17 ENCOUNTER — Other Ambulatory Visit: Payer: Self-pay

## 2017-09-17 ENCOUNTER — Emergency Department: Payer: Medicare Other

## 2017-09-17 ENCOUNTER — Emergency Department
Admission: EM | Admit: 2017-09-17 | Discharge: 2017-09-18 | Disposition: A | Discharge status: Other Acute Inpatient Hospital | Payer: Medicare Other | Attending: Emergency Medicine | Admitting: Emergency Medicine

## 2017-09-17 ENCOUNTER — Encounter: Payer: Self-pay | Admitting: Emergency Medicine

## 2017-09-17 DIAGNOSIS — Y9389 Activity, other specified: Secondary | ICD-10-CM | POA: Insufficient documentation

## 2017-09-17 DIAGNOSIS — S52131B Displaced fracture of neck of right radius, initial encounter for open fracture type I or II: Secondary | ICD-10-CM | POA: Insufficient documentation

## 2017-09-17 DIAGNOSIS — E039 Hypothyroidism, unspecified: Secondary | ICD-10-CM | POA: Diagnosis not present

## 2017-09-17 DIAGNOSIS — Y998 Other external cause status: Secondary | ICD-10-CM | POA: Insufficient documentation

## 2017-09-17 DIAGNOSIS — Z8572 Personal history of non-Hodgkin lymphomas: Secondary | ICD-10-CM | POA: Insufficient documentation

## 2017-09-17 DIAGNOSIS — Z79899 Other long term (current) drug therapy: Secondary | ICD-10-CM | POA: Insufficient documentation

## 2017-09-17 DIAGNOSIS — W108XXA Fall (on) (from) other stairs and steps, initial encounter: Secondary | ICD-10-CM | POA: Insufficient documentation

## 2017-09-17 DIAGNOSIS — S51801A Unspecified open wound of right forearm, initial encounter: Secondary | ICD-10-CM | POA: Insufficient documentation

## 2017-09-17 DIAGNOSIS — I6782 Cerebral ischemia: Secondary | ICD-10-CM | POA: Diagnosis not present

## 2017-09-17 DIAGNOSIS — Y929 Unspecified place or not applicable: Secondary | ICD-10-CM | POA: Diagnosis not present

## 2017-09-17 DIAGNOSIS — Z23 Encounter for immunization: Secondary | ICD-10-CM | POA: Insufficient documentation

## 2017-09-17 DIAGNOSIS — T148XXA Other injury of unspecified body region, initial encounter: Secondary | ICD-10-CM

## 2017-09-17 DIAGNOSIS — S52021B Displaced fracture of olecranon process without intraarticular extension of right ulna, initial encounter for open fracture type I or II: Secondary | ICD-10-CM | POA: Insufficient documentation

## 2017-09-17 DIAGNOSIS — Z9049 Acquired absence of other specified parts of digestive tract: Secondary | ICD-10-CM | POA: Insufficient documentation

## 2017-09-17 DIAGNOSIS — S59901A Unspecified injury of right elbow, initial encounter: Secondary | ICD-10-CM | POA: Diagnosis present

## 2017-09-17 LAB — COMPREHENSIVE METABOLIC PANEL
ALT: 15 U/L (ref 14–54)
AST: 24 U/L (ref 15–41)
Albumin: 3.8 g/dL (ref 3.5–5.0)
Alkaline Phosphatase: 72 U/L (ref 38–126)
Anion gap: 10 (ref 5–15)
BUN: 22 mg/dL — ABNORMAL HIGH (ref 6–20)
CHLORIDE: 100 mmol/L — AB (ref 101–111)
CO2: 24 mmol/L (ref 22–32)
Calcium: 9.1 mg/dL (ref 8.9–10.3)
Creatinine, Ser: 1.21 mg/dL — ABNORMAL HIGH (ref 0.44–1.00)
GFR, EST AFRICAN AMERICAN: 45 mL/min — AB (ref >60)
GFR, EST NON AFRICAN AMERICAN: 39 mL/min — AB (ref >60)
Glucose, Bld: 138 mg/dL — ABNORMAL HIGH (ref 65–99)
POTASSIUM: 3.7 mmol/L (ref 3.5–5.1)
Sodium: 134 mmol/L — ABNORMAL LOW (ref 135–145)
Total Bilirubin: 0.7 mg/dL (ref 0.3–1.2)
Total Protein: 6.7 g/dL (ref 6.5–8.1)

## 2017-09-17 LAB — CBC
HCT: 39.2 % (ref 35.0–47.0)
Hemoglobin: 12.8 g/dL (ref 12.0–16.0)
MCH: 29.2 pg (ref 26.0–34.0)
MCHC: 32.6 g/dL (ref 32.0–36.0)
MCV: 89.4 fL (ref 80.0–100.0)
PLATELETS: 242 10*3/uL (ref 150–440)
RBC: 4.39 MIL/uL (ref 3.80–5.20)
RDW: 14.2 % (ref 11.5–14.5)
WBC: 15.6 10*3/uL — ABNORMAL HIGH (ref 3.6–11.0)

## 2017-09-17 IMAGING — DX DG ELBOW COMPLETE 3+V*R*
4 series · 4 of 4 positions shown · non-contrast
Comparison: None.

CLINICAL DATA: Fell and hit elbow on brick wall.

EXAM:
RIGHT ELBOW - COMPLETE 3+ VIEW

[elbow ap]
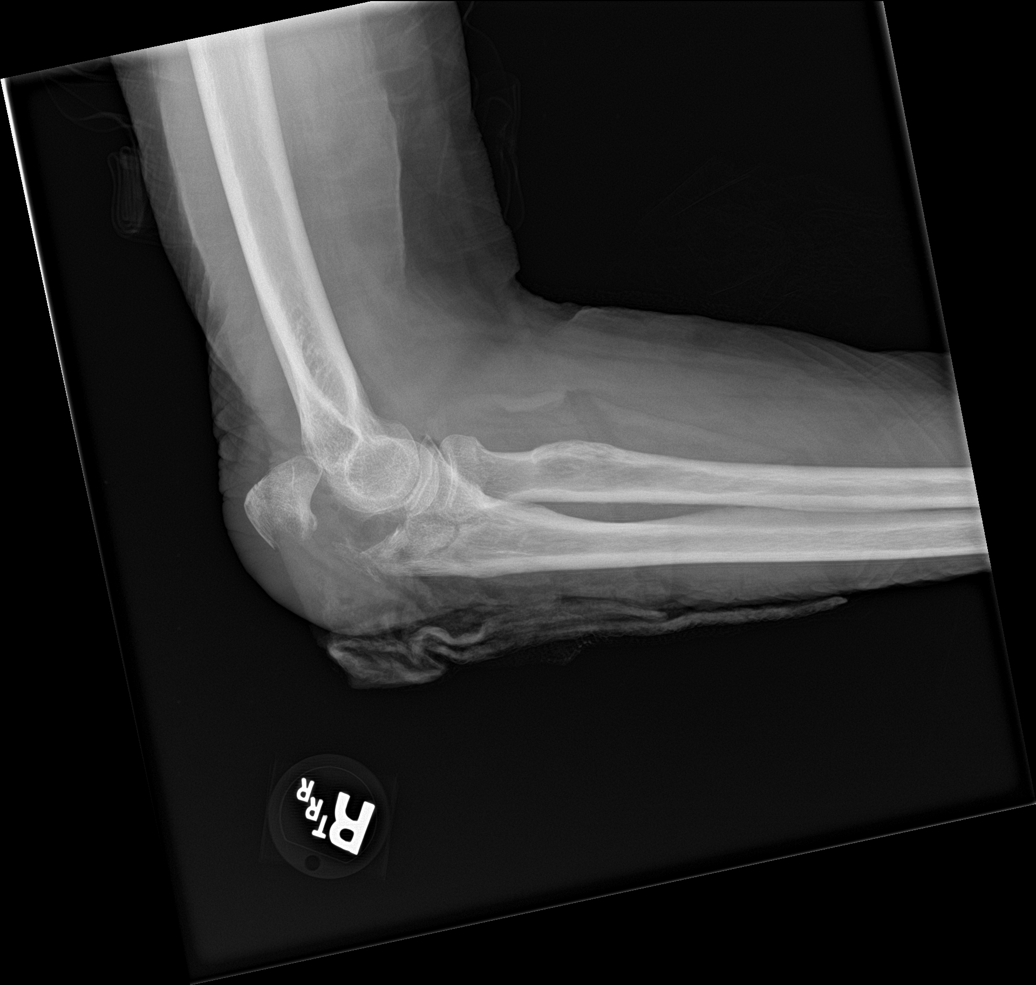

[elbow obl (1 of 2)]
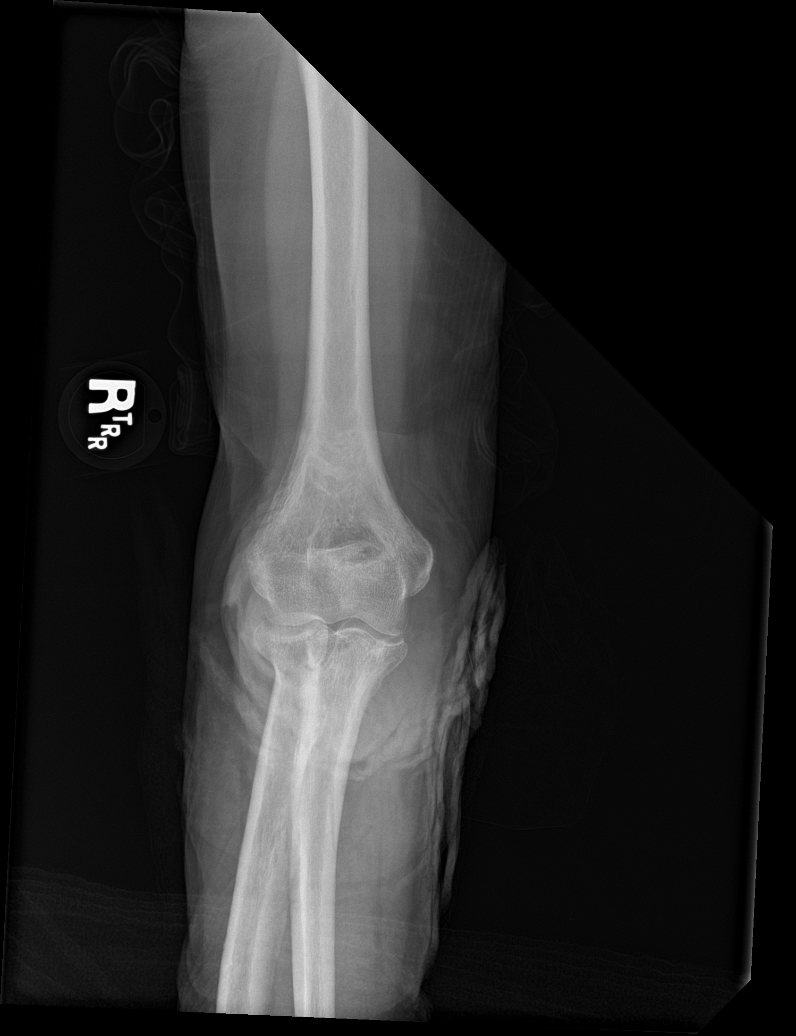

[elbow obl (2 of 2)]
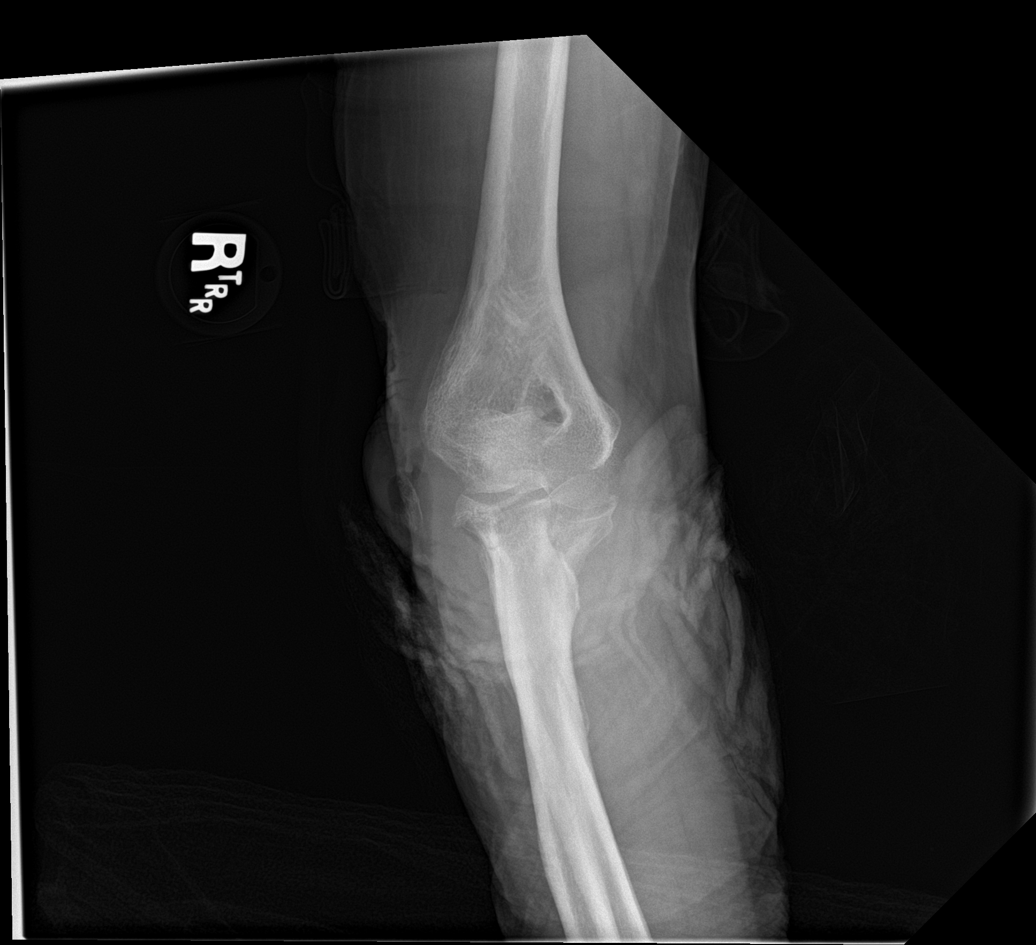

[elbow lat]
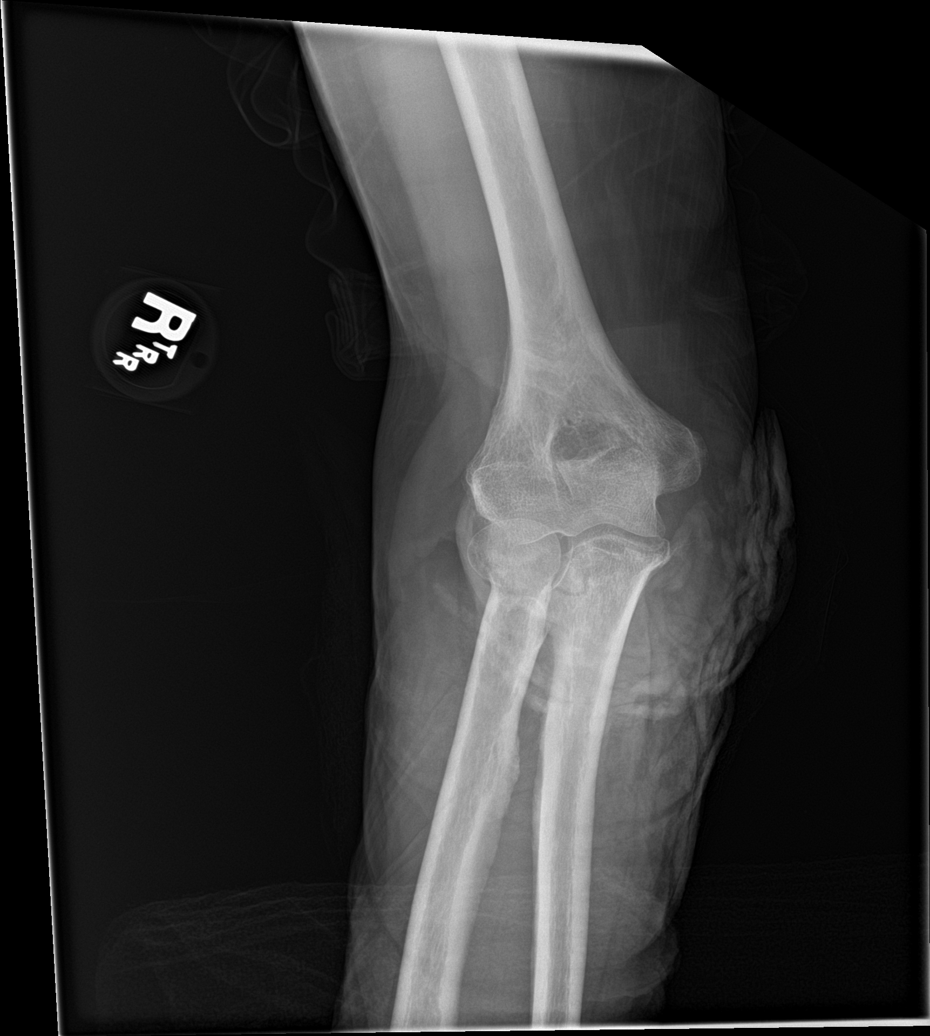

[4 of 4 positions shown; findings below may reference images not displayed]

FINDINGS: Comminuted fracture of the olecranon with a large posterior
displaced bone fragment. Radial head appears to be located but there
is a minimally displaced fracture of the radial neck. Distal humerus
is intact. Large soft tissue bandage along the posterior aspect of
the elbow and forearm. Extensive soft tissue swelling along the
olecranon.
IMPRESSION: Comminuted and displaced fracture involving the olecranon.

Fracture of the proximal right radius.

## 2017-09-17 IMAGING — CT CT HEAD W/O CM
4 series · 16 of 47 positions shown, 18 images · non-contrast
Comparison: None.

CLINICAL DATA: Fell down stairs

EXAM:
CT HEAD WITHOUT CONTRAST
TECHNIQUE: Contiguous axial images were obtained from the base of the skull
through the vertex without intravenous contrast.

[Series 2: head wo · axial · 0.40mm/px · z∈[+477,+582]mm · 7 of 29 slices shown, 9 images]
[im 4/29  brain]
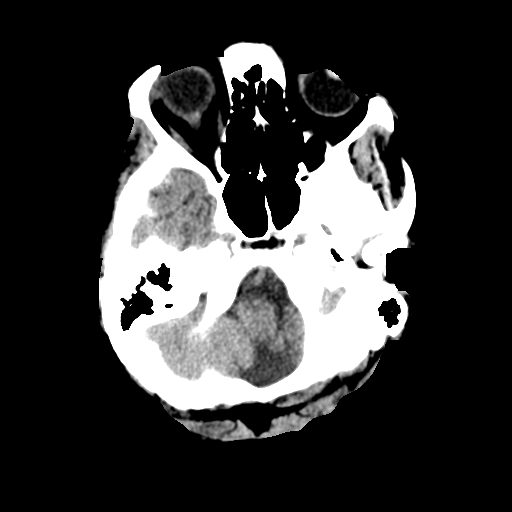
[im 4/29  bone]
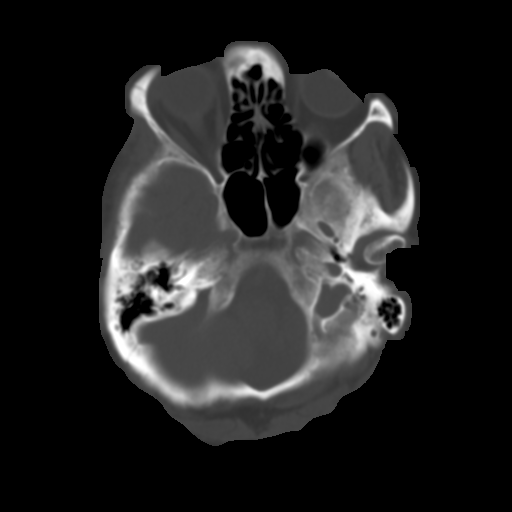
[im 8/29  brain]
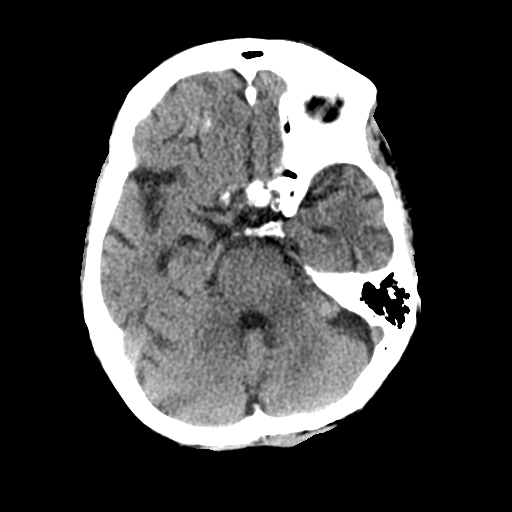
[im 11/29  brain]
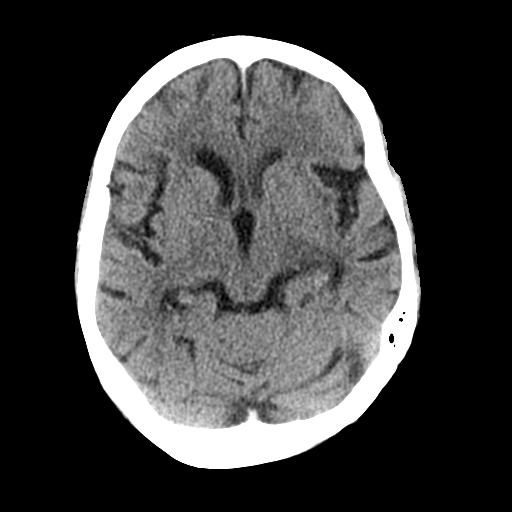
[im 15/29  brain]
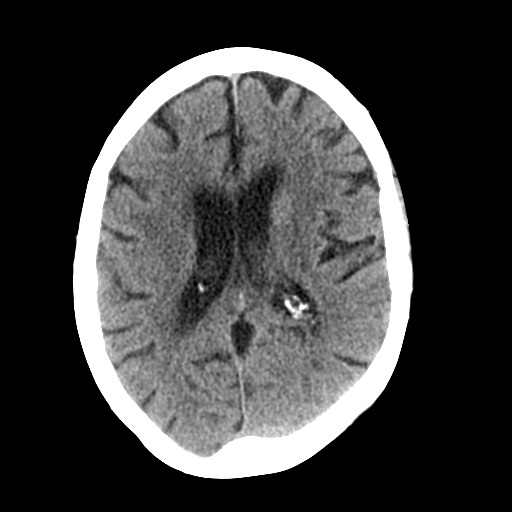
[im 18/29  brain]
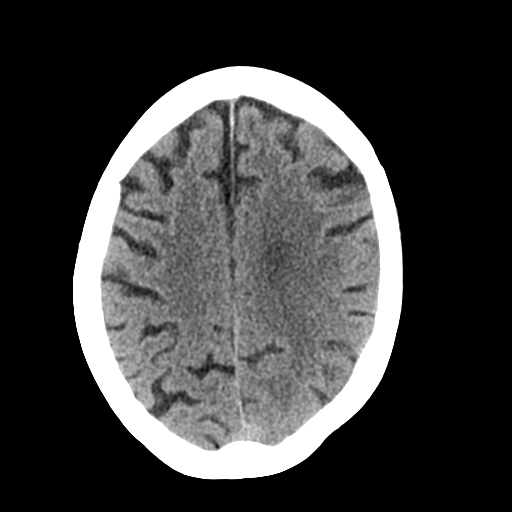
[im 18/29  bone]
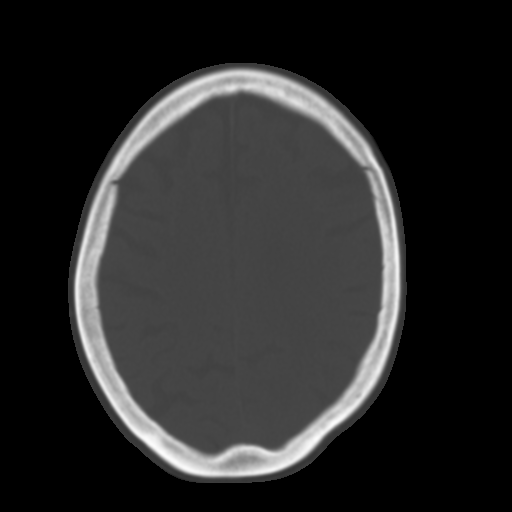
[im 22/29  brain]
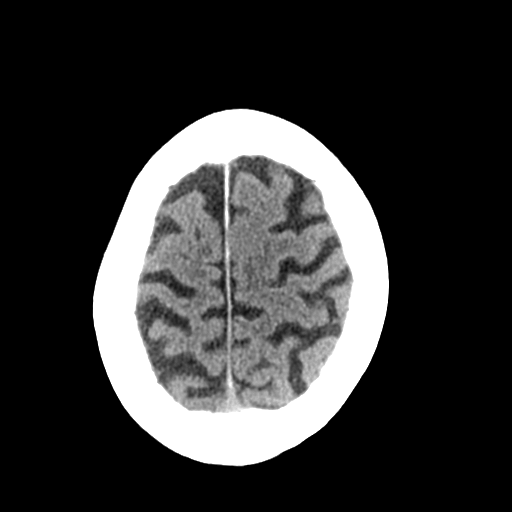
[im 25/29  brain]
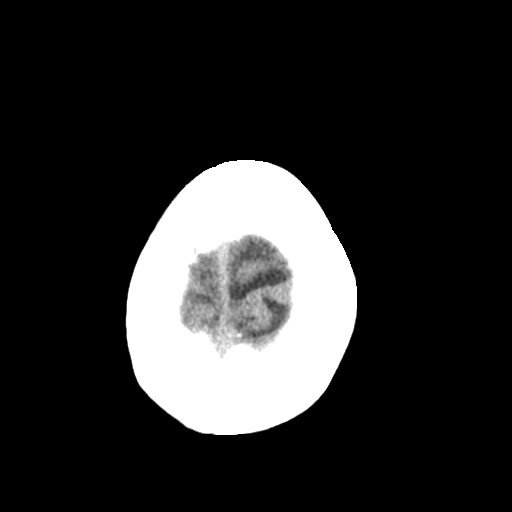

[Series 3: head bone · axial · 0.40mm/px · z∈[+476,+504]mm · 3 of 73 slices shown]
[im 8/73  bone]
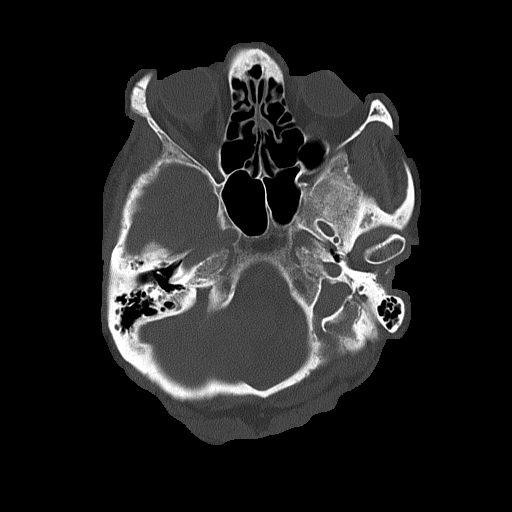
[im 15/73  bone]
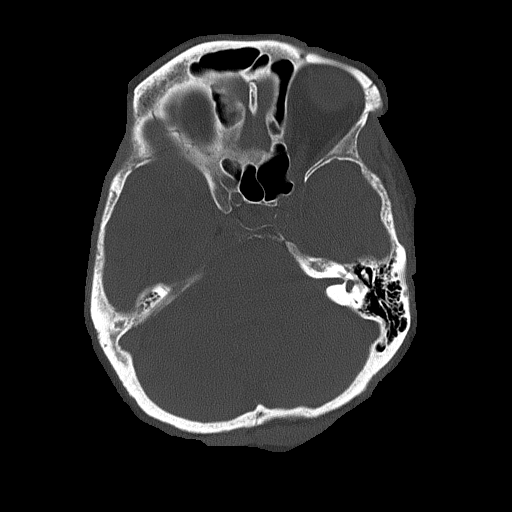
[im 22/73  bone]
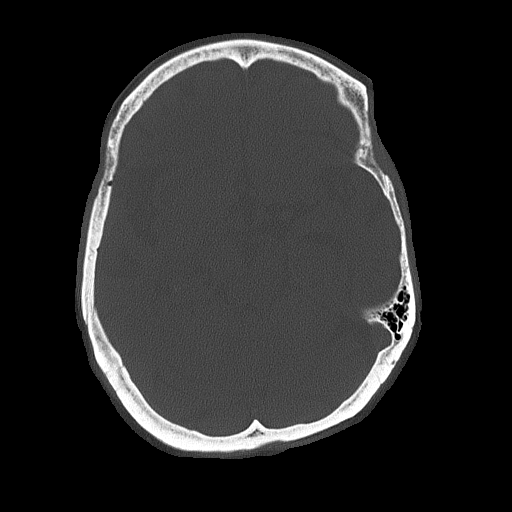

[Series 4: coronal soft tissue · coronal · 0.29mm/px · 3 of 61 slices shown]
[im 21/61  brain]
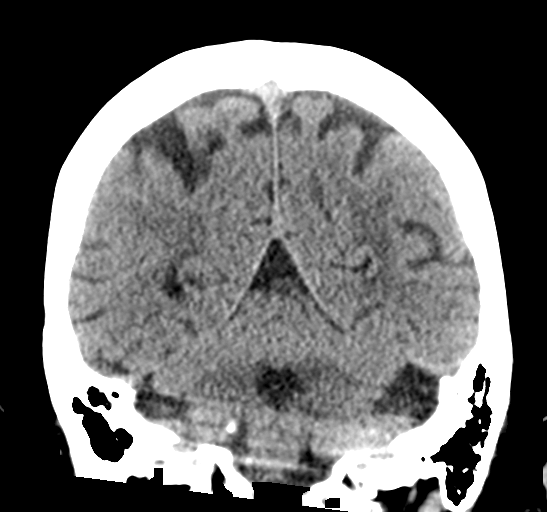
[im 27/61  brain]
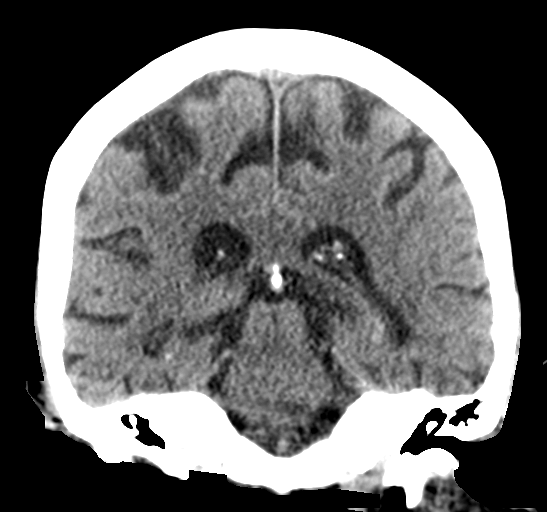
[im 34/61  brain]
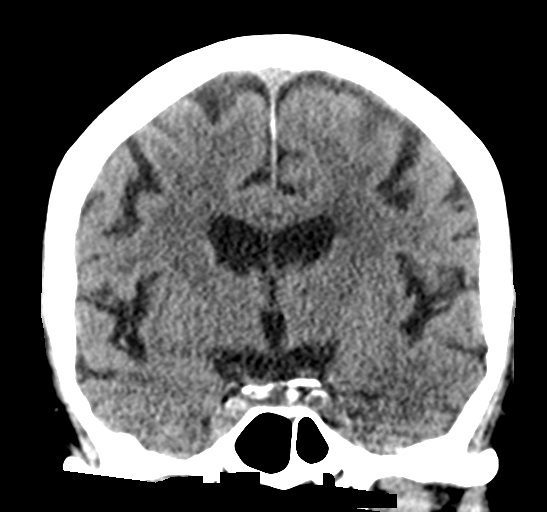

[Series 5: sagittal soft tissue · sagittal · 0.29mm/px · 3 of 47 slices shown]
[im 16/47  brain]
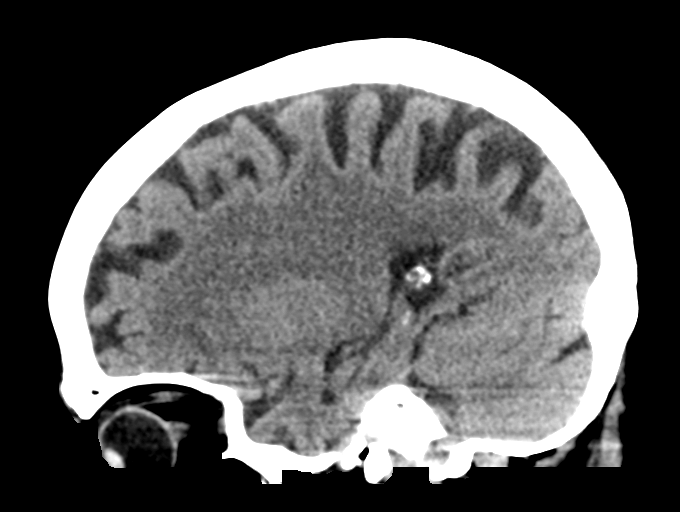
[im 24/47  brain]
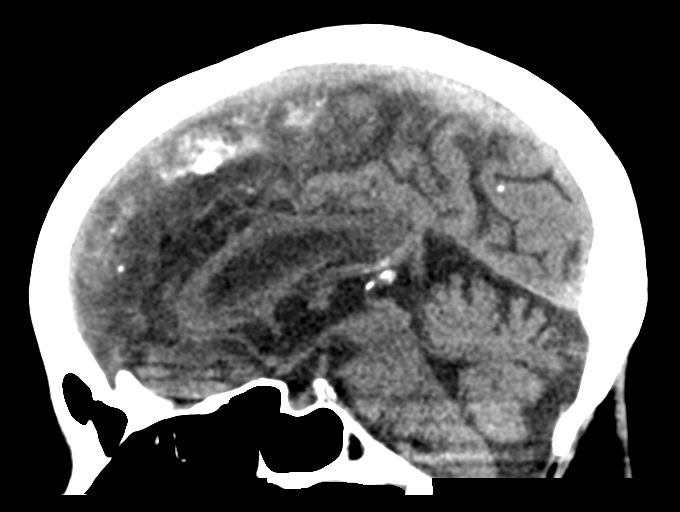
[im 31/47  brain]
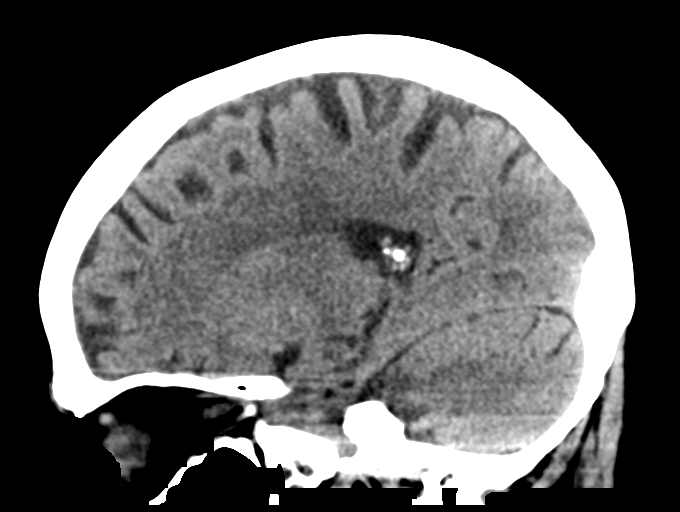

[16 of 47 positions shown; findings below may reference images not displayed]

FINDINGS: Brain: No acute territorial infarction, hemorrhage or intracranial
mass is visualized. Mild atrophy and minimal small vessel ischemic
changes of the white matter. Nonenlarged ventricles.

Vascular: No hyperdense vessels. Scattered calcifications at the
carotid siphons.

Skull: No fracture or suspicious lesion

Sinuses/Orbits: Mild mucosal thickening in the ethmoid sinuses. No
acute orbital abnormality

Other: None
IMPRESSION: No CT evidence for acute intracranial abnormality. Mild atrophy and
small vessel ischemic changes of the white matter

## 2017-09-17 MED ORDER — OXYCODONE-ACETAMINOPHEN 5-325 MG PO TABS
1.0000 | ORAL_TABLET | Freq: Once | ORAL | Status: AC
  Administered 2017-09-17: 1 via ORAL
  Filled 2017-09-17: qty 1

## 2017-09-17 MED ORDER — TETANUS-DIPHTH-ACELL PERTUSSIS 5-2.5-18.5 LF-MCG/0.5 IM SUSP
0.5000 mL | Freq: Once | INTRAMUSCULAR | Status: AC
  Administered 2017-09-17: 0.5 mL via INTRAMUSCULAR
  Filled 2017-09-17: qty 0.5

## 2017-09-17 NOTE — ED Triage Notes (Signed)
Pt presents to ED via EMS from home after she fell and hit her right elbow on a brick wall. Pt states she was walking down stairs while carrying something and slipped and fell down a couple of stairs. Pt denies hitting her head and reports her only injury is a skin tear on her right elbow. Bruising noted. bleeding controlled. +movement and sensation.

## 2017-09-18 MED ORDER — CEFAZOLIN SODIUM-DEXTROSE 1-4 GM/50ML-% IV SOLN
1.0000 g | Freq: Once | INTRAVENOUS | Status: DC
  Filled 2017-09-18: qty 50

## 2017-09-18 MED ORDER — DEXTROSE 5 % IV SOLN
1000.0000 mg | Freq: Once | INTRAVENOUS | Status: DC
  Filled 2017-09-18: qty 10

## 2017-09-18 NOTE — ED Notes (Signed)
Mallie Mussel RN called the pharmacy to get the Pt's medication.

## 2017-09-18 NOTE — ED Provider Notes (Signed)
[facility] Emergency Department Provider Note  ____________________________________________  Time seen: Approximately 12:30 AM  I have reviewed the triage vital signs and the nursing notes.   HISTORY  Chief Complaint Extremity Laceration    HPI Jeanette Newton is a 84 y.o. female presents to emergency department for evaluation of right elbow pain after falling down about 3 steps today.  Patient was carrying sausage down the steps.  She thinks that she is tripped but is not 100% sure this is what caused her fall.  She remembers hitting the ground.  She did not hit her head or lose consciousness.  She is unsure of last tetanus shot.  No headache, neck pain, shortness of breath, chest pain, nausea, vomiting, abdominal pain, numbness, tingling.  Past Medical History:  Diagnosis Date  . Cancer [facility]) 2005-2006   lymphoma  . Hypothyroidism   . Status post chemoradiation    lymphoma  . Status post radiation therapy   . Thyroid disease     Patient Active Problem List   Diagnosis Date Noted  . Calculus of gallbladder with acute cholecystitis without obstruction 05/15/2016    Past Surgical History:  Procedure Laterality Date  . ABDOMINAL EXPLORATION SURGERY  2005-2006   Dr Pat Patrick  . CHOLECYSTECTOMY N/A 05/25/2016   Procedure: LAPAROSCOPIC CHOLECYSTECTOMY WITH INTRAOPERATIVE CHOLANGIOGRAM;  Surgeon: Robert Bellow, MD;  Location: [facility] ORS;  Service: General;  Laterality: N/A;    Prior to Admission medications   Medication Sig Start Date End Date Taking? Authorizing Provider  Calcium-Vitamin D 600-200 MG-UNIT tablet Take 1 tablet by mouth 2 (two) times daily.     [provider]  levothyroxine (SYNTHROID, LEVOTHROID) 75 MCG tablet Take 75 mcg by mouth daily before breakfast.  06/30/15   [provider]  Omega-3 Fatty Acids (FISH OIL PO) Take by mouth daily.     [provider]    Allergies Clarithromycin; Clindamycin;  Ciprofloxacin; Doxycycline hyclate; Etodolac; Levaquin [levofloxacin in d5w]; Penicillins; and Propranolol  Family History  Problem Relation Age of Onset  . Breast cancer Neg Hx     Social History Social History   Tobacco Use  . Smoking status: Never Smoker  . Smokeless tobacco: Never Used  Substance Use Topics  . Alcohol use: No  . Drug use: No     Review of Systems  Cardiovascular: No chest pain. Respiratory:  No SOB. Gastrointestinal: No abdominal pain.  No nausea, no vomiting.  Musculoskeletal: Positive for elbow pain Skin: Negative for rash, ecchymosis. Neurological: Negative for numbness or tingling   ____________________________________________   PHYSICAL EXAM:  VITAL SIGNS: ED Triage Vitals  Enc Vitals Group     BP 09/17/17 2117 92/78     Pulse Rate 09/17/17 2117 61     Resp 09/17/17 2117 18     Temp 09/17/17 2117 97.6 F (36.4 C)     Temp Source 09/17/17 2117 Oral     SpO2 09/17/17 2117 98 %     Weight 09/17/17 2122 135 lb (61.2 kg)     Height 09/17/17 2122 5\' 2"  (1.575 m)     Head Circumference --      Peak Flow --      Pain Score 09/17/17 2118 10     Pain Loc --      Pain Edu? --      Excl. in [place]? --      Constitutional: Alert and oriented. Well appearing and in no acute distress. Eyes: Conjunctivae are normal. PERRL.  EOMI. Head: Atraumatic. ENT:      Ears:      Nose: No congestion/rhinnorhea.      Mouth/Throat: Mucous membranes are moist.  Neck: No stridor.  Cardiovascular: Normal rate, regular rhythm.  Good peripheral circulation.  Symmetric radial pulses bilaterally. Respiratory: Normal respiratory effort without tachypnea or retractions. Lungs CTAB. Good air entry to the bases with no decreased or absent breath sounds. Gastrointestinal: Bowel sounds 4 quadrants. Soft and nontender to palpation. No guarding or rigidity. No palpable masses. No distention.  Musculoskeletal: Full range of motion to all extremities. No gross deformities  appreciated.  Moderate swelling to right elbow.  Neurologic:  Normal speech and language. No gross focal neurologic deficits are appreciated.  Skin:  Skin is warm, dry. 5cm by 10cm V-shaped avulsion of epidermal layer from forearm to elbow. Subcutaneous fat exposed. No muscle or bone exposed.    ____________________________________________   LABS (all labs ordered are listed, but only abnormal results are displayed)  Labs Reviewed  CBC - Abnormal; Notable for the following components:      Result Value   WBC 15.6 (*)    All other components within normal limits  COMPREHENSIVE METABOLIC PANEL - Abnormal; Notable for the following components:   Sodium 134 (*)    Chloride 100 (*)    Glucose, Bld 138 (*)    BUN 22 (*)    Creatinine, Ser 1.21 (*)    GFR calc non Af Amer 39 (*)    GFR calc Af Amer 45 (*)    All other components within normal limits   ____________________________________________  EKG   ____________________________________________  RADIOLOGY Robinette Haines, personally viewed and evaluated these images (plain radiographs) as part of my medical decision making, as well as reviewing the written report by the radiologist.  Dg Elbow Complete Right  Result Date: 09/17/2017 CLINICAL DATA:  [place] and hit elbow on brick wall. EXAM: RIGHT ELBOW - COMPLETE 3+ VIEW COMPARISON:  None. FINDINGS: Comminuted fracture of the olecranon with a large posterior displaced bone fragment. Radial head appears to be located but there is a minimally displaced fracture of the radial neck. Distal humerus is intact. Large soft tissue bandage along the posterior aspect of the elbow and forearm. Extensive soft tissue swelling along the olecranon. IMPRESSION: Comminuted and displaced fracture involving the olecranon. Fracture of the proximal right radius. Electronically Signed   By: Markus Daft M.D.   On: 09/17/2017 22:29   Ct Head Wo Contrast  Result Date: 09/17/2017 CLINICAL DATA:  [place] down  stairs EXAM: CT HEAD WITHOUT CONTRAST TECHNIQUE: Contiguous axial images were obtained from the base of the skull through the vertex without intravenous contrast. COMPARISON:  None. FINDINGS: Brain: No acute territorial infarction, hemorrhage or intracranial mass is visualized. Mild atrophy and minimal small vessel ischemic changes of the white matter. Nonenlarged ventricles. Vascular: No hyperdense vessels. Scattered calcifications at the carotid siphons. Skull: No fracture or suspicious lesion Sinuses/Orbits: Mild mucosal thickening in the ethmoid sinuses. No acute orbital abnormality Other: None IMPRESSION: No CT evidence for acute intracranial abnormality. Mild atrophy and small vessel ischemic changes of the white matter Electronically Signed   By: Donavan Foil M.D.   On: 09/17/2017 23:07    ____________________________________________    PROCEDURES  Procedure(s) performed:    Procedures    Medications  ceFAZolin (ANCEF) IVPB 1 g/50 mL premix (not administered)  Tdap (BOOSTRIX) injection 0.5 mL (0.5 mLs Intramuscular Given 09/17/17 2346)  oxyCODONE-acetaminophen (PERCOCET/ROXICET) 5-325 MG per tablet  1 tablet (1 tablet Oral Given 09/17/17 2251)     ____________________________________________   INITIAL IMPRESSION / ASSESSMENT AND PLAN / ED COURSE  Pertinent labs & imaging results that were available during my care of the patient were reviewed by me and considered in my medical decision making (see chart for details).  Review of the [place] CSRS was performed in accordance of the [place] prior to dispensing any controlled drugs.   Patient presented to the emergency department for evaluation of elbow injury.  Vital signs and exam are reassuring.  Elbow x-ray consistent with comminuted and displaced fracture of the olecranon and fracture of the proximal right radius.  Patient has an overlying skin avulsion.  Dr. Mack Guise was consulted and recommended that patient be transferred for further  evaluation between plastic surgery and orthopedics.  CT head, blood work, EKG was ordered since patient could not be sure that tripping caused her fall.  She did not hit her head or lose consciousness.  CT head negative for acute abnormalities.  EKG shows sinus rhythm with first-degree AV block.  She has CMP remarkable for WBC of 15.6.  CMP remarkable for slightly elevated BUN and creatinine from baseline.  Patient was given a gram of unicef. She is allergic to penicillins and clindamycin. Tetanus shot was updated. patient will be transferred to [facility] for further evaluation between plastics and orthopedics.  Report was given to Dr. Lula Olszewski.     ____________________________________________  FINAL CLINICAL IMPRESSION(S) / ED DIAGNOSES  Final diagnoses:  Type I or II open fracture of right olecranon, initial encounter  Type I or II open displaced fracture of neck of right radius, initial encounter  Avulsion of skin      NEW MEDICATIONS STARTED DURING THIS VISIT:  ED Discharge Orders    None          This chart was dictated using voice recognition software/Dragon. Despite best efforts to proofread, errors can occur which can change the meaning. Any change was purely unintentional.    Laban Emperor, PA-C 09/18/17 0044    Gregor Hams, MD 09/18/17 [phone]

## 2017-10-15 ENCOUNTER — Other Ambulatory Visit: Payer: Self-pay

## 2017-10-15 ENCOUNTER — Ambulatory Visit: Payer: Medicare Other | Attending: Orthopedic Surgery | Admitting: Occupational Therapy

## 2017-10-15 ENCOUNTER — Encounter: Payer: Self-pay | Admitting: Occupational Therapy

## 2017-10-15 DIAGNOSIS — L905 Scar conditions and fibrosis of skin: Secondary | ICD-10-CM | POA: Insufficient documentation

## 2017-10-15 DIAGNOSIS — M25521 Pain in right elbow: Secondary | ICD-10-CM | POA: Insufficient documentation

## 2017-10-15 DIAGNOSIS — M6281 Muscle weakness (generalized): Secondary | ICD-10-CM | POA: Insufficient documentation

## 2017-10-15 DIAGNOSIS — M25611 Stiffness of right shoulder, not elsewhere classified: Secondary | ICD-10-CM | POA: Diagnosis present

## 2017-10-15 DIAGNOSIS — M25621 Stiffness of right elbow, not elsewhere classified: Secondary | ICD-10-CM | POA: Insufficient documentation

## 2017-10-15 NOTE — Therapy (Signed)
[facility] [address], [place], [address] Phone: [phone]   Fax:  [phone]  Occupational Therapy Evaluation  Patient Details  Name: Jeanette Newton MRN: [id number] Date of Birth: Sep 05, 1927 Referring Provider: Tristan Schroeder   Encounter Date: 10/15/2017  OT End of Session - 10/15/17 1837    Visit Number  1    Number of Visits  16    Date for OT Re-Evaluation  12/10/17    OT Start Time  1423    OT Stop Time  1523    OT Time Calculation (min)  60 min    Activity Tolerance  Patient tolerated treatment well    Behavior During Therapy  [facility] for tasks assessed/performed       Past Medical History:  Diagnosis Date  . Cancer [facility]) 2005-2006   lymphoma  . Hypothyroidism   . Status post chemoradiation    lymphoma  . Status post radiation therapy   . Thyroid disease     Past Surgical History:  Procedure Laterality Date  . ABDOMINAL EXPLORATION SURGERY  2005-2006   Dr Pat Patrick  . CHOLECYSTECTOMY N/A 05/25/2016   Procedure: LAPAROSCOPIC CHOLECYSTECTOMY WITH INTRAOPERATIVE CHOLANGIOGRAM;  Surgeon: Robert Bellow, MD;  Location: [facility] ORS;  Service: General;  Laterality: N/A;    There were no vitals filed for this visit.  Subjective Assessment - 10/15/17 1656    Subjective   I fell carrying sausage down to basement - down 3 steps - had surgery 09/18/17 - my daugther changing bandages and keeping in sling going out of house - but not wearing splint since last visit to surgeon -getting my stitches out Thursday     Patient Stated Goals  I want to be able  to use my R hand and arm like before     Currently in Pain?  No/denies        [facility] OT Assessment - 10/15/17 0001      Assessment   Medical Diagnosis  L ORIF olecranon s/p fx     Referring Provider  Ostrum    Onset Date/Surgical Date  09/18/17    Hand Dominance  Left    Next MD Visit  -- 10/17/17      Precautions   Precaution Comments  NWB      Restrictions   Weight Bearing Restrictions  Yes      Balance Screen   Has the patient fallen in the past 6 months  Yes    How many times?  3    Has the patient had a decrease in activity level because of a fear of falling?   No    Is the patient reluctant to leave their home because of a fear of falling?   No      Home  Environment   Lives With  Spouse      Prior Function   Vocation  Retired    Leisure  Likes to [employer], own house work ,       AROM   Right Shoulder Extension  40 Degrees    Right Shoulder Flexion  120 Degrees    Right Shoulder ABduction  110 Degrees    Left Shoulder Extension  50 Degrees    Left Shoulder Flexion  140 Degrees    Left Shoulder ABduction  130 Degrees    Right Elbow Flexion  93    Right Elbow Extension  -23    Left Elbow Flexion  145  Left Elbow Extension  0    Right Forearm Pronation  90 Degrees    Right Forearm Supination  50 Degrees       assess ROM - and fitted with bandage around elbow for padding and tubigrip D for light compression on elbow and forearm - had pocket of edema on volar forearm - distal of where ace wrap was when come in    AAROM for shoulder flexion in supine - grasping upper arm  Elbow AAROM flexion - in neutral and pronation - against wall with upper arm against wall   10 reps   supination and pronation AROM   AROM for wrist in all planes               OT Education - 10/15/17 1836    Education provided  Yes    Education Details  findings of eval and HEP     Person(s) Educated  Patient;Child(ren)    Methods  Explanation;Demonstration;Tactile cues;Verbal cues;Handout    Comprehension  Returned demonstration;Verbalized understanding       OT Short Term Goals - 10/15/17 1843      OT SHORT TERM GOAL #1   Title  Pt to be ind in HEP to decrease scar tissue, increase ROM in L arm     Baseline  stitches still in and bandage - and scab - and decrease ROM in shoulder ,elbow and forearm     Time  3     Period  Weeks    Status  New    Target Date  11/05/17      OT SHORT TERM GOAL #2   Title  R forearm supination improve with more than 30 degrees to turn doorknob and reach for change/objects    Baseline  supination R 45 degrees    Time  3    Period  Weeks    Status  New    Target Date  11/05/17        OT Long Term Goals - 10/15/17 1849      OT LONG TERM GOAL #1   Title  R elbow AROM improve with more than 15 in extention and 30 in flexion to do hair , jewelry , and hand tuck shirt in     Baseline  Elbow flexion 93 degrees and extention -23 degrees     Time  5    Period  Weeks    Status  New    Target Date  11/19/17      OT LONG TERM GOAL #2   Title  Strength in R elbow and wrist increase for pt to carry 3-4 lbs without increase symptoms     Baseline  no strength- only doing AAROM - 4 wks s/p    Time  6    Period  Weeks    Status  New    Target Date  11/26/17      OT LONG TERM GOAL #3   Title  Pt will show increase for 20 points on FOTO score     Baseline  pt score at eval on FOTO 18    Time  6    Period  Weeks    Status  New    Target Date  11/26/17            Plan - 10/15/17 1838    Clinical Impression Statement  Pt present at evaluation 4 wks s/p ORIF R olecranon and tension band repair - skin flap - pt still  has stitches in and scab distal on flap - but healing very well - pt decline any numbness or sensation changes and pain only at night time - pt arrive with R arm in sling - report she is using it in ADL's - but not forcing it - pt to see surgeon in 2 days to get stitches out - pt show decrease elbow ROM and shoulder limiting her use of R arm in ADL's and IADL's - pt can benefit from OT services to decrease scar tissue, edema ,  increase ROM and strength per protocol     Occupational performance deficits (Please refer to evaluation for details):  ADL's;IADL's;Rest and Sleep;Leisure;Social Participation    Rehab Potential  Good    OT Frequency  2x / week     OT Duration  8 weeks    OT Treatment/Interventions  Self-care/ADL training;Moist Heat;Therapeutic exercise;Scar mobilization;Cryotherapy;Passive range of motion;Manual Therapy;Paraffin    Plan  assess progress with HEP -and results or new orders from surgeon     Clinical Decision Making  Multiple treatment options, significant modification of task necessary    OT Home Exercise Plan  see pt instruction    Consulted and Agree with Plan of Care  Patient;Family member/caregiver       Patient will benefit from skilled therapeutic intervention in order to improve the following deficits and impairments:  Decreased skin integrity, Increased edema, Impaired flexibility, Pain, Decreased scar mobility, Decreased strength, Impaired UE functional use, Decreased range of motion  Visit Diagnosis: Stiffness of right elbow, not elsewhere classified - Plan: Ot plan of care cert/re-cert  Stiffness of right shoulder, not elsewhere classified - Plan: Ot plan of care cert/re-cert  Scar condition and fibrosis of skin - Plan: Ot plan of care cert/re-cert  Muscle weakness (generalized) - Plan: Ot plan of care cert/re-cert  Pain in right elbow - Plan: Ot plan of care cert/re-cert    Problem List Patient Active Problem List   Diagnosis Date Noted  . Calculus of gallbladder with acute cholecystitis without obstruction 05/15/2016    Rosalyn Gess OTR/L,CLT 10/15/2017, 7:02 PM  [facility] [address], [place], [address] Phone: [phone]   Fax:  [phone]  Name: Jeanette Newton MRN: [id number] Date of Birth: September 26, 1927

## 2017-10-15 NOTE — Patient Instructions (Signed)
AAROM for shoulder flexion in supine - grasping upper arm  Elbow AAROM flexion - in neutral and pronation - against wall with upper arm against wall   10 reps   supination and pronation AROM   AROM for wrist in all planes

## 2017-10-21 ENCOUNTER — Ambulatory Visit: Payer: Medicare Other | Admitting: Occupational Therapy

## 2017-10-21 DIAGNOSIS — M25521 Pain in right elbow: Secondary | ICD-10-CM

## 2017-10-21 DIAGNOSIS — L905 Scar conditions and fibrosis of skin: Secondary | ICD-10-CM

## 2017-10-21 DIAGNOSIS — M25621 Stiffness of right elbow, not elsewhere classified: Secondary | ICD-10-CM

## 2017-10-21 DIAGNOSIS — M25611 Stiffness of right shoulder, not elsewhere classified: Secondary | ICD-10-CM

## 2017-10-21 DIAGNOSIS — M6281 Muscle weakness (generalized): Secondary | ICD-10-CM

## 2017-10-21 NOTE — Patient Instructions (Signed)
Add scar massage  Gentle PROM and AAROM for sup and elbow flexion- 3 positions  And extention  10 reps  3 x day  AROM for shoulder over head  And in supine - shoulder at 90 degrees kept - do elbow flexion and extention to top of head

## 2017-10-21 NOTE — Therapy (Signed)
[facility] [address] [place], [address] Phone: [phone]   Fax:  [phone]  Occupational Therapy Treatment  Patient Details  Name: Jeanette Newton MRN: [id number] Date of Birth: Jan 03, 1928 Referring Provider: Tristan Schroeder   Encounter Date: 10/21/2017  OT End of Session - 10/21/17 1503    Visit Number  2    Number of Visits  16    Date for OT Re-Evaluation  12/10/17    OT Start Time  1255    OT Stop Time  1332    OT Time Calculation (min)  37 min    Activity Tolerance  Patient tolerated treatment well    Behavior During Therapy  [facility] for tasks assessed/performed       Past Medical History:  Diagnosis Date  . Cancer [facility]) 2005-2006   lymphoma  . Hypothyroidism   . Status post chemoradiation    lymphoma  . Status post radiation therapy   . Thyroid disease     Past Surgical History:  Procedure Laterality Date  . ABDOMINAL EXPLORATION SURGERY  2005-2006   Dr Pat Patrick  . CHOLECYSTECTOMY N/A 05/25/2016   Procedure: LAPAROSCOPIC CHOLECYSTECTOMY WITH INTRAOPERATIVE CHOLANGIOGRAM;  Surgeon: Robert Bellow, MD;  Location: [facility] ORS;  Service: General;  Laterality: N/A;    There were no vitals filed for this visit.  Subjective Assessment - 10/21/17 1456    Subjective   I tried to talk on phone the other day and my nose was running and could not get my R hand to my nose - did see surgeon - he said for me to shower my elbow and get scab off -  and send you this paper     Patient Stated Goals  I want to be able  to use my R hand and arm like before     Currently in Pain?  No/denies         [facility] OT Assessment - 10/21/17 0001      AROM   Left Shoulder ABduction  140 Degrees    Right Elbow Flexion  110    Right Elbow Extension  -15      Assess scar - and pt and daughter ed on scar massage - all around flap where stitches were but stay around area with scabs still - but cont to get wet in shower and  wash it   5 min into heat  Gentle PROM for elbow flexion - 3 positions - 10 reps  Each a And then AAROM elbow flexion 3 positions  and into extention every time   daughter ed on doing it -and deme and she done them in clinic - with hand over hand by OT    Sup PROM - and during elbow flexion -some pain but less than 3/10 To cont at home with  Pt to cont with shoulder flexion in supine  And done and can do at home elbow flexion and extention in supine with shoulder flex at 90 degrees - touch top of head and up to ceiling - 10 reps  functional reaching to nose - able to do end of session and slide down hand back of head  and can interlock fingers and slide down for stretch for elbow flexion  10 reps  keep pain under 2/10          OT Treatments/Exercises (OP) - 10/21/17 0001      Moist Heat Therapy   Number Minutes Moist Heat  10 Minutes    Moist Heat Location  Elbow Right prior and during PROM for elbow flexion              OT Education - 10/21/17 1501    Education provided  Yes    Education Details  Scar massage and PROM /AAROM  to elbow     Person(s) Educated  Patient;Child(ren)    Methods  Explanation    Comprehension  Verbalized understanding;Returned demonstration;Verbal cues required       OT Short Term Goals - 10/15/17 1843      OT SHORT TERM GOAL #1   Title  Pt to be ind in HEP to decrease scar tissue, increase ROM in L arm     Baseline  stitches still in and bandage - and scab - and decrease ROM in shoulder ,elbow and forearm     Time  3    Period  Weeks    Status  New    Target Date  11/05/17      OT SHORT TERM GOAL #2   Title  R forearm supination improve with more than 30 degrees to turn doorknob and reach for change/objects    Baseline  supination R 45 degrees    Time  3    Period  Weeks    Status  New    Target Date  11/05/17        OT Long Term Goals - 10/15/17 1849      OT LONG TERM GOAL #1   Title  R elbow AROM improve with more  than 15 in extention and 30 in flexion to do hair , jewelry , and hand tuck shirt in     Baseline  Elbow flexion 93 degrees and extention -23 degrees     Time  5    Period  Weeks    Status  New    Target Date  11/19/17      OT LONG TERM GOAL #2   Title  Strength in R elbow and wrist increase for pt to carry 3-4 lbs without increase symptoms     Baseline  no strength- only doing AAROM - 4 wks s/p    Time  6    Period  Weeks    Status  New    Target Date  11/26/17      OT LONG TERM GOAL #3   Title  Pt will show increase for 20 points on FOTO score     Baseline  pt score at eval on FOTO 18    Time  6    Period  Weeks    Status  New    Target Date  11/26/17            Plan - 10/21/17 1504    Clinical Impression Statement  Pt showed great progress in AROM for elbow , and shoudler since last week - new order from surgeon for AAROM flexion of elbow , shoulder and elbow ROM and strength for R UE  - pt scab come mostly off - still 2 nail size areas - pt and daughter ed on scar massage and  ROM  new HEP -great progress at end of session     Occupational performance deficits (Please refer to evaluation for details):  ADL's;IADL's;Rest and Sleep;Leisure;Social Participation    Rehab Potential  Good    OT Frequency  2x / week    OT Duration  8 weeks    OT Treatment/Interventions  Self-care/ADL  training;Moist Heat;Therapeutic exercise;Scar mobilization;Cryotherapy;Passive range of motion;Manual Therapy;Paraffin    Plan  assess progress with HEP -and results or new orders from surgeon     Clinical Decision Making  Multiple treatment options, significant modification of task necessary    OT Home Exercise Plan  see pt instruction    Consulted and Agree with Plan of Care  Patient;Family member/caregiver       Patient will benefit from skilled therapeutic intervention in order to improve the following deficits and impairments:  Decreased skin integrity, Increased edema, Impaired flexibility,  Pain, Decreased scar mobility, Decreased strength, Impaired UE functional use, Decreased range of motion  Visit Diagnosis: Stiffness of right elbow, not elsewhere classified  Stiffness of right shoulder, not elsewhere classified  Scar condition and fibrosis of skin  Muscle weakness (generalized)  Pain in right elbow    Problem List Patient Active Problem List   Diagnosis Date Noted  . Calculus of gallbladder with acute cholecystitis without obstruction 05/15/2016    Rosalyn Gess OTR/L,CLT 10/21/2017, 4:36 PM  [place] [facility] [address] [place], [address] Phone: [phone]   Fax:  [phone]  Name: Jeanette Newton MRN: [id number] Date of Birth: [birth year]

## 2017-10-23 ENCOUNTER — Ambulatory Visit: Payer: Medicare Other | Admitting: Occupational Therapy

## 2017-10-23 DIAGNOSIS — M25521 Pain in right elbow: Secondary | ICD-10-CM

## 2017-10-23 DIAGNOSIS — M25611 Stiffness of right shoulder, not elsewhere classified: Secondary | ICD-10-CM

## 2017-10-23 DIAGNOSIS — L905 Scar conditions and fibrosis of skin: Secondary | ICD-10-CM

## 2017-10-23 DIAGNOSIS — M6281 Muscle weakness (generalized): Secondary | ICD-10-CM

## 2017-10-23 DIAGNOSIS — M25621 Stiffness of right elbow, not elsewhere classified: Secondary | ICD-10-CM | POA: Diagnosis not present

## 2017-10-23 NOTE — Patient Instructions (Signed)
Cont same HEP as last time - daugher not present this date  reinforce to focus on supination and palm up during elbow flexion and extention  And also end range elbow extention

## 2017-10-23 NOTE — Therapy (Signed)
[facility] [address], [place], [address] Phone: [phone]   Fax:  [phone]  Occupational Therapy Treatment  Patient Details  Name: Jeanette Newton MRN: [id number] Date of Birth: May 18, 1928 Referring Provider: Tristan Schroeder   Encounter Date: 10/23/2017  OT End of Session - 10/23/17 1439    Visit Number  3    Number of Visits  16    Date for OT Re-Evaluation  12/10/17    OT Start Time  0917    OT Stop Time  0958    OT Time Calculation (min)  41 min    Activity Tolerance  Patient tolerated treatment well    Behavior During Therapy  [facility] for tasks assessed/performed       Past Medical History:  Diagnosis Date  . Cancer [facility]) 2005-2006   lymphoma  . Hypothyroidism   . Status post chemoradiation    lymphoma  . Status post radiation therapy   . Thyroid disease     Past Surgical History:  Procedure Laterality Date  . ABDOMINAL EXPLORATION SURGERY  2005-2006   Dr Pat Patrick  . CHOLECYSTECTOMY N/A 05/25/2016   Procedure: LAPAROSCOPIC CHOLECYSTECTOMY WITH INTRAOPERATIVE CHOLANGIOGRAM;  Surgeon: Robert Bellow, MD;  Location: [facility] ORS;  Service: General;  Laterality: N/A;    There were no vitals filed for this visit.  Subjective Assessment - 10/23/17 1437    Subjective   Doing okay - I just hope I can get the use of my R arm back again - how is the scar doing     Patient Stated Goals  I want to be able  to use my R hand and arm like before     Currently in Pain?  No/denies       End of session taken   [facility] OT Assessment - 10/23/17 0001      AROM   Right Shoulder Flexion  140 Degrees    Right Elbow Flexion  122 end of session     Right Elbow Extension  -16               OT Treatments/Exercises (OP) - 10/23/17 0001      Moist Heat Therapy   Number Minutes Moist Heat  10 Minutes    Moist Heat Location  Elbow prior to ROM and manual       Assess scar -  scar massage done still 3 small  scabs still present -  Scar massage done all around flap where stitches were but stay off area with scabs still - but cont to get wet in shower and wash it   After heat  Gentle PROM for elbow flexion - 3 positions - 10 reps  Reinforce elbow extention inbetween and supination position - do have some pain but did not provide nr on pain scale  And then AAROM elbow flexion 3 positions  and into extention every time   R shoulder flexion in supine  Done with reach over head and back to mat table  10 reps   And done elbow flexion and extention in supine with shoulder flex at 90 degrees - touch top of head and up to ceiling - 10 reps  functional reaching to nose - able to do end of session and slide down hand back of head  and can interlock fingers and slide down for stretch for elbow flexion   10 reps done   keep pain under 2/10   Measurements taken  end of session        OT Education - 10/23/17 1439    Education provided  Yes    Education Details  ROM HEP     Person(s) Educated  Patient    Methods  Explanation;Tactile cues    Comprehension  Returned demonstration;Verbalized understanding       OT Short Term Goals - 10/15/17 1843      OT SHORT TERM GOAL #1   Title  Pt to be ind in HEP to decrease scar tissue, increase ROM in L arm     Baseline  stitches still in and bandage - and scab - and decrease ROM in shoulder ,elbow and forearm     Time  3    Period  Weeks    Status  New    Target Date  11/05/17      OT SHORT TERM GOAL #2   Title  R forearm supination improve with more than 30 degrees to turn doorknob and reach for change/objects    Baseline  supination R 45 degrees    Time  3    Period  Weeks    Status  New    Target Date  11/05/17        OT Long Term Goals - 10/15/17 1849      OT LONG TERM GOAL #1   Title  R elbow AROM improve with more than 15 in extention and 30 in flexion to do hair , jewelry , and hand tuck shirt in     Baseline  Elbow flexion 93 degrees  and extention -23 degrees     Time  5    Period  Weeks    Status  New    Target Date  11/19/17      OT LONG TERM GOAL #2   Title  Strength in R elbow and wrist increase for pt to carry 3-4 lbs without increase symptoms     Baseline  no strength- only doing AAROM - 4 wks s/p    Time  6    Period  Weeks    Status  New    Target Date  11/26/17      OT LONG TERM GOAL #3   Title  Pt will show increase for 20 points on FOTO score     Baseline  pt score at eval on FOTO 18    Time  6    Period  Weeks    Status  New    Target Date  11/26/17            Plan - 10/23/17 1439    Clinical Impression Statement  Pt improving in AROM for elbow flexion , extention and scar - pt daughter not present this date so did not add any new HEP - to cont with same but reinforce to work inbetween flexion to end range extention and also supination  during one of the 3 elbow flexion     Occupational performance deficits (Please refer to evaluation for details):  ADL's;IADL's;Rest and Sleep;Leisure;Social Participation    Rehab Potential  Good    OT Frequency  2x / week    OT Duration  8 weeks    OT Treatment/Interventions  Self-care/ADL training;Moist Heat;Therapeutic exercise;Scar mobilization;Cryotherapy;Passive range of motion;Manual Therapy;Paraffin    Plan  assess progress and upgrade HEP as needed     Clinical Decision Making  Multiple treatment options, significant modification of task necessary    OT Home Exercise Plan  see pt instruction    Consulted and Agree with Plan of Care  Patient       Patient will benefit from skilled therapeutic intervention in order to improve the following deficits and impairments:  Decreased skin integrity, Increased edema, Impaired flexibility, Pain, Decreased scar mobility, Decreased strength, Impaired UE functional use, Decreased range of motion  Visit Diagnosis: Stiffness of right elbow, not elsewhere classified  Stiffness of right shoulder, not elsewhere  classified  Scar condition and fibrosis of skin  Muscle weakness (generalized)  Pain in right elbow    Problem List Patient Active Problem List   Diagnosis Date Noted  . Calculus of gallbladder with acute cholecystitis without obstruction 05/15/2016    Rosalyn Gess OTR/L,CLT 10/23/2017, 2:41 PM  [facility] [address] [place], [address] Phone: [phone]   Fax:  [phone]  Name: AMAR KEENUM MRN: [id number] Date of Birth: [birth year]

## 2017-10-24 ENCOUNTER — Encounter: Payer: Medicare Other | Admitting: Occupational Therapy

## 2017-10-28 ENCOUNTER — Ambulatory Visit: Payer: Medicare Other | Attending: Orthopedic Surgery | Admitting: Occupational Therapy

## 2017-10-28 DIAGNOSIS — M25521 Pain in right elbow: Secondary | ICD-10-CM | POA: Insufficient documentation

## 2017-10-28 DIAGNOSIS — L905 Scar conditions and fibrosis of skin: Secondary | ICD-10-CM | POA: Insufficient documentation

## 2017-10-28 DIAGNOSIS — M25611 Stiffness of right shoulder, not elsewhere classified: Secondary | ICD-10-CM | POA: Insufficient documentation

## 2017-10-28 DIAGNOSIS — M6281 Muscle weakness (generalized): Secondary | ICD-10-CM | POA: Diagnosis present

## 2017-10-28 DIAGNOSIS — M25621 Stiffness of right elbow, not elsewhere classified: Secondary | ICD-10-CM | POA: Diagnosis not present

## 2017-10-28 NOTE — Patient Instructions (Signed)
COnt with same HEP

## 2017-10-28 NOTE — Therapy (Signed)
[place] [address] [place], [address] Phone: [phone]   Fax:  [phone]  Occupational Therapy Treatment  Patient Details  Name: Jeanette Newton MRN: [id number] Date of Birth: 04-Feb-1928 Referring Provider: Tristan Schroeder   Encounter Date: 10/28/2017  OT End of Session - 10/28/17 1104    Visit Number  4    Number of Visits  16    Date for OT Re-Evaluation  12/10/17    OT Start Time  1010    OT Stop Time  1053    OT Time Calculation (min)  43 min    Activity Tolerance  Patient tolerated treatment well    Behavior During Therapy  [facility] for tasks assessed/performed       Past Medical History:  Diagnosis Date  . Cancer [facility]) 2005-2006   lymphoma  . Hypothyroidism   . Status post chemoradiation    lymphoma  . Status post radiation therapy   . Thyroid disease     Past Surgical History:  Procedure Laterality Date  . ABDOMINAL EXPLORATION SURGERY  2005-2006   Dr Pat Patrick  . CHOLECYSTECTOMY N/A 05/25/2016   Procedure: LAPAROSCOPIC CHOLECYSTECTOMY WITH INTRAOPERATIVE CHOLANGIOGRAM;  Surgeon: Robert Bellow, MD;  Location: [facility] ORS;  Service: General;  Laterality: N/A;    There were no vitals filed for this visit.  Subjective Assessment - 10/28/17 1028    Subjective   My elbow is getting better - using it more - opening car door, doing seatbelt - not painfull only sore     Patient Stated Goals  I want to be able  to use my R hand and arm like before     Currently in Pain?  No/denies         [facility] OT Assessment - 10/28/17 0001      AROM   Right Elbow Flexion  124 133 end of session    Right Elbow Extension  -14 -9 end of session    Right Forearm Supination  85 Degrees               OT Treatments/Exercises (OP) - 10/28/17 0001      Moist Heat Therapy   Number Minutes Moist Heat  10 Minutes    Moist Heat Location  Elbow 5 min in extention , 5 min in flexion        Assess scar -  scar  massage done - down to 2 little and one eraser size scab   -  Scar massage and mobs done  all around flap where stitches were and all over flap - where scar is adhere After heat   Gentle PROM/AAROM  for elbow flexion - 3 positions - 10 reps  Reinforce elbow extention inbetween and supination position - do have some pain end range but more sorenss  R shoulder flexion in supine  Done with reach over head  And extending elbow and back to mat table in elbow extention   10 reps   Supination AAROM and AROM done 8 reps   And done elbow flexion and extention in supine with shoulder flex at 90 degrees - touch top of head and up to ceiling - 10 reps functional reaching behind head and then lower back - alternate  10 reps   and can interlock fingers and slide down for stretch for elbow flexion   10 reps done  keep pain under 2/10   Measurements taken end of session  -  great progress cont         OT Education - 10/28/17 1104    Education provided  Yes    Education Details  ROM HEP - progress     Person(s) Educated  Patient;Child(ren)    Methods  Explanation;Demonstration;Tactile cues    Comprehension  Verbalized understanding;Returned demonstration       OT Short Term Goals - 10/15/17 1843      OT SHORT TERM GOAL #1   Title  Pt to be ind in HEP to decrease scar tissue, increase ROM in L arm     Baseline  stitches still in and bandage - and scab - and decrease ROM in shoulder ,elbow and forearm     Time  3    Period  Weeks    Status  New    Target Date  11/05/17      OT SHORT TERM GOAL #2   Title  R forearm supination improve with more than 30 degrees to turn doorknob and reach for change/objects    Baseline  supination R 45 degrees    Time  3    Period  Weeks    Status  New    Target Date  11/05/17        OT Long Term Goals - 10/15/17 1849      OT LONG TERM GOAL #1   Title  R elbow AROM improve with more than 15 in extention and 30 in flexion to do hair , jewelry ,  and hand tuck shirt in     Baseline  Elbow flexion 93 degrees and extention -23 degrees     Time  5    Period  Weeks    Status  New    Target Date  11/19/17      OT LONG TERM GOAL #2   Title  Strength in R elbow and wrist increase for pt to carry 3-4 lbs without increase symptoms     Baseline  no strength- only doing AAROM - 4 wks s/p    Time  6    Period  Weeks    Status  New    Target Date  11/26/17      OT LONG TERM GOAL #3   Title  Pt will show increase for 20 points on FOTO score     Baseline  pt score at eval on FOTO 18    Time  6    Period  Weeks    Status  New    Target Date  11/26/17            Plan - 10/28/17 1105    Clinical Impression Statement  Pt cont to make excellent progress in ROM and PROM of R elbow with AAROM and AROM - increase use reported - and increase functional AROM - cont to work on flexion and extention of R ebow     Occupational performance deficits (Please refer to evaluation for details):  ADL's;IADL's;Rest and Sleep;Leisure;Social Participation    Rehab Potential  Good    OT Frequency  2x / week    OT Duration  6 weeks    OT Treatment/Interventions  Self-care/ADL training;Moist Heat;Therapeutic exercise;Scar mobilization;Cryotherapy;Passive range of motion;Manual Therapy;Paraffin    Plan  cont to increase ROM - upgrade HEP as needed     Clinical Decision Making  Multiple treatment options, significant modification of task necessary    OT Home Exercise Plan  see pt instruction    Consulted and Agree  with Plan of Care  Patient       Patient will benefit from skilled therapeutic intervention in order to improve the following deficits and impairments:  Decreased skin integrity, Increased edema, Impaired flexibility, Pain, Decreased scar mobility, Decreased strength, Impaired UE functional use, Decreased range of motion  Visit Diagnosis: Stiffness of right elbow, not elsewhere classified  Stiffness of right shoulder, not elsewhere  classified  Muscle weakness (generalized)  Pain in right elbow  Scar condition and fibrosis of skin    Problem List Patient Active Problem List   Diagnosis Date Noted  . Calculus of gallbladder with acute cholecystitis without obstruction 05/15/2016    Rosalyn Gess OTR/L,CLT  10/28/2017, 11:08 AM  [place] [facility] [address] [place], [address] Phone: [phone]   Fax:  [phone]  Name: Jeanette Newton MRN: [id number] Date of Birth: 06/28/1928

## 2017-10-30 ENCOUNTER — Ambulatory Visit: Payer: Medicare Other | Admitting: Occupational Therapy

## 2017-10-30 DIAGNOSIS — M25621 Stiffness of right elbow, not elsewhere classified: Secondary | ICD-10-CM

## 2017-10-30 DIAGNOSIS — M25521 Pain in right elbow: Secondary | ICD-10-CM

## 2017-10-30 DIAGNOSIS — L905 Scar conditions and fibrosis of skin: Secondary | ICD-10-CM

## 2017-10-30 DIAGNOSIS — M6281 Muscle weakness (generalized): Secondary | ICD-10-CM

## 2017-10-30 DIAGNOSIS — M25611 Stiffness of right shoulder, not elsewhere classified: Secondary | ICD-10-CM

## 2017-10-30 NOTE — Therapy (Signed)
[place] [facility] [address] [place], [address] Phone: [phone]   Fax:  [phone]  Occupational Therapy Treatment  Patient Details  Name: Jeanette Newton MRN: [id number] Date of Birth: 29-May-1928 Referring Provider: Tristan Schroeder   Encounter Date: 10/30/2017  OT End of Session - 10/30/17 1539    Visit Number  5    Number of Visits  16    Date for OT Re-Evaluation  12/10/17    OT Start Time  1304    OT Stop Time  1345    OT Time Calculation (min)  41 min    Activity Tolerance  Patient tolerated treatment well    Behavior During Therapy  [facility] for tasks assessed/performed       Past Medical History:  Diagnosis Date  . Cancer [facility]) 2005-2006   lymphoma  . Hypothyroidism   . Status post chemoradiation    lymphoma  . Status post radiation therapy   . Thyroid disease     Past Surgical History:  Procedure Laterality Date  . ABDOMINAL EXPLORATION SURGERY  2005-2006   Dr Pat Patrick  . CHOLECYSTECTOMY N/A 05/25/2016   Procedure: LAPAROSCOPIC CHOLECYSTECTOMY WITH INTRAOPERATIVE CHOLANGIOGRAM;  Surgeon: Robert Bellow, MD;  Location: [facility] ORS;  Service: General;  Laterality: N/A;    There were no vitals filed for this visit.  Subjective Assessment - 10/30/17 1537    Subjective   I am using it more - I did drive yesterday to lunch with my friends - using it to turn doorknob , start car - do seatbelt    Patient Stated Goals  I want to be able  to use my R hand and arm like before     Currently in Pain?  No/denies         [facility] OT Assessment - 10/30/17 0001      AROM   Right Elbow Flexion  132    Right Elbow Extension  -12    Right Forearm Supination  85 Degrees               OT Treatments/Exercises (OP) - 10/30/17 0001      Moist Heat Therapy   Number Minutes Moist Heat  10 Minutes    Moist Heat Location  Elbow at [facility] for ext and then 5 min flexion stretch         Assess scar -scar massage  done -  All over the flap area where it is adhere - pt ed on scar massage and where it is adhere in mirror and daughter ed on to do also at times areas that pt cannot reach      Gentle PROM/AAROM  for elbow flexion - 3 positions - 10 reps Reinforce elbow extention inbetween and supination position - do have some pain end range but more sornenss  Rshoulder flexion in supineDone with reach over head  And extending elbow and back to mat table in elbow extention  10 reps  As well as reaching functionally from L shoulder and horizontal ADD - 10 reps  And D1 and D2 patterns   10 reps  pt report arm got tired and had hard time towards last reps  Add to HEP   Supination AAROM and AROM done 8 reps   Afunctional reaching behind head and then lower back - alternate  10 reps   and can interlock fingers and slide down for stretch for elbow flexion 10 reps done keep pain under  2/10 Progress to reach to hairline in back end of session and easy to R ear         OT Education - 10/30/17 1539    Education provided  Yes    Education Details  functional AROM and reaching for endurance add    Person(s) Educated  Patient;Child(ren)    Methods  Explanation;Demonstration;Tactile cues;Verbal cues;Handout    Comprehension  Verbalized understanding;Returned demonstration       OT Short Term Goals - 10/15/17 1843      OT SHORT TERM GOAL #1   Title  Pt to be ind in HEP to decrease scar tissue, increase ROM in L arm     Baseline  stitches still in and bandage - and scab - and decrease ROM in shoulder ,elbow and forearm     Time  3    Period  Weeks    Status  New    Target Date  11/05/17      OT SHORT TERM GOAL #2   Title  R forearm supination improve with more than 30 degrees to turn doorknob and reach for change/objects    Baseline  supination R 45 degrees    Time  3    Period  Weeks    Status  New    Target Date  11/05/17        OT Long Term Goals - 10/15/17 1849       OT LONG TERM GOAL #1   Title  R elbow AROM improve with more than 15 in extention and 30 in flexion to do hair , jewelry , and hand tuck shirt in     Baseline  Elbow flexion 93 degrees and extention -23 degrees     Time  5    Period  Weeks    Status  New    Target Date  11/19/17      OT LONG TERM GOAL #2   Title  Strength in R elbow and wrist increase for pt to carry 3-4 lbs without increase symptoms     Baseline  no strength- only doing AAROM - 4 wks s/p    Time  6    Period  Weeks    Status  New    Target Date  11/26/17      OT LONG TERM GOAL #3   Title  Pt will show increase for 20 points on FOTO score     Baseline  pt score at eval on FOTO 18    Time  6    Period  Weeks    Status  New    Target Date  11/26/17            Plan - 10/30/17 1540    Clinical Impression Statement  Pt cont to made great progress in AROM for R elbow flexion and extention - decrease pain -and scar improving -still adhere in middle and at scab -  pt to work on functional reaching to increase endurance  in R UE      Occupational performance deficits (Please refer to evaluation for details):  ADL's;IADL's;Rest and Sleep;Leisure;Social Participation    Rehab Potential  Good    OT Frequency  2x / week    OT Duration  4 weeks    OT Treatment/Interventions  Self-care/ADL training;Moist Heat;Therapeutic exercise;Scar mobilization;Cryotherapy;Passive range of motion;Manual Therapy;Paraffin    Plan  cont to upgrade HEP     Clinical Decision Making  Multiple treatment options, significant modification of task necessary  OT Home Exercise Plan  see pt instruction    Consulted and Agree with Plan of Care  Patient       Patient will benefit from skilled therapeutic intervention in order to improve the following deficits and impairments:  Decreased skin integrity, Increased edema, Impaired flexibility, Pain, Decreased scar mobility, Decreased strength, Impaired UE functional use, Decreased range of  motion  Visit Diagnosis: Stiffness of right elbow, not elsewhere classified  Stiffness of right shoulder, not elsewhere classified  Muscle weakness (generalized)  Pain in right elbow  Scar condition and fibrosis of skin    Problem List Patient Active Problem List   Diagnosis Date Noted  . Calculus of gallbladder with acute cholecystitis without obstruction 05/15/2016    Rosalyn Gess OTR/L,CLT 10/30/2017, 3:42 PM  [place] [facility] [address], [place], [address] Phone: [phone]   Fax:  [phone]  Name: YITTEL EMRICH MRN: [id number] Date of Birth: 02-13-1928

## 2017-10-30 NOTE — Patient Instructions (Signed)
Add scar mobs over flap  And functional AROM and reaching across body and D1 and D2 patterns  And over head  10 reps   2 x day

## 2017-11-05 ENCOUNTER — Ambulatory Visit: Payer: Medicare Other | Admitting: Occupational Therapy

## 2017-11-05 DIAGNOSIS — M25621 Stiffness of right elbow, not elsewhere classified: Secondary | ICD-10-CM

## 2017-11-05 DIAGNOSIS — L905 Scar conditions and fibrosis of skin: Secondary | ICD-10-CM

## 2017-11-05 DIAGNOSIS — M25611 Stiffness of right shoulder, not elsewhere classified: Secondary | ICD-10-CM

## 2017-11-05 DIAGNOSIS — M25521 Pain in right elbow: Secondary | ICD-10-CM

## 2017-11-05 DIAGNOSIS — M6281 Muscle weakness (generalized): Secondary | ICD-10-CM

## 2017-11-05 NOTE — Therapy (Signed)
[place] [facility] [address] [place], [address] Phone: [phone]   Fax:  [phone]  Occupational Therapy Treatment  Patient Details  Name: Jeanette Newton MRN: [id number] Date of Birth: Nov 12, 1927 Referring Provider: Tristan Schroeder   Encounter Date: 11/05/2017  OT End of Session - 11/05/17 1636    Visit Number  6    Number of Visits  16    Date for OT Re-Evaluation  12/10/17    OT Start Time  1100    OT Stop Time  1148    OT Time Calculation (min)  48 min    Activity Tolerance  Patient tolerated treatment well    Behavior During Therapy  [facility] for tasks assessed/performed       Past Medical History:  Diagnosis Date  . Cancer [facility]) 2005-2006   lymphoma  . Hypothyroidism   . Status post chemoradiation    lymphoma  . Status post radiation therapy   . Thyroid disease     Past Surgical History:  Procedure Laterality Date  . ABDOMINAL EXPLORATION SURGERY  2005-2006   Dr Pat Patrick  . CHOLECYSTECTOMY N/A 05/25/2016   Procedure: LAPAROSCOPIC CHOLECYSTECTOMY WITH INTRAOPERATIVE CHOLANGIOGRAM;  Surgeon: Robert Bellow, MD;  Location: [facility] ORS;  Service: General;  Laterality: N/A;    There were no vitals filed for this visit.  Subjective Assessment - 11/05/17 1633    Subjective   I can tell I am getting better - using it more - and what do you think of the scab and scar -  I am massaging it     Patient Stated Goals  I want to be able  to use my R hand and arm like before     Currently in Pain?  No/denies         [facility] OT Assessment - 11/05/17 0001      AROM   Right Elbow Flexion  140    Right Elbow Extension  -12    Right Forearm Supination  90 Degrees               OT Treatments/Exercises (OP) - 11/05/17 0001      Moist Heat Therapy   Number Minutes Moist Heat  10 Minutes    Moist Heat Location  -- elbow extention for last 12 degrees stretch        Assess scar -scar massage done-  All  over the flap area where it is adhere - pt ed on scar massage and where it is adhere and to go all directions     PROM/AAROMfor elbow flexion - 3 positions - 10 reps Reinforce elbow extention inbetween and supination position - do have some pain after doing end range for one and then change directions   Add 1 lbs weight for elbow flexion and extention in standing against wall  10 reps  And  reach over headAnd extending elbowand back to mat tablein elbow extention10 reps with 1lbs 10 reps  As well as reaching functionally from L shoulder and horizontal ADD - 10 reps  Attempted  D1 and D2 patterns but pt fatigue and could not do motion controlled     pt report arm got tired and had hard time towards last reps  Add to HEP   also did 1 lbs in standing for sup /pro - 10 reps   and can interlock fingers and slide down for stretch for elbow flexion 10 reps  keep pain under 2/10  OT Education - 11/05/17 1636    Education provided  Yes    Education Details  1 lbs weight add for elbow and sup/pro , over head -     Person(s) Educated  Patient;Child(ren)    Methods  Explanation;Demonstration;Tactile cues;Verbal cues;Handout    Comprehension  Verbalized understanding;Returned demonstration       OT Short Term Goals - 10/15/17 1843      OT SHORT TERM GOAL #1   Title  Pt to be ind in HEP to decrease scar tissue, increase ROM in L arm     Baseline  stitches still in and bandage - and scab - and decrease ROM in shoulder ,elbow and forearm     Time  3    Period  Weeks    Status  New    Target Date  11/05/17      OT SHORT TERM GOAL #2   Title  R forearm supination improve with more than 30 degrees to turn doorknob and reach for change/objects    Baseline  supination R 45 degrees    Time  3    Period  Weeks    Status  New    Target Date  11/05/17        OT Long Term Goals - 10/15/17 1849      OT LONG TERM GOAL #1   Title  R elbow AROM improve with  more than 15 in extention and 30 in flexion to do hair , jewelry , and hand tuck shirt in     Baseline  Elbow flexion 93 degrees and extention -23 degrees     Time  5    Period  Weeks    Status  New    Target Date  11/19/17      OT LONG TERM GOAL #2   Title  Strength in R elbow and wrist increase for pt to carry 3-4 lbs without increase symptoms     Baseline  no strength- only doing AAROM - 4 wks s/p    Time  6    Period  Weeks    Status  New    Target Date  11/26/17      OT LONG TERM GOAL #3   Title  Pt will show increase for 20 points on FOTO score     Baseline  pt score at eval on FOTO 18    Time  6    Period  Weeks    Status  New    Target Date  11/26/17            Plan - 11/05/17 1637    Clinical Impression Statement  Pt cont to show increase AROM in elbow - flexion great progress since last time- extention this date -12 and flexion 140 this date  - supination WNL - initiated 1 lbs for elbow and shoulder - pt's R arm fatigue quick - pt to do slow and controlled and if needed break up reps      Occupational performance deficits (Please refer to evaluation for details):  ADL's;IADL's;Rest and Sleep;Leisure;Social Participation    Rehab Potential  Good    OT Frequency  2x / week    OT Duration  4 weeks    OT Treatment/Interventions  Self-care/ADL training;Moist Heat;Therapeutic exercise;Scar mobilization;Cryotherapy;Passive range of motion;Manual Therapy;Paraffin    Plan  upgrade reps and sets and check how doing with 1 lbs weight     Clinical Decision Making  Multiple treatment options, significant modification  of task necessary    OT Home Exercise Plan  see pt instruction    Consulted and Agree with Plan of Care  Patient       Patient will benefit from skilled therapeutic intervention in order to improve the following deficits and impairments:  Decreased skin integrity, Increased edema, Impaired flexibility, Pain, Decreased scar mobility, Decreased strength, Impaired  UE functional use, Decreased range of motion  Visit Diagnosis: Stiffness of right elbow, not elsewhere classified  Stiffness of right shoulder, not elsewhere classified  Muscle weakness (generalized)  Pain in right elbow  Scar condition and fibrosis of skin    Problem List Patient Active Problem List   Diagnosis Date Noted  . Calculus of gallbladder with acute cholecystitis without obstruction 05/15/2016    Rosalyn Gess OTR/L,CLT 11/05/2017, 4:45 PM  [place] [facility] [address] [place], [address] Phone: [phone]   Fax:  [phone]  Name: LAIAH POUNCEY MRN: [id number] Date of Birth: [birth year]

## 2017-11-05 NOTE — Patient Instructions (Signed)
Same PROM and heat prior  Scar massage   add 1 lbs weight for elbow flexion , extention standing - 3 positions 5 reps each  horizontal ABD and ADD to opposite shoulder 10 reps - supine  And over head in supine and down to bed for extention  10 reps   SLOW and CONTROLLED   And sup/pro 1 lbs weight  2 x day

## 2017-11-08 ENCOUNTER — Ambulatory Visit: Payer: Medicare Other | Admitting: Occupational Therapy

## 2017-11-08 DIAGNOSIS — M25521 Pain in right elbow: Secondary | ICD-10-CM

## 2017-11-08 DIAGNOSIS — L905 Scar conditions and fibrosis of skin: Secondary | ICD-10-CM

## 2017-11-08 DIAGNOSIS — M25621 Stiffness of right elbow, not elsewhere classified: Secondary | ICD-10-CM | POA: Diagnosis not present

## 2017-11-08 DIAGNOSIS — M25611 Stiffness of right shoulder, not elsewhere classified: Secondary | ICD-10-CM

## 2017-11-08 DIAGNOSIS — M6281 Muscle weakness (generalized): Secondary | ICD-10-CM

## 2017-11-08 NOTE — Therapy (Signed)
[place] [facility] [address], [place], [address] Phone: [phone]   Fax:  [phone]  Occupational Therapy Treatment  Patient Details  Name: [name] MRN: [id number] Date of Birth: May 15, 1928 Referring Provider: Tristan Schroeder   Encounter Date: 11/08/2017  OT End of Session - 11/08/17 1126    Visit Number  6    Number of Visits  16    Date for OT Re-Evaluation  12/10/17    OT Start Time  1030    OT Stop Time  1113    OT Time Calculation (min)  43 min    Activity Tolerance  Patient tolerated treatment well    Behavior During Therapy  [facility] for tasks assessed/performed       Past Medical History:  Diagnosis Date  . Cancer [facility]) 2005-2006   lymphoma  . Hypothyroidism   . Status post chemoradiation    lymphoma  . Status post radiation therapy   . Thyroid disease     Past Surgical History:  Procedure Laterality Date  . ABDOMINAL EXPLORATION SURGERY  2005-2006   Dr Pat Patrick  . CHOLECYSTECTOMY N/A 05/25/2016   Procedure: LAPAROSCOPIC CHOLECYSTECTOMY WITH INTRAOPERATIVE CHOLANGIOGRAM;  Surgeon: Robert Bellow, MD;  Location: [facility] ORS;  Service: General;  Laterality: N/A;    There were no vitals filed for this visit.  Subjective Assessment - 11/08/17 1124    Subjective   Doing better- I can pick up my pocket book - tried to bake yesterday but could not hold the bowl - ache when using it and do exercises but other wise getting better     Patient Stated Goals  I want to be able  to use my R hand and arm like before     Currently in Pain?  No/denies         [facility] OT Assessment - 11/08/17 0001      AROM   Right Elbow Flexion  140    Right Elbow Extension  -9               OT Treatments/Exercises (OP) - 11/08/17 0001      Moist Heat Therapy   Number Minutes Moist Heat  5 Minutes    Moist Heat Location  Elbow extention stretch at [facility]      Assess scar -scar massage done-All over the flap  area where it is adhered     PROM/AAROMfor elbow flexion - 3 positions - 10 reps Reinforce elbow extention inbetween and supination position - do have some pain after doing end range for one and then change directions    1 lbs weight for elbow flexion and extention in standing against wall  10 reps  And  reach over headAnd extending elbowand back to mat tablein elbow extention10 reps with 1lbs  As well as reaching functionally from L shoulder and horizontal ADD - 10 reps  Better coordination and strength this date  Add and done yellow band for shoulder extention , scapula retraction  And elbow extention -with yellow band double  10 reps   2 x day add to HEP   UBE for 6 min change direction    also did 1 lbs in standing for sup /pro - 10 reps   and can interlock fingers and slide down for stretch for elbow flexion 10 reps  keep pain under 2/10         OT Education - 11/08/17 1126    Education provided  Yes    Education Details  YTB exercises added to HEP     Person(s) Educated  Patient;Child(ren)    Methods  Explanation;Demonstration;Tactile cues    Comprehension  Verbalized understanding;Returned demonstration       OT Short Term Goals - 10/15/17 1843      OT SHORT TERM GOAL #1   Title  Pt to be ind in HEP to decrease scar tissue, increase ROM in L arm     Baseline  stitches still in and bandage - and scab - and decrease ROM in shoulder ,elbow and forearm     Time  3    Period  Weeks    Status  New    Target Date  11/05/17      OT SHORT TERM GOAL #2   Title  R forearm supination improve with more than 30 degrees to turn doorknob and reach for change/objects    Baseline  supination R 45 degrees    Time  3    Period  Weeks    Status  New    Target Date  11/05/17        OT Long Term Goals - 10/15/17 1849      OT LONG TERM GOAL #1   Title  R elbow AROM improve with more than 15 in extention and 30 in flexion to do hair , jewelry ,  and hand tuck shirt in     Baseline  Elbow flexion 93 degrees and extention -23 degrees     Time  5    Period  Weeks    Status  New    Target Date  11/19/17      OT LONG TERM GOAL #2   Title  Strength in R elbow and wrist increase for pt to carry 3-4 lbs without increase symptoms     Baseline  no strength- only doing AAROM - 4 wks s/p    Time  6    Period  Weeks    Status  New    Target Date  11/26/17      OT LONG TERM GOAL #3   Title  Pt will show increase for 20 points on FOTO score     Baseline  pt score at eval on FOTO 18    Time  6    Period  Weeks    Status  New    Target Date  11/26/17            Plan - 11/08/17 1127    Clinical Impression Statement  Pt cont to make progress in AROM in elbow flexion and extention - strength and functional use - add some YTB to HEP for shoulder too     Occupational performance deficits (Please refer to evaluation for details):  ADL's;IADL's;Rest and Sleep;Leisure;Social Participation    Rehab Potential  Good    OT Frequency  2x / week    OT Duration  4 weeks    OT Treatment/Interventions  Self-care/ADL training;Moist Heat;Therapeutic exercise;Scar mobilization;Cryotherapy;Passive range of motion;Manual Therapy;Paraffin    Plan  assess grip strength- upgrade to 2  lbs if indicated     Clinical Decision Making  Multiple treatment options, significant modification of task necessary    OT Home Exercise Plan  see pt instruction    Consulted and Agree with Plan of Care  Patient       Patient will benefit from skilled therapeutic intervention in order to improve the following deficits and impairments:  Decreased skin  integrity, Increased edema, Impaired flexibility, Pain, Decreased scar mobility, Decreased strength, Impaired UE functional use, Decreased range of motion  Visit Diagnosis: Stiffness of right elbow, not elsewhere classified  Stiffness of right shoulder, not elsewhere classified  Muscle weakness (generalized)  Pain in  right elbow  Scar condition and fibrosis of skin    Problem List Patient Active Problem List   Diagnosis Date Noted  . Calculus of gallbladder with acute cholecystitis without obstruction 05/15/2016    Rosalyn Gess OTR/L,CLT 11/08/2017, 11:29 AM  [place] [facility] [address], [place], [address] Phone: [phone]   Fax:  [phone]  Name: BRENLY TRAWICK MRN: [id number] Date of Birth: 1928/02/21

## 2017-11-08 NOTE — Patient Instructions (Signed)
Add yellow band for shoulder extention , scapula retraction  And elbow extention -with yellow band double  10 reps   2 x day add to HEP

## 2017-11-12 ENCOUNTER — Ambulatory Visit: Payer: Medicare Other | Admitting: Occupational Therapy

## 2017-11-14 ENCOUNTER — Ambulatory Visit: Payer: Medicare Other | Admitting: Occupational Therapy

## 2017-11-14 DIAGNOSIS — M6281 Muscle weakness (generalized): Secondary | ICD-10-CM

## 2017-11-14 DIAGNOSIS — M25621 Stiffness of right elbow, not elsewhere classified: Secondary | ICD-10-CM | POA: Diagnosis not present

## 2017-11-14 DIAGNOSIS — M25611 Stiffness of right shoulder, not elsewhere classified: Secondary | ICD-10-CM

## 2017-11-14 DIAGNOSIS — M25521 Pain in right elbow: Secondary | ICD-10-CM

## 2017-11-14 DIAGNOSIS — L905 Scar conditions and fibrosis of skin: Secondary | ICD-10-CM

## 2017-11-14 NOTE — Patient Instructions (Signed)
Increase to 2 lbs for elbow flexion , extention  Punching up in air for shoulder and elbow  Sup/pro with 2 lbs weight   12 reps   2 x day  3 days increase to 2 sets , and then in another 3 days 3 sets   Putty green for gripping , pulling and twisting  12 reps   2 x day

## 2017-11-14 NOTE — Therapy (Signed)
[place] [facility] [address] [place], [address] Phone: [phone]   Fax:  [phone]  Occupational Therapy Treatment  Patient Details  Name: Jeanette Newton MRN: [id number] Date of Birth: December 07, 1927 Referring Provider: Tristan Schroeder   Encounter Date: 11/14/2017  OT End of Session - 11/14/17 1434    Visit Number  7    Number of Visits  16    Date for OT Re-Evaluation  12/10/17    OT Start Time  1301    OT Stop Time  1344    OT Time Calculation (min)  43 min    Activity Tolerance  Patient tolerated treatment well    Behavior During Therapy  [facility] for tasks assessed/performed       Past Medical History:  Diagnosis Date  . Cancer [facility]) 2005-2006   lymphoma  . Hypothyroidism   . Status post chemoradiation    lymphoma  . Status post radiation therapy   . Thyroid disease     Past Surgical History:  Procedure Laterality Date  . ABDOMINAL EXPLORATION SURGERY  2005-2006   Dr Pat Patrick  . CHOLECYSTECTOMY N/A 05/25/2016   Procedure: LAPAROSCOPIC CHOLECYSTECTOMY WITH INTRAOPERATIVE CHOLANGIOGRAM;  Surgeon: Robert Bellow, MD;  Location: [facility] ORS;  Service: General;  Laterality: N/A;    There were no vitals filed for this visit.  Subjective Assessment - 11/14/17 1423    Subjective   Doing okay - did not had time to do my excercises - my husband fell and had bleeding on brain- was airlifted to [place] -and come home yesterday - did not had as much sleep     Patient Stated Goals  I want to be able  to use my R hand and arm like before     Currently in Pain?  No/denies         [facility] OT Assessment - 11/14/17 0001      AROM   Right Elbow Flexion  140    Right Elbow Extension  -18      Strength   Right Hand Grip (lbs)  25    Left Hand Grip (lbs)  42       AROM for elbow extention not as good -husband was in hospital and pt compensate with shoulder extention  And not working elbow extention end range during HEP  As  notice in clinic         OT Treatments/Exercises (OP) - 11/14/17 0001      Moist Heat Therapy   Number Minutes Moist Heat  8 Minutes    Moist Heat Location  Elbow for elbow extention at [facility] prior to scar massage       Assess scar -scar massage done using vibration this date -All over the flap area where it is adhered   PROM/AAROMelbow extention inbetween - do have some painafter doing end range for one and then change directions    Increase to 2 lbs for elbow flexion , extention  Punching up in air for shoulder and elbow( punching)   Sup/pro with 2 lbs weight   12 reps  Needed min A with HEP - daughter present   2 x day  3 days increase to 2 sets , and then in another 3 days 3 sets   Putty green for gripping , pulling and twisting  12 reps   2 x day     OT Education - 11/14/17 1433    Education provided  Yes  Education Details  HEP updated to 2 lbs weight ,and putty     Person(s) Educated  Patient;Child(ren)    Methods  Explanation;Demonstration;Tactile cues;Verbal cues;Handout    Comprehension  Verbalized understanding;Returned demonstration       OT Short Term Goals - 10/15/17 1843      OT SHORT TERM GOAL #1   Title  Pt to be ind in HEP to decrease scar tissue, increase ROM in L arm     Baseline  stitches still in and bandage - and scab - and decrease ROM in shoulder ,elbow and forearm     Time  3    Period  Weeks    Status  New    Target Date  11/05/17      OT SHORT TERM GOAL #2   Title  R forearm supination improve with more than 30 degrees to turn doorknob and reach for change/objects    Baseline  supination R 45 degrees    Time  3    Period  Weeks    Status  New    Target Date  11/05/17        OT Long Term Goals - 10/15/17 1849      OT LONG TERM GOAL #1   Title  R elbow AROM improve with more than 15 in extention and 30 in flexion to do hair , jewelry , and hand tuck shirt in     Baseline  Elbow flexion 93 degrees and extention  -23 degrees     Time  5    Period  Weeks    Status  New    Target Date  11/19/17      OT LONG TERM GOAL #2   Title  Strength in R elbow and wrist increase for pt to carry 3-4 lbs without increase symptoms     Baseline  no strength- only doing AAROM - 4 wks s/p    Time  6    Period  Weeks    Status  New    Target Date  11/26/17      OT LONG TERM GOAL #3   Title  Pt will show increase for 20 points on FOTO score     Baseline  pt score at eval on FOTO 18    Time  6    Period  Weeks    Status  New    Target Date  11/26/17            Plan - 11/14/17 1606    Clinical Impression Statement  Pt cont to make progress in AROM and strength in L elbow- pt husband was in hospital since last time - and she did not do as much HEP - pain not really and report she is using it more    Occupational performance deficits (Please refer to evaluation for details):  ADL's;IADL's;Rest and Sleep;Leisure;Social Participation    Rehab Potential  Good    OT Frequency  1x / week    OT Duration  4 weeks    OT Treatment/Interventions  Self-care/ADL training;Moist Heat;Therapeutic exercise;Scar mobilization;Cryotherapy;Passive range of motion;Manual Therapy;Paraffin    Plan  assess progress with 2 lbs weight and putty - adjust HEP     Clinical Decision Making  Multiple treatment options, significant modification of task necessary    OT Home Exercise Plan  see pt instruction    Consulted and Agree with Plan of Care  Patient       Patient will benefit from skilled  therapeutic intervention in order to improve the following deficits and impairments:  Decreased skin integrity, Increased edema, Impaired flexibility, Pain, Decreased scar mobility, Decreased strength, Impaired UE functional use, Decreased range of motion  Visit Diagnosis: Stiffness of right elbow, not elsewhere classified  Stiffness of right shoulder, not elsewhere classified  Muscle weakness (generalized)  Pain in right elbow  Scar  condition and fibrosis of skin    Problem List Patient Active Problem List   Diagnosis Date Noted  . Calculus of gallbladder with acute cholecystitis without obstruction 05/15/2016    Rosalyn Gess OTR/L,CLT 11/14/2017, 4:10 PM  [place] [facility] [address] [place], [address] Phone: [phone]   Fax:  [phone]  Name: Jeanette Newton MRN: [id number] Date of Birth: 09-04-1927

## 2017-11-22 ENCOUNTER — Ambulatory Visit: Payer: Medicare Other | Admitting: Occupational Therapy

## 2017-11-22 DIAGNOSIS — L905 Scar conditions and fibrosis of skin: Secondary | ICD-10-CM

## 2017-11-22 DIAGNOSIS — M25621 Stiffness of right elbow, not elsewhere classified: Secondary | ICD-10-CM | POA: Diagnosis not present

## 2017-11-22 DIAGNOSIS — M25521 Pain in right elbow: Secondary | ICD-10-CM

## 2017-11-22 DIAGNOSIS — M25611 Stiffness of right shoulder, not elsewhere classified: Secondary | ICD-10-CM

## 2017-11-22 DIAGNOSIS — M6281 Muscle weakness (generalized): Secondary | ICD-10-CM

## 2017-11-22 NOTE — Therapy (Signed)
[place] [facility] [address], [place], [address] Phone: [phone]   Fax:  [phone]  Occupational Therapy Treatment  Patient Details  Name: Jeanette Newton MRN: [id number] Date of Birth: [birth year] Referring Provider: Tristan Schroeder   Encounter Date: 11/22/2017  OT End of Session - 11/22/17 1909    Visit Number  8    Number of Visits  16    Date for OT Re-Evaluation  12/10/17    OT Start Time  1205    OT Stop Time  1254    OT Time Calculation (min)  49 min    Activity Tolerance  Patient tolerated treatment well    Behavior During Therapy  [facility] for tasks assessed/performed       Past Medical History:  Diagnosis Date  . Cancer [facility]) 2005-2006   lymphoma  . Hypothyroidism   . Status post chemoradiation    lymphoma  . Status post radiation therapy   . Thyroid disease     Past Surgical History:  Procedure Laterality Date  . ABDOMINAL EXPLORATION SURGERY  2005-2006   Dr Pat Patrick  . CHOLECYSTECTOMY N/A 05/25/2016   Procedure: LAPAROSCOPIC CHOLECYSTECTOMY WITH INTRAOPERATIVE CHOLANGIOGRAM;  Surgeon: Robert Bellow, MD;  Location: [facility] ORS;  Service: General;  Laterality: N/A;    There were no vitals filed for this visit.  Subjective Assessment - 11/22/17 1906    Subjective   I am seeing the surgeon next week - I am using it much more - do you think my elbow will get so I don't feel it when I use it like now sometimes - it do not hurt just maybe tight and sore     Patient Stated Goals  I want to be able  to use my R hand and arm like before     Currently in Pain?  No/denies         [facility] OT Assessment - 11/22/17 0001      AROM   Right Elbow Flexion  145    Right Elbow Extension  -5      Strength   Right Hand Grip (lbs)  30    Left Hand Grip (lbs)  42         assess scar , progress in AROM , functional use and strength  4/5 for elbow  Able to do 2 sets with 2 lbs - with out pain        OT  Treatments/Exercises (OP) - 11/22/17 0001      RUE Paraffin   Number Minutes Paraffin  10 Minutes    RUE Paraffin Location  -- elbow    Comments  Prior to manual therapy - scar massage        Scar massage done using vibration this date and manual by OT  -All over the flap area where it is adhered   PROM/AAROMelbow extention inbetween - do have some painafter doing end range for one and then change directions  Increase to 3 lbs for elbow flexion , extention   Sup/pro with 2 lbs weight   12 reps  3 days increase to 2 sets , and then in another 3 days 3 sets   Attempted in supine elbow extention with 2 or 3 lbs or kick backs standing but hard time   change and done wall pushups   done very well   pt to do 12 reps   2 x day  Pain free   Putty  green for gripping , no  pulling and twisting any more  12 reps   2 x day        OT Education - 11/22/17 1909    Education provided  Yes    Education Details  HEP     Person(s) Educated  Patient;Child(ren)    Methods  Explanation;Demonstration;Verbal cues;Tactile cues    Comprehension  Verbalized understanding;Returned demonstration       OT Short Term Goals - 11/22/17 1911      OT SHORT TERM GOAL #1   Title  Pt to be ind in HEP to decrease scar tissue, increase ROM in L arm     Status  Achieved      OT SHORT TERM GOAL #2   Title  R forearm supination improve with more than 30 degrees to turn doorknob and reach for change/objects    Status  Achieved        OT Long Term Goals - 11/22/17 1912      OT LONG TERM GOAL #1   Title  R elbow AROM improve with more than 15 in extention and 30 in flexion to do hair , jewelry , and hand tuck shirt in     Status  Achieved      OT LONG TERM GOAL #2   Title  Strength in R elbow and wrist increase for pt to carry 3-4 lbs without increase symptoms     Baseline  JUst initiate 3 lbs this date     Time  3    Period  Weeks    Status  On-going    Target Date  12/06/17       OT LONG TERM GOAL #3   Title  Pt will show increase for 20 points on FOTO score     Baseline  pt score at eval on FOTO 18 - to be assess next time    Time  3    Period  Weeks    Status  On-going    Target Date  12/13/17            Plan - 11/22/17 1910    Clinical Impression Statement  Pt cont to make progress in AROM at R elbow , increase strength - able to increase to 3 lbs this date - decrease scar tissue and report increase functional use - to see surgeon next week     Occupational performance deficits (Please refer to evaluation for details):  ADL's;IADL's;Rest and Sleep;Leisure;Social Participation    Rehab Potential  Good    OT Frequency  Biweekly    OT Duration  4 weeks    OT Treatment/Interventions  Self-care/ADL training;Moist Heat;Therapeutic exercise;Scar mobilization;Cryotherapy;Passive range of motion;Manual Therapy;Paraffin    Plan  results from surgeon visit - do PREE out come and possible discharge     Clinical Decision Making  Multiple treatment options, significant modification of task necessary    OT Home Exercise Plan  see pt instruction    Consulted and Agree with Plan of Care  Patient;Family member/caregiver       Patient will benefit from skilled therapeutic intervention in order to improve the following deficits and impairments:  Decreased skin integrity, Increased edema, Impaired flexibility, Pain, Decreased scar mobility, Decreased strength, Impaired UE functional use, Decreased range of motion  Visit Diagnosis: Stiffness of right elbow, not elsewhere classified  Stiffness of right shoulder, not elsewhere classified  Muscle weakness (generalized)  Pain in right elbow  Scar condition and fibrosis of skin  Problem List Patient Active Problem List   Diagnosis Date Noted  . Calculus of gallbladder with acute cholecystitis without obstruction 05/15/2016    Rosalyn Gess OTR/L,CLT 11/22/2017, 7:14 PM  [place] [facility] [address] [place], [address] Phone: [phone]   Fax:  [phone]  Name: Jeanette Newton MRN: [id number] Date of Birth: 1927-10-12

## 2017-11-22 NOTE — Patient Instructions (Signed)
Upgrade to 3 lbs weight for elbow flexion - palm up and thumb up  12 reps  And sup/pro 12 reps  1-2x day  And wall push up  But should be pain free    Can increase to 2 sets 1 x day in 3 days if pain free  And then 6 days  3 sets  Pain free and 1 x day

## 2017-12-05 ENCOUNTER — Ambulatory Visit: Payer: Medicare Other | Attending: Orthopedic Surgery | Admitting: Occupational Therapy

## 2017-12-05 DIAGNOSIS — M25621 Stiffness of right elbow, not elsewhere classified: Secondary | ICD-10-CM | POA: Insufficient documentation

## 2017-12-05 DIAGNOSIS — M6281 Muscle weakness (generalized): Secondary | ICD-10-CM | POA: Insufficient documentation

## 2017-12-05 DIAGNOSIS — M25611 Stiffness of right shoulder, not elsewhere classified: Secondary | ICD-10-CM | POA: Insufficient documentation

## 2017-12-05 DIAGNOSIS — M25521 Pain in right elbow: Secondary | ICD-10-CM | POA: Diagnosis present

## 2017-12-05 DIAGNOSIS — L905 Scar conditions and fibrosis of skin: Secondary | ICD-10-CM | POA: Insufficient documentation

## 2017-12-05 NOTE — Therapy (Signed)
[place] [facility] [address], [place], [address] Phone: [phone]   Fax:  [phone]  Occupational Therapy Treatment/ discharge  Patient Details  Name: Jeanette Newton MRN: [id number] Date of Birth: [birth year] Referring Provider: Tristan Schroeder   Encounter Date: 12/05/2017  OT End of Session - 12/05/17 1402    Visit Number  9    Number of Visits  9    Date for OT Re-Evaluation  11/28/17    OT Start Time  1325    OT Stop Time  1400    OT Time Calculation (min)  35 min    Activity Tolerance  Patient tolerated treatment well    Behavior During Therapy  [facility] for tasks assessed/performed       Past Medical History:  Diagnosis Date  . Cancer [facility]) 2005-2006   lymphoma  . Hypothyroidism   . Status post chemoradiation    lymphoma  . Status post radiation therapy   . Thyroid disease     Past Surgical History:  Procedure Laterality Date  . ABDOMINAL EXPLORATION SURGERY  2005-2006   Dr Pat Patrick  . CHOLECYSTECTOMY N/A 05/25/2016   Procedure: LAPAROSCOPIC CHOLECYSTECTOMY WITH INTRAOPERATIVE CHOLANGIOGRAM;  Surgeon: Robert Bellow, MD;  Location: [facility] ORS;  Service: General;  Laterality: N/A;    There were no vitals filed for this visit.  Subjective Assessment - 12/05/17 1359    Subjective   I have seen the surgeon -and he  release me - except if I have issues or pain can phone him - I am doing most everything - still maybe little stiff at times - and do not try and pick up something heavy     Patient Stated Goals  I want to be able  to use my R hand and arm like before     Currently in Pain?  No/denies         [facility] OT Assessment - 12/05/17 0001      AROM   Right Elbow Flexion  145    Right Elbow Extension  -3      Strength   Right Hand Grip (lbs)  35    Left Hand Grip (lbs)  40      Pt AROM for R elbow WFL - still decrease by 3-5 degrees - but pt can cont at home   strength 4+/5 - can cont with chair or  wall pushups Scar doing great and adhesions so much better - pt can cont to work little more on scar mobs for adhesion for another 2 wks   Functionally on PREE - ask and simulate some act  - pt able to do all act - except carry 10 lbs - had strain with 8 lbs or more - but could pick up and lift 5 lbs  Discuss with pt safety around the house - shower , railing at stairs and NOT get on ladder  Pt driving again -  Pt discharge at this time                  OT Education - 12/05/17 1401    Education provided  Yes    Education Details  discharg instructions    Person(s) Educated  Patient    Methods  Explanation;Demonstration;Tactile cues    Comprehension  Verbalized understanding;Returned demonstration       OT Short Term Goals - 12/05/17 1405      OT SHORT TERM GOAL #1   Title  Pt to be ind in HEP to decrease scar tissue, increase ROM in L arm     Status  Achieved      OT SHORT TERM GOAL #2   Title  R forearm supination improve with more than 30 degrees to turn doorknob and reach for change/objects    Status  Achieved        OT Long Term Goals - 12/05/17 1405      OT LONG TERM GOAL #1   Title  R elbow AROM improve with more than 15 in extention and 30 in flexion to do hair , jewelry , and hand tuck shirt in     Status  Achieved      OT LONG TERM GOAL #2   Title  Strength in R elbow and wrist increase for pt to carry 3-4 lbs without increase symptoms     Status  Achieved      OT LONG TERM GOAL #3   Title  Pt will show increase for 20 points on FOTO score     Baseline  pt score at eval on FOTO 18 - no issues     Status  Achieved            Plan - 12/05/17 1403    Clinical Impression Statement  Pt is about 2 and 1/2 months out from R elbow ORIF and skin graft - pt made fantastic progress in pain , scar healing , AROM and strength - pt able to use hand in all activities - pt is L hand dominant - pt is at about lifting 5 lbs with ease - and carry about 8 lbs   without discomfort - pt to gradually increase her strength without pain - pt discharge at this time     Plan  discharge instruction     Clinical Decision Making  Limited treatment options, no task modification necessary    OT Home Exercise Plan  see pt instruction    Consulted and Agree with Plan of Care  Patient;Family member/caregiver       Patient will benefit from skilled therapeutic intervention in order to improve the following deficits and impairments:     Visit Diagnosis: Stiffness of right elbow, not elsewhere classified  Stiffness of right shoulder, not elsewhere classified  Muscle weakness (generalized)  Pain in right elbow  Scar condition and fibrosis of skin    Problem List Patient Active Problem List   Diagnosis Date Noted  . Calculus of gallbladder with acute cholecystitis without obstruction 05/15/2016    Rosalyn Gess OTR/L,CLT 12/05/2017, 2:06 PM  [place] [facility] [address] [place], [address] Phone: [phone]   Fax:  [phone]  Name: Jeanette Newton MRN: [id number] Date of Birth: 09/18/1927

## 2017-12-05 NOTE — Patient Instructions (Signed)
Can do another 2 wks of scar massage and push ups  And cont to increase use of R arm - gradually without pain

## 2018-02-04 ENCOUNTER — Other Ambulatory Visit: Payer: Self-pay | Admitting: Internal Medicine

## 2018-02-04 DIAGNOSIS — Z1231 Encounter for screening mammogram for malignant neoplasm of breast: Secondary | ICD-10-CM

## 2018-03-12 ENCOUNTER — Ambulatory Visit
Admission: RE | Admit: 2018-03-12 | Discharge: 2018-03-12 | Disposition: A | Discharge status: Home / Self Care | Payer: Medicare Other | Source: Ambulatory Visit | Attending: Internal Medicine | Admitting: Internal Medicine

## 2018-03-12 DIAGNOSIS — Z1231 Encounter for screening mammogram for malignant neoplasm of breast: Secondary | ICD-10-CM | POA: Insufficient documentation

## 2018-03-12 IMAGING — MG MM DIGITAL SCREENING BILAT W/ TOMO W/ CAD
8 series · 9 of 24 positions shown · non-contrast
Comparison: Previous exam(s).

CLINICAL DATA: Screening.

EXAM:
DIGITAL SCREENING BILATERAL MAMMOGRAM WITH TOMO AND CAD

[L MLO synth-2D]
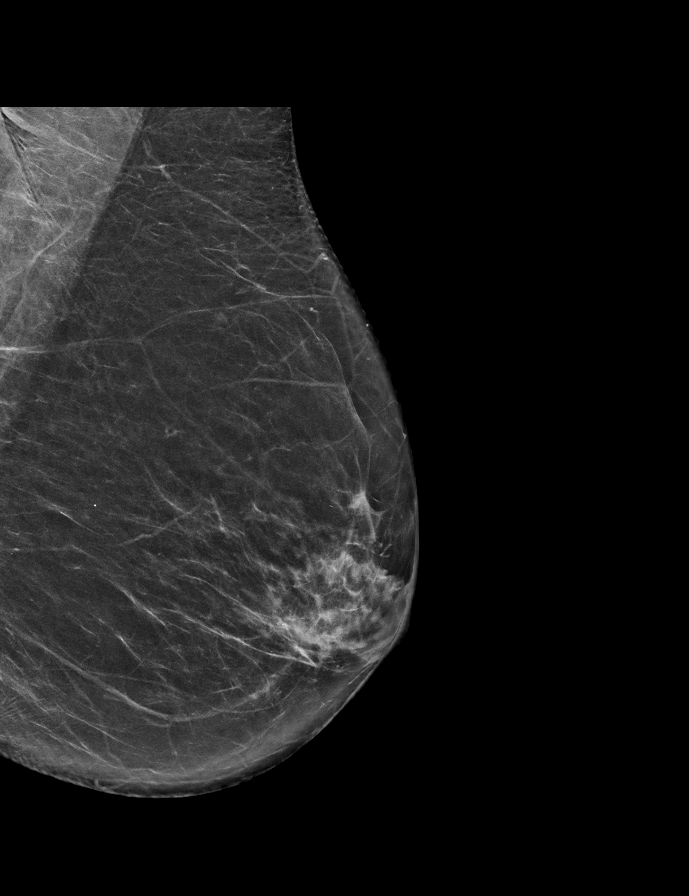

[R MLO synth-2D]
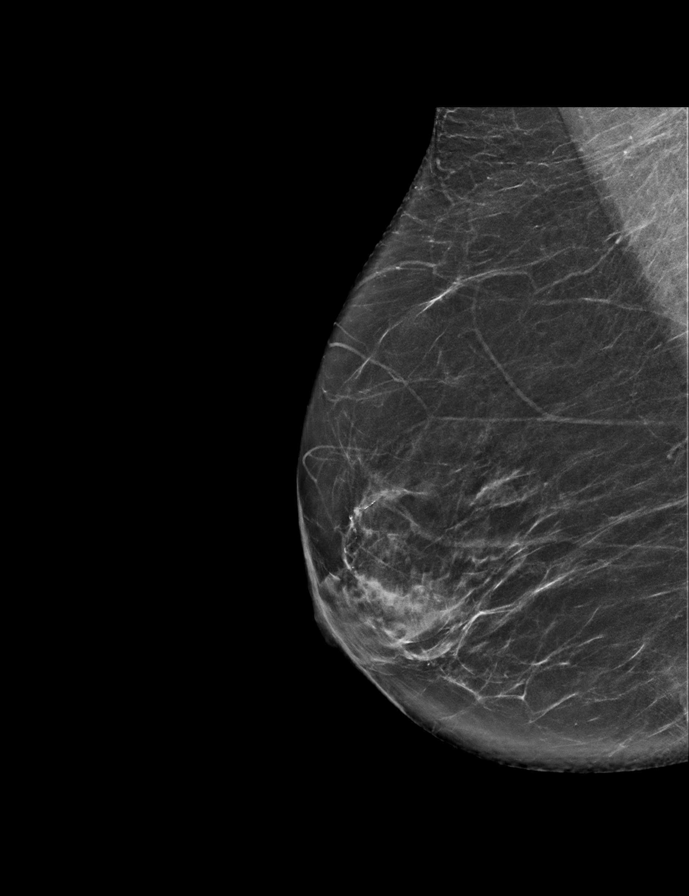

[L CC synth-2D]
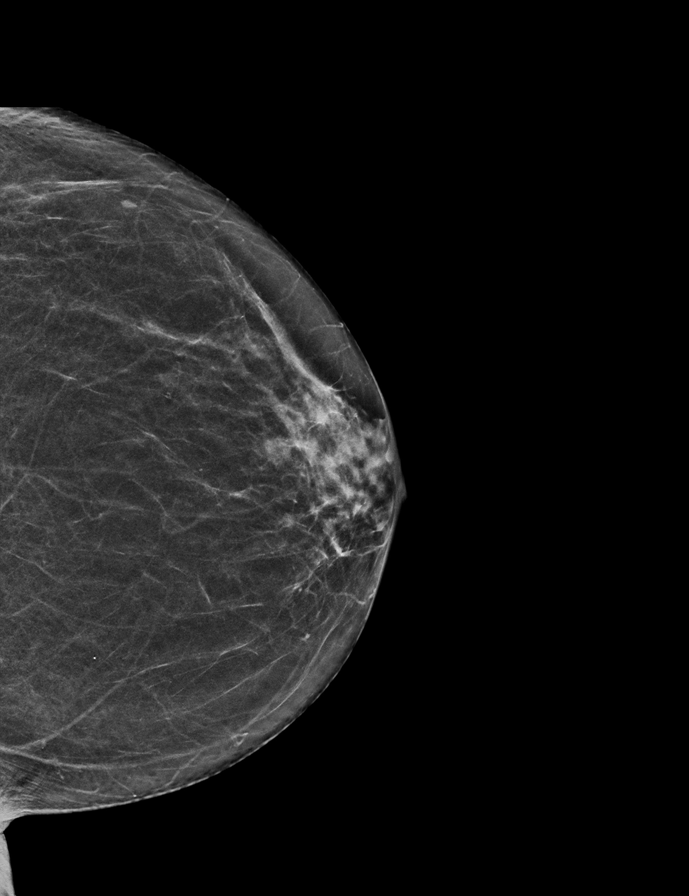

[R CC synth-2D]
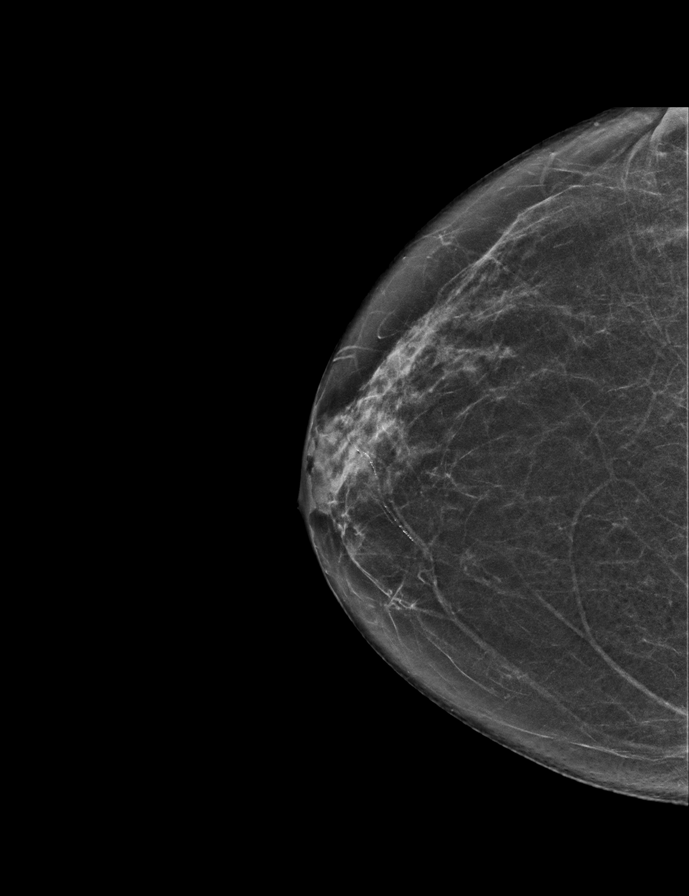

[L MLO tomo · 2 of 67 frames shown]
[frame 22/67]
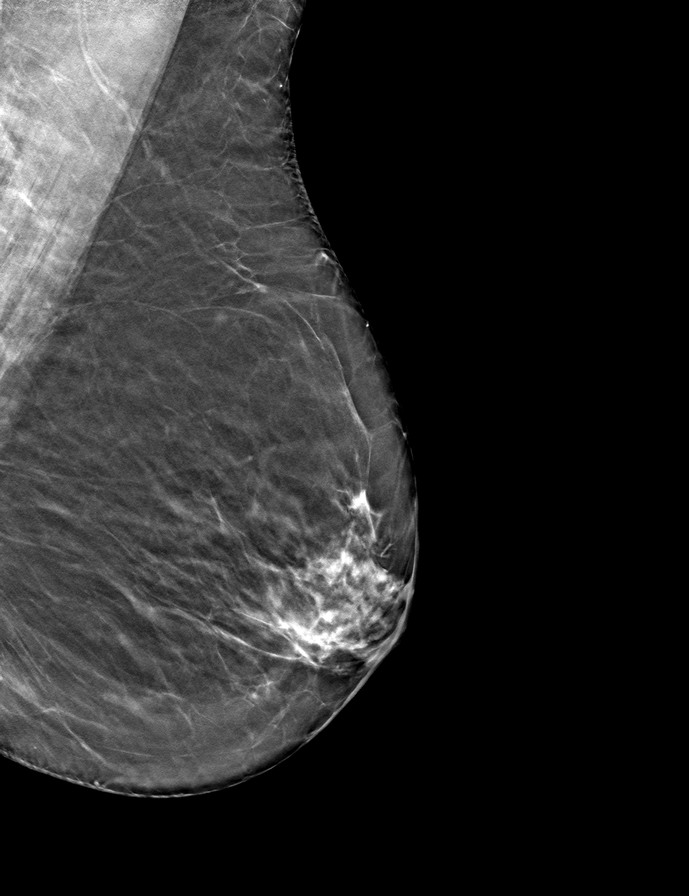
[frame 34/67]
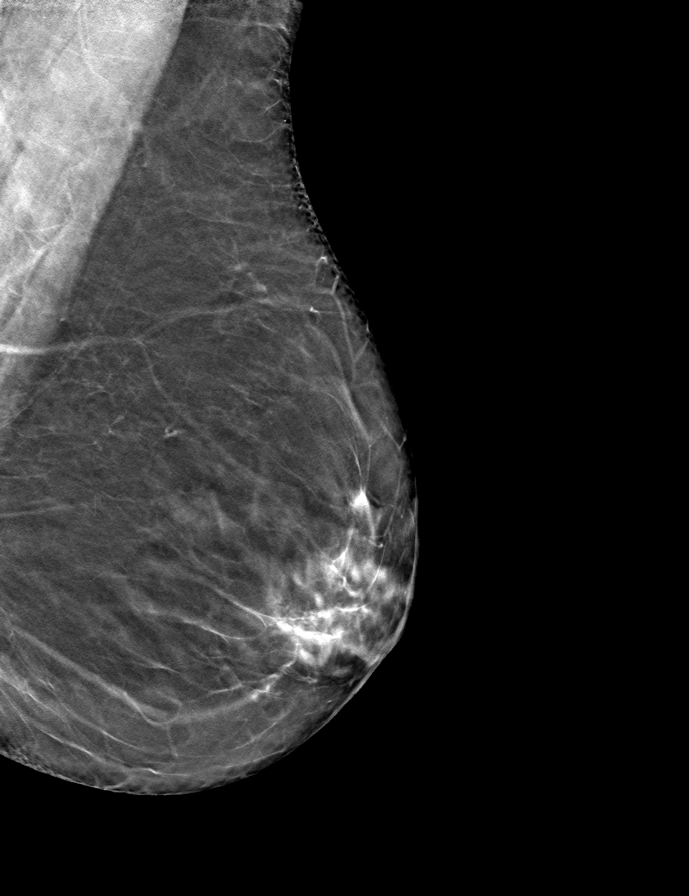

[R CC tomo · tomo slice 29/56.0]
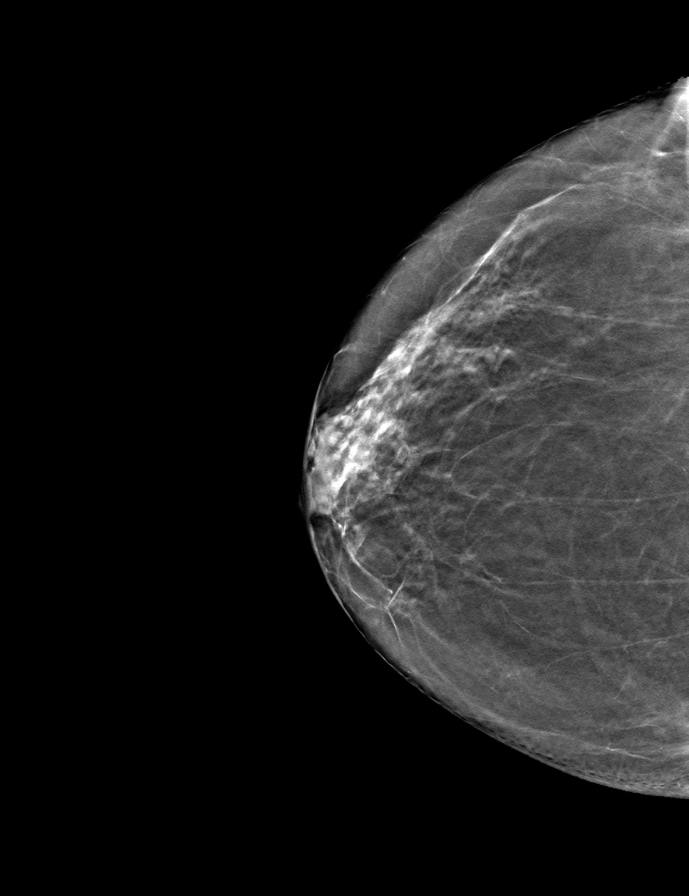

[R MLO tomo · tomo slice 34/67.0]
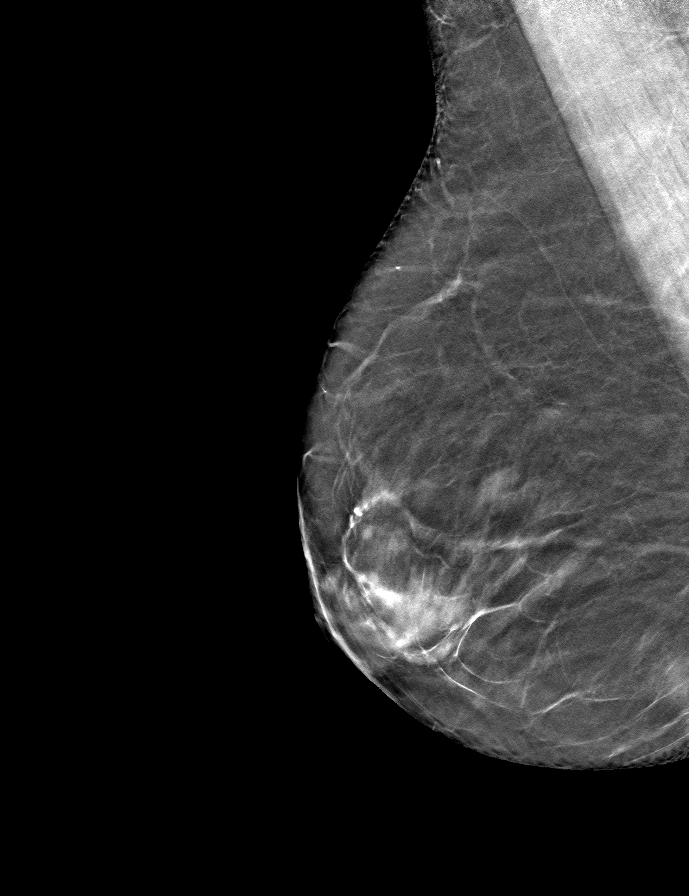

[L CC tomo · tomo slice 25/50.0]
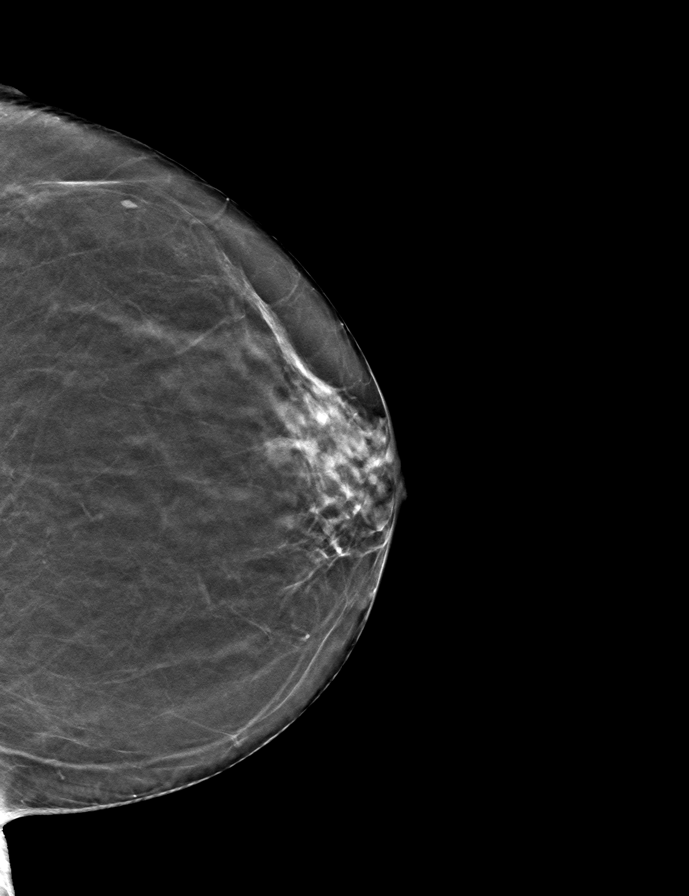

[9 of 24 positions shown; findings below may reference images not displayed]

ACR Breast Density Category b: There are scattered areas of
fibroglandular density.
FINDINGS: There are no findings suspicious for malignancy. Images were
processed with CAD.
IMPRESSION: No mammographic evidence of malignancy. A result letter of this
screening mammogram will be mailed directly to the patient.

RECOMMENDATION:
Screening mammogram in one year. (Code:[TQ])

BI-RADS CATEGORY  1: Negative.

## 2019-02-03 ENCOUNTER — Other Ambulatory Visit: Payer: Self-pay | Admitting: Internal Medicine

## 2019-02-03 DIAGNOSIS — Z1231 Encounter for screening mammogram for malignant neoplasm of breast: Secondary | ICD-10-CM

## 2019-03-18 ENCOUNTER — Ambulatory Visit
Admission: RE | Admit: 2019-03-18 | Discharge: 2019-03-18 | Disposition: A | Discharge status: Home / Self Care | Payer: Medicare Other | Source: Ambulatory Visit | Attending: Internal Medicine | Admitting: Internal Medicine

## 2019-03-18 ENCOUNTER — Other Ambulatory Visit: Payer: Self-pay

## 2019-03-18 DIAGNOSIS — Z1231 Encounter for screening mammogram for malignant neoplasm of breast: Secondary | ICD-10-CM | POA: Diagnosis not present

## 2019-03-18 IMAGING — MG DIGITAL SCREENING BILATERAL MAMMOGRAM WITH TOMO AND CAD
6 of 12 series · 6 of 36 positions shown · non-contrast
Comparison: Previous exam(s).

CLINICAL DATA: Screening.

EXAM:
DIGITAL SCREENING BILATERAL MAMMOGRAM WITH TOMO AND CAD

[L MLO synth-2D (1 of 2)]
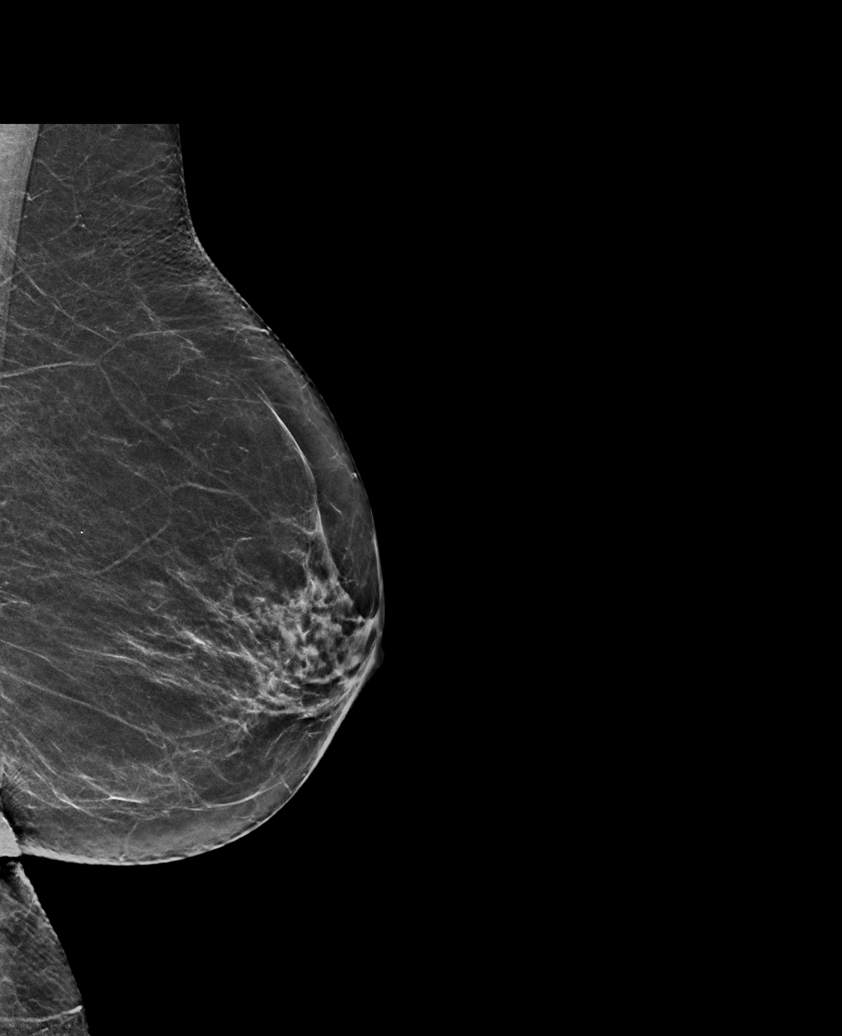

[L MLO synth-2D (2 of 2)]
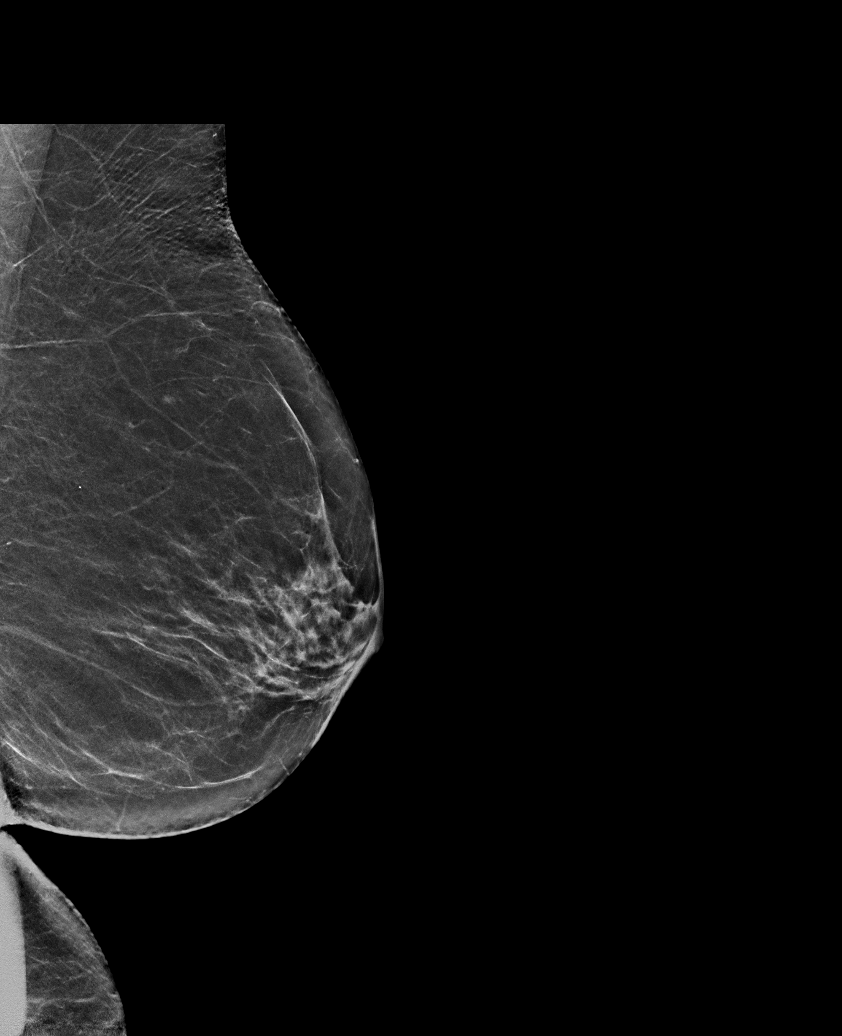

[R CC synth-2D (1 of 2)]
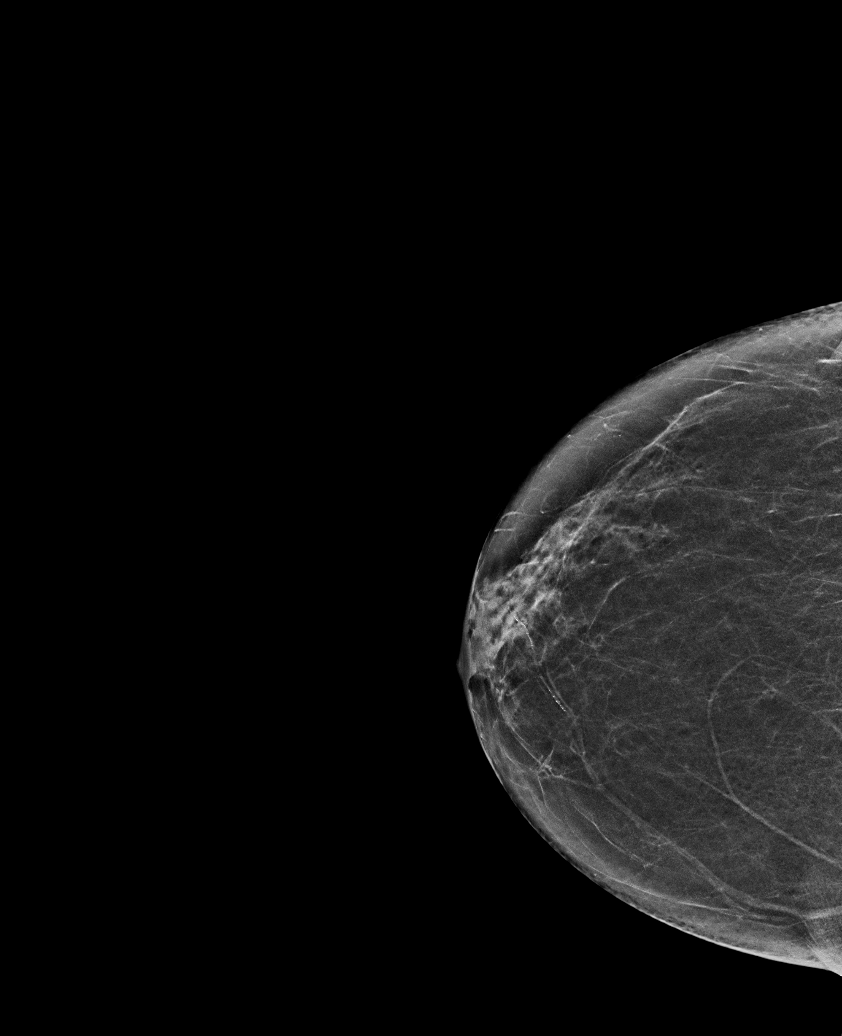

[R CC synth-2D (2 of 2)]
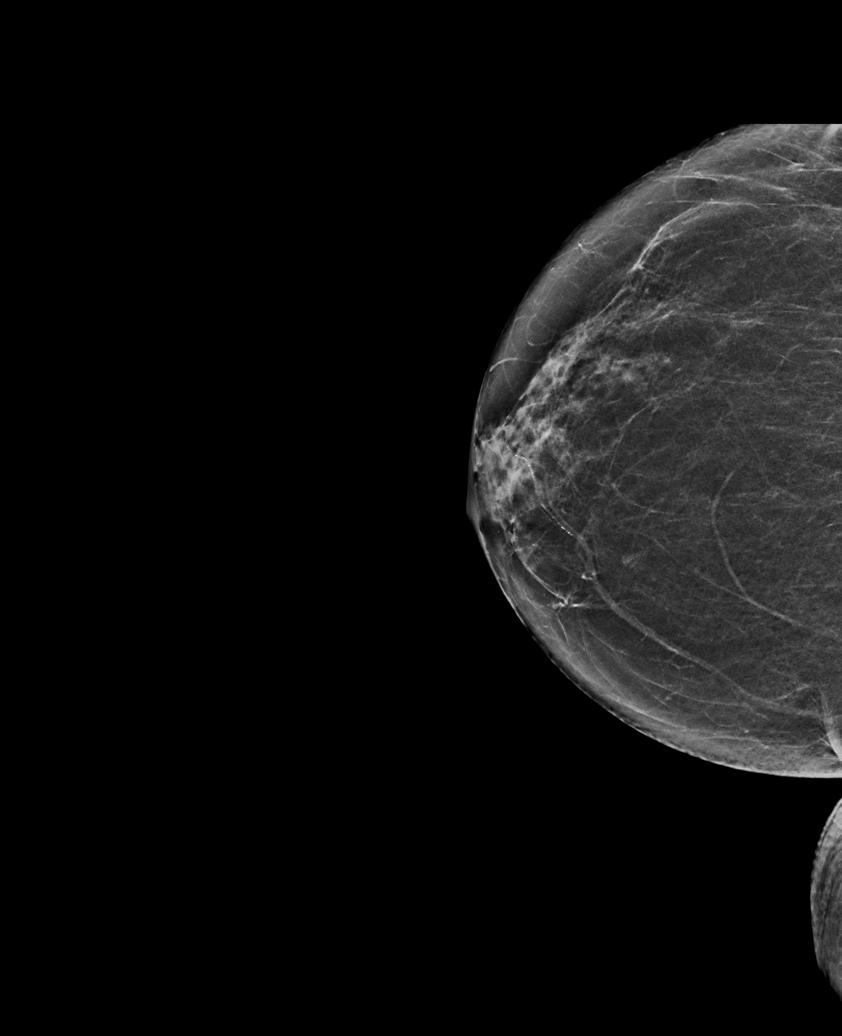

[L CC synth-2D]
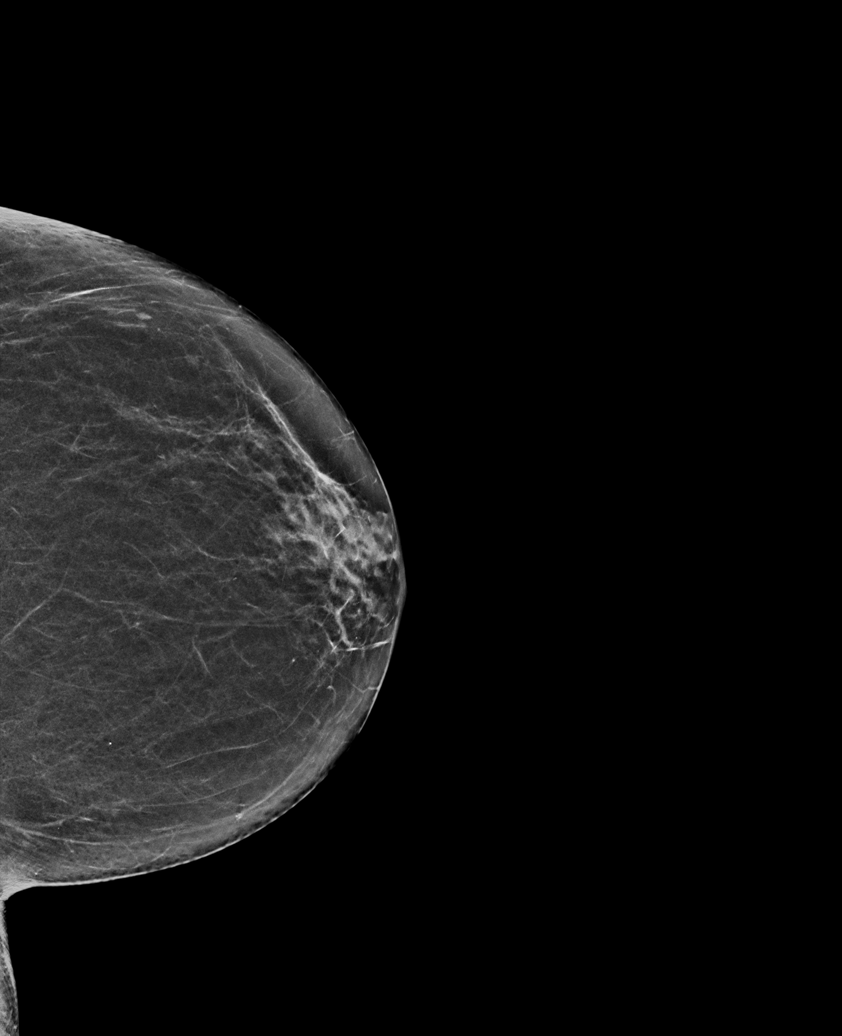

[R MLO synth-2D]
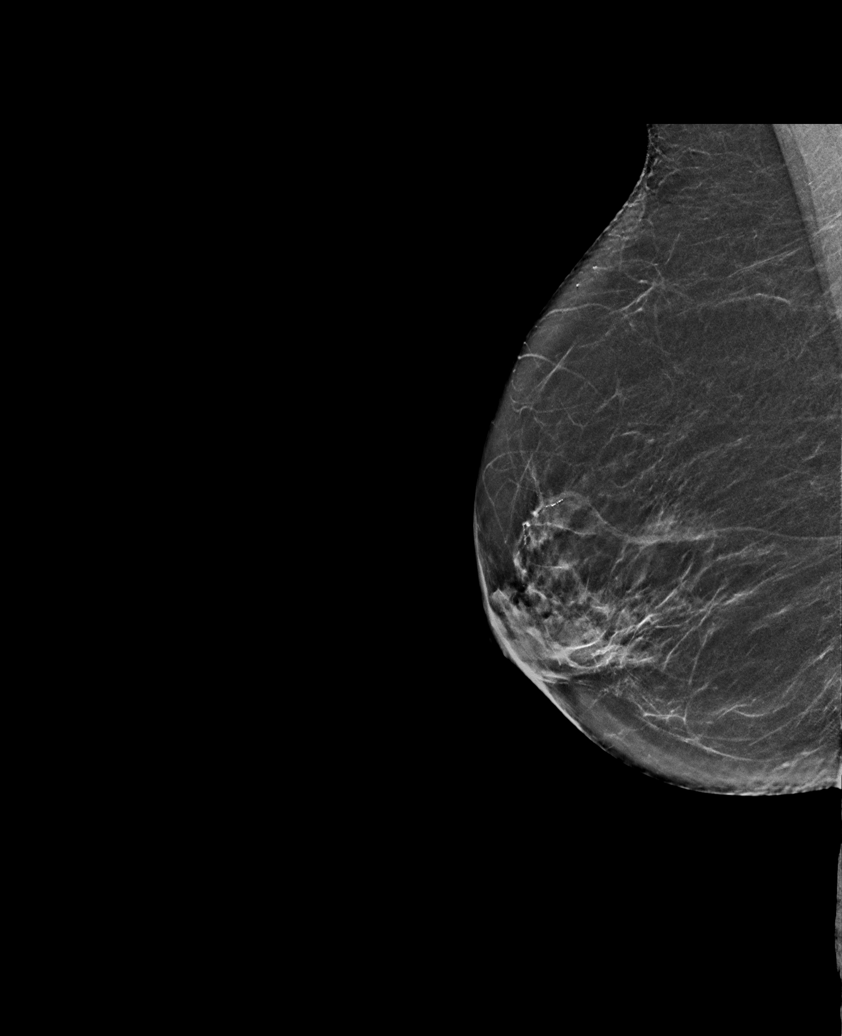

[6 of 36 positions shown; findings below may reference images not displayed]

ACR Breast Density Category b: There are scattered areas of
fibroglandular density.
FINDINGS: There are no findings suspicious for malignancy. Images were
processed with CAD.
IMPRESSION: No mammographic evidence of malignancy. A result letter of this
screening mammogram will be mailed directly to the patient.

RECOMMENDATION:
Screening mammogram in one year. (Code:[TQ])

BI-RADS CATEGORY  1: Negative.

## 2019-05-29 ENCOUNTER — Other Ambulatory Visit: Payer: Self-pay

## 2019-05-29 DIAGNOSIS — Z23 Encounter for immunization: Secondary | ICD-10-CM

## 2019-05-29 DIAGNOSIS — Z9221 Personal history of antineoplastic chemotherapy: Secondary | ICD-10-CM

## 2019-05-29 DIAGNOSIS — K668 Other specified disorders of peritoneum: Secondary | ICD-10-CM | POA: Diagnosis present

## 2019-05-29 DIAGNOSIS — K660 Peritoneal adhesions (postprocedural) (postinfection): Secondary | ICD-10-CM | POA: Diagnosis present

## 2019-05-29 DIAGNOSIS — Z880 Allergy status to penicillin: Secondary | ICD-10-CM

## 2019-05-29 DIAGNOSIS — K439 Ventral hernia without obstruction or gangrene: Secondary | ICD-10-CM | POA: Diagnosis present

## 2019-05-29 DIAGNOSIS — Z20828 Contact with and (suspected) exposure to other viral communicable diseases: Secondary | ICD-10-CM | POA: Diagnosis present

## 2019-05-29 DIAGNOSIS — E039 Hypothyroidism, unspecified: Secondary | ICD-10-CM | POA: Diagnosis present

## 2019-05-29 DIAGNOSIS — Z881 Allergy status to other antibiotic agents status: Secondary | ICD-10-CM

## 2019-05-29 DIAGNOSIS — E876 Hypokalemia: Secondary | ICD-10-CM | POA: Diagnosis not present

## 2019-05-29 DIAGNOSIS — K5700 Diverticulitis of small intestine with perforation and abscess without bleeding: Principal | ICD-10-CM | POA: Diagnosis present

## 2019-05-29 DIAGNOSIS — Z8572 Personal history of non-Hodgkin lymphomas: Secondary | ICD-10-CM

## 2019-05-29 DIAGNOSIS — Z79899 Other long term (current) drug therapy: Secondary | ICD-10-CM

## 2019-05-29 DIAGNOSIS — Z9049 Acquired absence of other specified parts of digestive tract: Secondary | ICD-10-CM

## 2019-05-29 DIAGNOSIS — Z79890 Hormone replacement therapy (postmenopausal): Secondary | ICD-10-CM

## 2019-05-29 DIAGNOSIS — Z923 Personal history of irradiation: Secondary | ICD-10-CM

## 2019-05-29 DIAGNOSIS — Z7982 Long term (current) use of aspirin: Secondary | ICD-10-CM

## 2019-05-29 DIAGNOSIS — Z888 Allergy status to other drugs, medicaments and biological substances status: Secondary | ICD-10-CM

## 2019-05-29 LAB — COMPREHENSIVE METABOLIC PANEL
ALT: 75 U/L — ABNORMAL HIGH (ref 0–44)
AST: 87 U/L — ABNORMAL HIGH (ref 15–41)
Albumin: 3.4 g/dL — ABNORMAL LOW (ref 3.5–5.0)
Alkaline Phosphatase: 83 U/L (ref 38–126)
Anion gap: 9 (ref 5–15)
BUN: 23 mg/dL (ref 8–23)
CO2: 24 mmol/L (ref 22–32)
Calcium: 8.9 mg/dL (ref 8.9–10.3)
Chloride: 101 mmol/L (ref 98–111)
Creatinine, Ser: 1.28 mg/dL — ABNORMAL HIGH (ref 0.44–1.00)
GFR calc Af Amer: 43 mL/min — ABNORMAL LOW (ref >60)
GFR calc non Af Amer: 37 mL/min — ABNORMAL LOW (ref >60)
Glucose, Bld: 145 mg/dL — ABNORMAL HIGH (ref 70–99)
Potassium: 3.7 mmol/L (ref 3.5–5.1)
Sodium: 134 mmol/L — ABNORMAL LOW (ref 135–145)
Total Bilirubin: 2.7 mg/dL — ABNORMAL HIGH (ref 0.3–1.2)
Total Protein: 6.4 g/dL — ABNORMAL LOW (ref 6.5–8.1)

## 2019-05-29 LAB — CBC
HCT: 38.7 % (ref 36.0–46.0)
Hemoglobin: 13 g/dL (ref 12.0–15.0)
MCH: 29.9 pg (ref 26.0–34.0)
MCHC: 33.6 g/dL (ref 30.0–36.0)
MCV: 89 fL (ref 80.0–100.0)
Platelets: 200 10*3/uL (ref 150–400)
RBC: 4.35 MIL/uL (ref 3.87–5.11)
RDW: 13.5 % (ref 11.5–15.5)
WBC: 13.8 10*3/uL — ABNORMAL HIGH (ref 4.0–10.5)
nRBC: 0 % (ref 0.0–0.2)

## 2019-05-29 LAB — LIPASE, BLOOD: Lipase: 33 U/L (ref 11–51)

## 2019-05-29 NOTE — ED Triage Notes (Signed)
Reports started vomiting yesterday.  Family reports not acting herself.

## 2019-05-30 ENCOUNTER — Emergency Department: Payer: Medicare Other | Admitting: Anesthesiology

## 2019-05-30 ENCOUNTER — Emergency Department: Payer: Medicare Other

## 2019-05-30 ENCOUNTER — Inpatient Hospital Stay
Admission: EM | Admit: 2019-05-30 | Discharge: 2019-06-03 | DRG: 331 | Disposition: A | Discharge status: Home / Self Care | Payer: Medicare Other | Attending: Surgery | Admitting: Surgery

## 2019-05-30 ENCOUNTER — Encounter
Admission: EM | Disposition: A | Discharge status: Home / Self Care | Payer: Self-pay | Source: Home / Self Care | Attending: Surgery

## 2019-05-30 DIAGNOSIS — K668 Other specified disorders of peritoneum: Secondary | ICD-10-CM

## 2019-05-30 DIAGNOSIS — K439 Ventral hernia without obstruction or gangrene: Secondary | ICD-10-CM | POA: Diagnosis present

## 2019-05-30 DIAGNOSIS — Z880 Allergy status to penicillin: Secondary | ICD-10-CM | POA: Diagnosis not present

## 2019-05-30 DIAGNOSIS — Z9221 Personal history of antineoplastic chemotherapy: Secondary | ICD-10-CM | POA: Diagnosis not present

## 2019-05-30 DIAGNOSIS — Z888 Allergy status to other drugs, medicaments and biological substances status: Secondary | ICD-10-CM | POA: Diagnosis not present

## 2019-05-30 DIAGNOSIS — Z79890 Hormone replacement therapy (postmenopausal): Secondary | ICD-10-CM | POA: Diagnosis not present

## 2019-05-30 DIAGNOSIS — Z8572 Personal history of non-Hodgkin lymphomas: Secondary | ICD-10-CM | POA: Diagnosis not present

## 2019-05-30 DIAGNOSIS — Z923 Personal history of irradiation: Secondary | ICD-10-CM | POA: Diagnosis not present

## 2019-05-30 DIAGNOSIS — K660 Peritoneal adhesions (postprocedural) (postinfection): Secondary | ICD-10-CM | POA: Diagnosis present

## 2019-05-30 DIAGNOSIS — E039 Hypothyroidism, unspecified: Secondary | ICD-10-CM | POA: Diagnosis present

## 2019-05-30 DIAGNOSIS — Z7982 Long term (current) use of aspirin: Secondary | ICD-10-CM | POA: Diagnosis not present

## 2019-05-30 DIAGNOSIS — E876 Hypokalemia: Secondary | ICD-10-CM | POA: Diagnosis not present

## 2019-05-30 DIAGNOSIS — K631 Perforation of intestine (nontraumatic): Secondary | ICD-10-CM

## 2019-05-30 DIAGNOSIS — K5700 Diverticulitis of small intestine with perforation and abscess without bleeding: Secondary | ICD-10-CM | POA: Diagnosis present

## 2019-05-30 DIAGNOSIS — Z23 Encounter for immunization: Secondary | ICD-10-CM | POA: Diagnosis present

## 2019-05-30 DIAGNOSIS — Z9049 Acquired absence of other specified parts of digestive tract: Secondary | ICD-10-CM | POA: Diagnosis not present

## 2019-05-30 DIAGNOSIS — Z79899 Other long term (current) drug therapy: Secondary | ICD-10-CM | POA: Diagnosis not present

## 2019-05-30 DIAGNOSIS — Z20828 Contact with and (suspected) exposure to other viral communicable diseases: Secondary | ICD-10-CM | POA: Diagnosis present

## 2019-05-30 DIAGNOSIS — Z881 Allergy status to other antibiotic agents status: Secondary | ICD-10-CM | POA: Diagnosis not present

## 2019-05-30 HISTORY — PX: LAPAROTOMY: SHX154

## 2019-05-30 LAB — URINALYSIS, COMPLETE (UACMP) WITH MICROSCOPIC
Bacteria, UA: NONE SEEN
Bilirubin Urine: NEGATIVE
Glucose, UA: NEGATIVE mg/dL
Ketones, ur: NEGATIVE mg/dL
Nitrite: NEGATIVE
Protein, ur: NEGATIVE mg/dL
Specific Gravity, Urine: 1.012 (ref 1.005–1.030)
pH: 5 (ref 5.0–8.0)

## 2019-05-30 LAB — CBC
HCT: 42.2 % (ref 36.0–46.0)
Hemoglobin: 13.9 g/dL (ref 12.0–15.0)
MCH: 29.5 pg (ref 26.0–34.0)
MCHC: 32.9 g/dL (ref 30.0–36.0)
MCV: 89.6 fL (ref 80.0–100.0)
Platelets: 187 10*3/uL (ref 150–400)
RBC: 4.71 MIL/uL (ref 3.87–5.11)
RDW: 13.3 % (ref 11.5–15.5)
WBC: 9.9 10*3/uL (ref 4.0–10.5)
nRBC: 0 % (ref 0.0–0.2)

## 2019-05-30 LAB — PROTIME-INR
INR: 1.3 — ABNORMAL HIGH (ref 0.8–1.2)
Prothrombin Time: 16.4 seconds — ABNORMAL HIGH (ref 11.4–15.2)

## 2019-05-30 LAB — PROCALCITONIN: Procalcitonin: 0.63 ng/mL

## 2019-05-30 LAB — CREATININE, SERUM
Creatinine, Ser: 1.09 mg/dL — ABNORMAL HIGH (ref 0.44–1.00)
GFR calc Af Amer: 52 mL/min — ABNORMAL LOW (ref >60)
GFR calc non Af Amer: 45 mL/min — ABNORMAL LOW (ref >60)

## 2019-05-30 LAB — LACTIC ACID, PLASMA: Lactic Acid, Venous: 1.2 mmol/L (ref 0.5–1.9)

## 2019-05-30 LAB — SARS CORONAVIRUS 2 BY RT PCR (HOSPITAL ORDER, PERFORMED IN ~~LOC~~ HOSPITAL LAB): SARS Coronavirus 2: NEGATIVE

## 2019-05-30 IMAGING — DX DG CHEST 1V PORT
1 series · 1 of 1 positions shown · non-contrast
Comparison: None.

CLINICAL DATA: Preop.  History of lymphoma.

EXAM:
PORTABLE CHEST 1 VIEW

[chest ap]
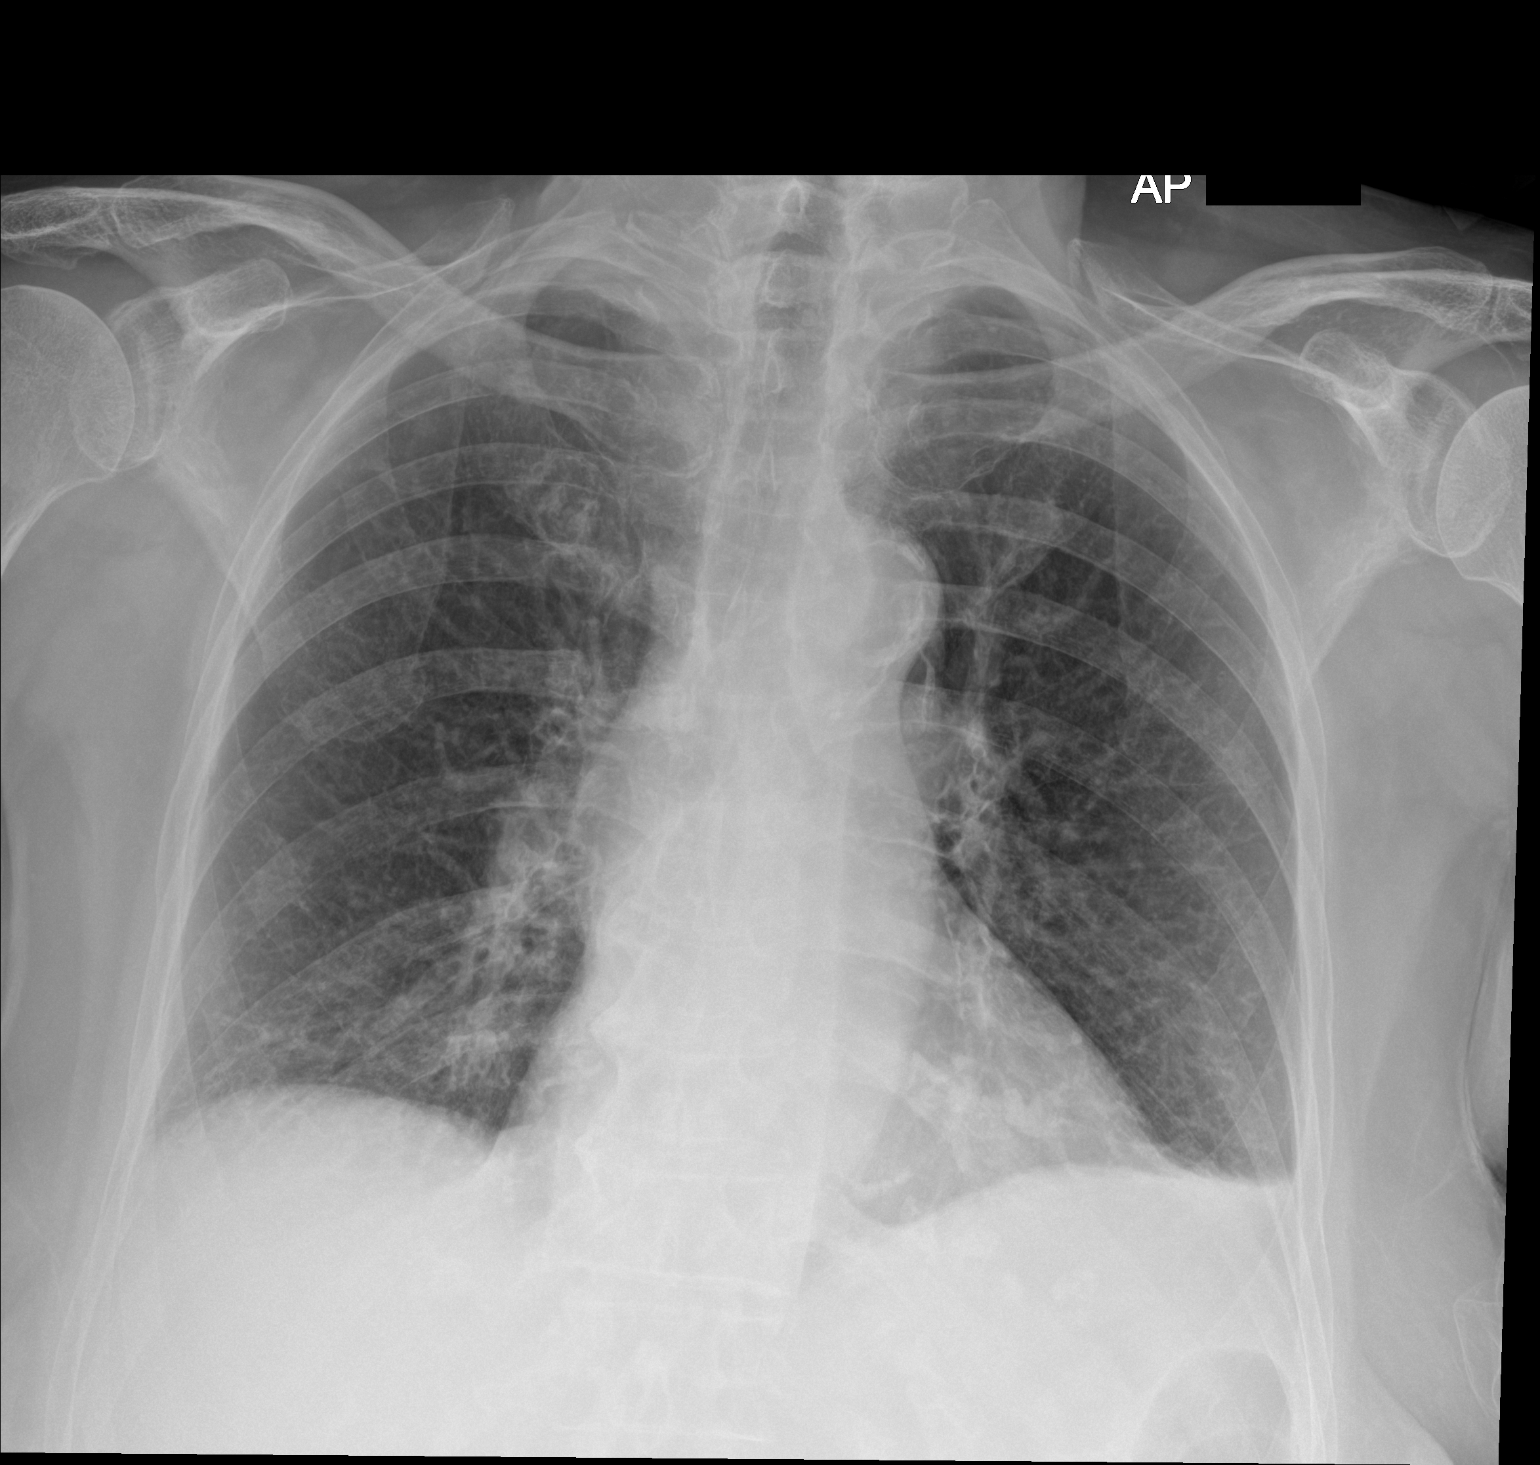

[1 of 1 positions shown; findings below may reference images not displayed]

FINDINGS: The heart size and mediastinal contours are within normal limits.
Both lungs are clear. The visualized skeletal structures are
unremarkable.
IMPRESSION: No active disease.

## 2019-05-30 IMAGING — CT CT ABD-PELV W/ CM
2 of 5 series · 15 of 46 positions shown, 17 images · IV contrast (APPLIED)
Comparison: CT [DATE]

CLINICAL DATA: Nausea vomiting and abdominal pain

EXAM:
CT ABDOMEN AND PELVIS WITH CONTRAST
TECHNIQUE: Multidetector CT imaging of the abdomen and pelvis was performed
using the standard protocol following bolus administration of
intravenous contrast.
CONTRAST:  70mL OMNIPAQUE IOHEXOL 300 MG/ML  SOLN

[Series 2: routine abd/pel with · axial · 0.70mm/px · z∈[-925,-495]mm · 12 of 96 slices shown, 14 images]
[im 5/96  soft-tissue]
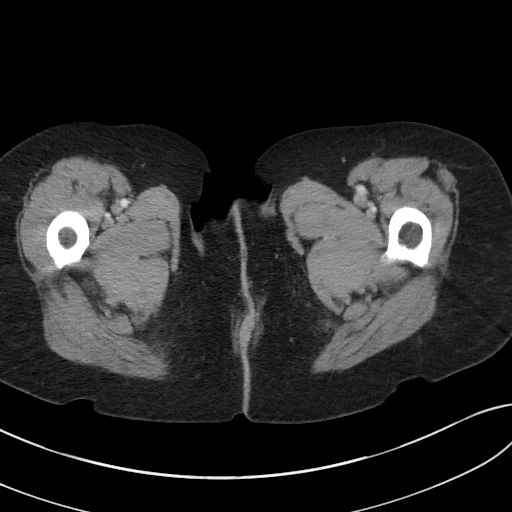
[im 5/96  bone]
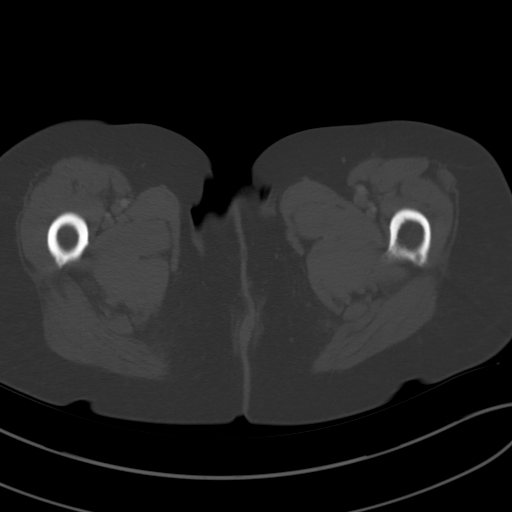
[im 15/96  soft-tissue]
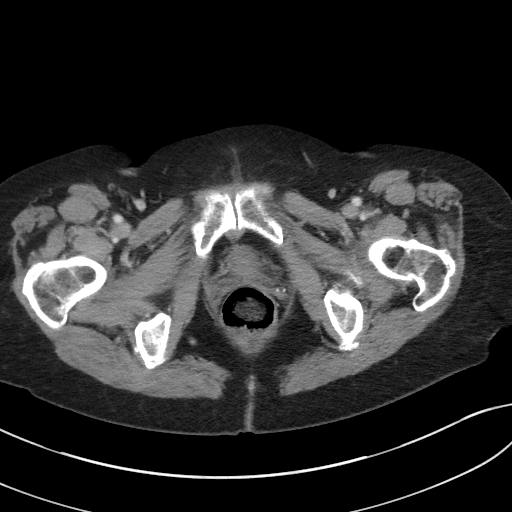
[im 20/96  soft-tissue]
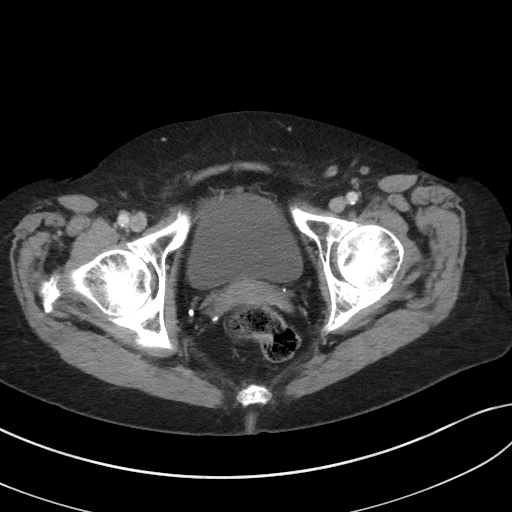
[im 29/96  soft-tissue]
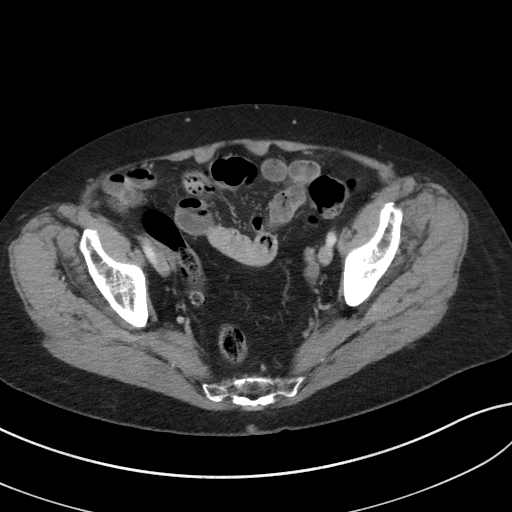
[im 39/96  soft-tissue]
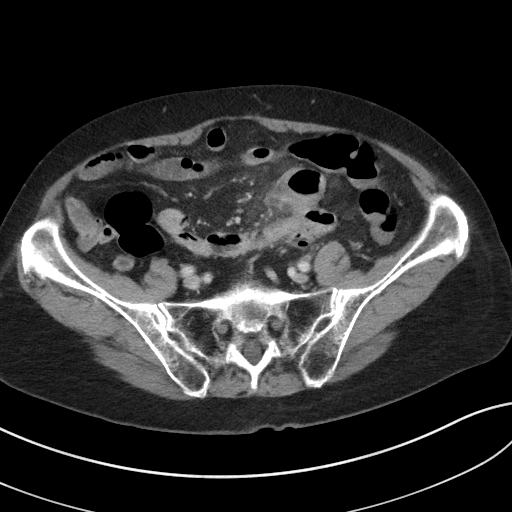
[im 43/96  soft-tissue]
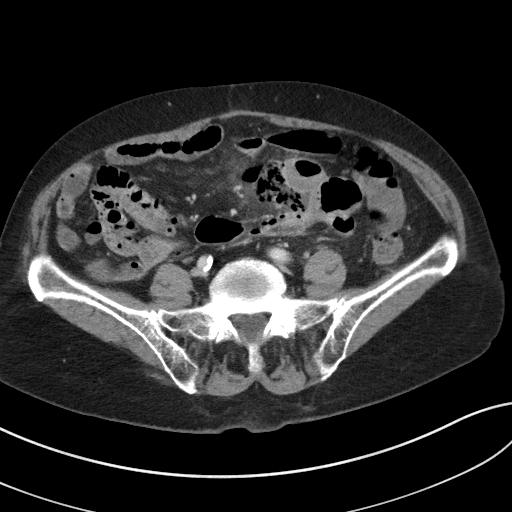
[im 53/96  soft-tissue]
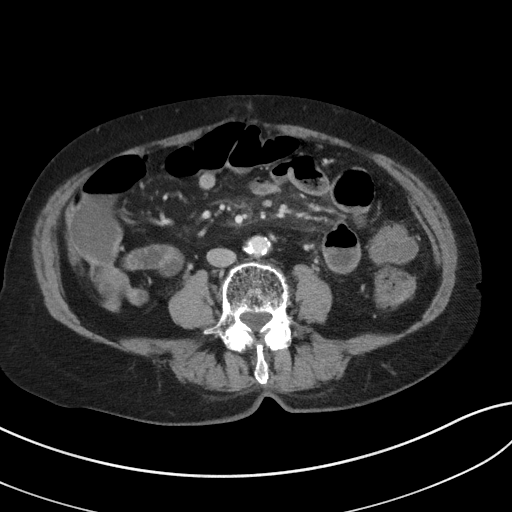
[im 58/96  soft-tissue]
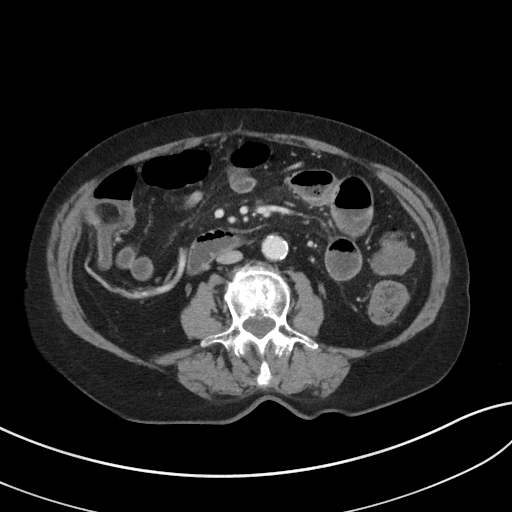
[im 67/96  soft-tissue]
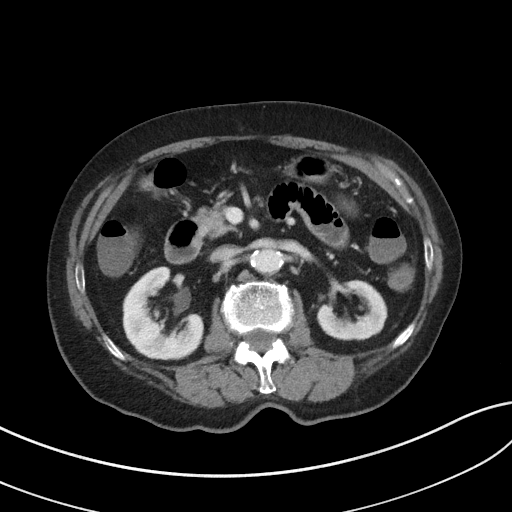
[im 67/96  bone]
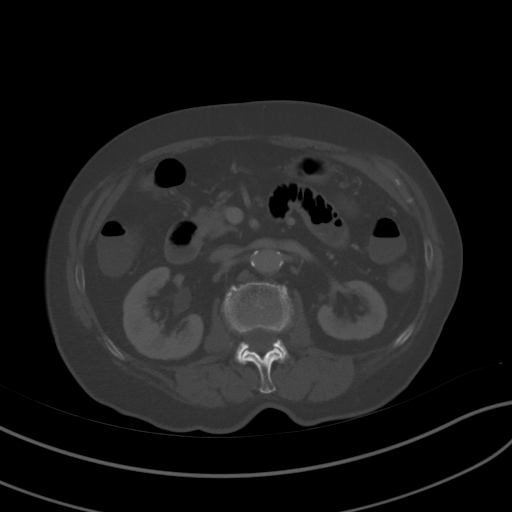
[im 77/96  soft-tissue]
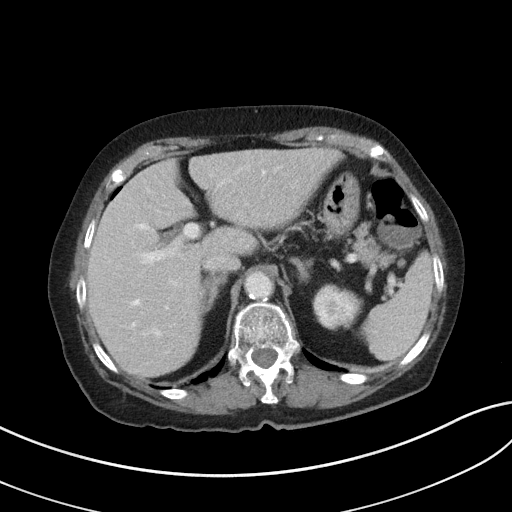
[im 81/96  soft-tissue]
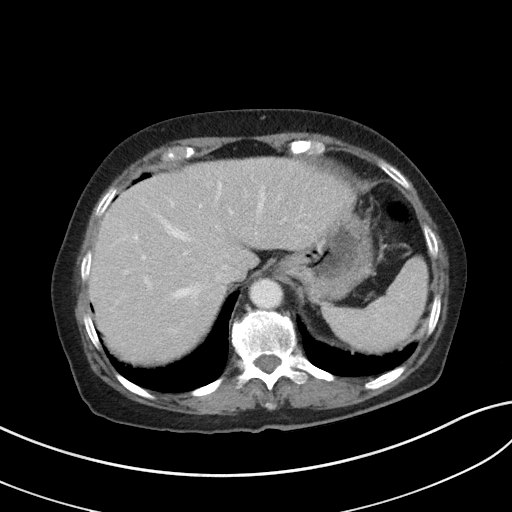
[im 91/96  soft-tissue]
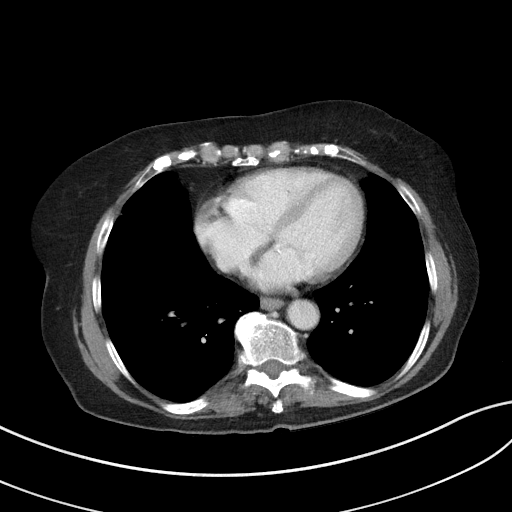

[Series 5: coronal st · coronal · 0.71mm/px · 3 of 69 slices shown]
[im 23/69  soft-tissue]
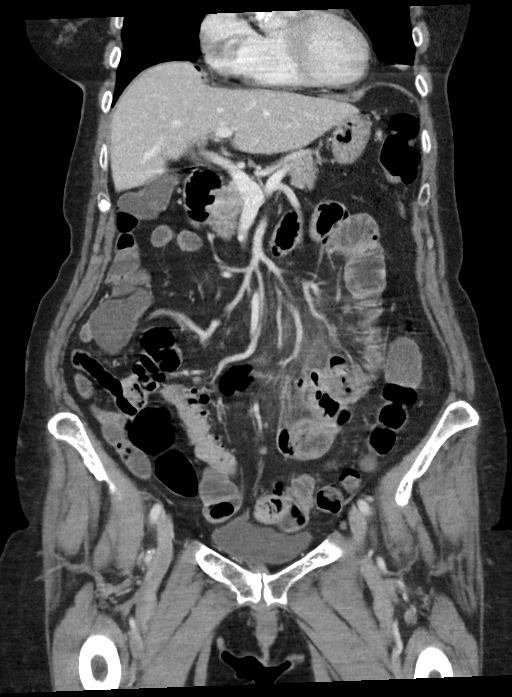
[im 31/69  soft-tissue]
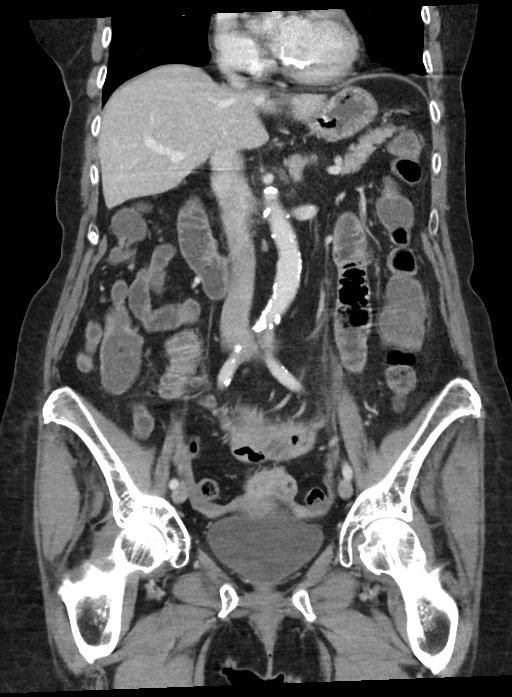
[im 38/69  soft-tissue]
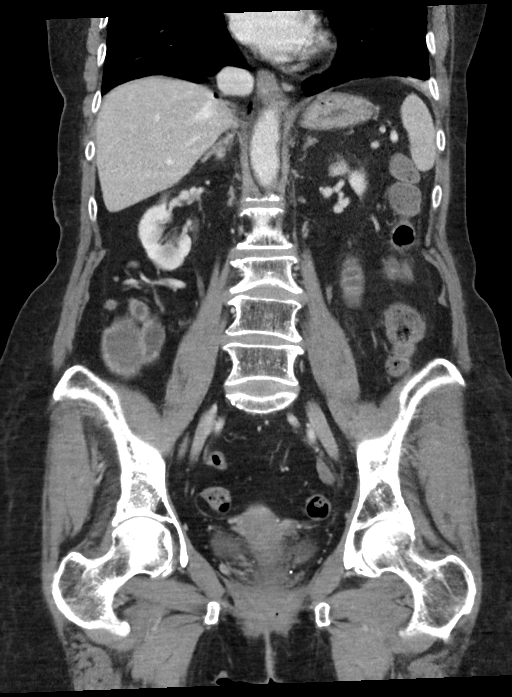

[15 of 46 positions shown; findings below may reference images not displayed]

FINDINGS: Lower chest: Lung bases demonstrate no acute consolidation or
pleural effusion. The heart size is normal.

Hepatobiliary: No focal liver abnormality is seen. Status post
cholecystectomy. No biliary dilatation.

Pancreas: Unremarkable. No pancreatic ductal dilatation or
surrounding inflammatory changes.

Spleen: Normal in size without focal abnormality.

Adrenals/Urinary Tract: Adrenal glands are normal. No
hydronephrosis. Subcentimeter hypodensities within the kidneys, too
small to further characterize. The bladder is normal

Stomach/Bowel: Stomach nonenlarged. Fluid-filled borderline to
slightly enlarged proximal to mid small bowel. Mild wall thickening
of left mid abdominal small bowel with moderate inflammatory change.
Extraluminal gas collection adjacent to the slightly thickened small
bowel, series 2, image number 52 through 58. Negative for colon wall
thickening. Sigmoid colon diverticula.

Vascular/Lymphatic: Moderate aortic atherosclerosis. No aneurysm. No
significantly enlarged lymph nodes

Reproductive: Uterus and bilateral adnexa are unremarkable.

Other: Small foci of pneumoperitoneum adjacent to the liver. Fat
containing supraumbilical ventral and umbilical hernias containing
fat and small amount of free air. Extraluminal gas collection within
the mid abdominal region to the left of midline consistent with
viscus perforation. Edema within the mesentery at the site of
perforation.

Musculoskeletal: Chronic inferior endplate deformity at L3.
IMPRESSION: 1. Pneumoperitoneum consistent with hollow viscus perforation.
Suspected source is the proximal to mid small bowel within the mid
abdomen slightly to the left of midline where there is focal
extraluminal gas collection adjacent to a fluid-filled slightly
dilated and thickened loop of small bowel. Considerable inflammatory
change in the mesentery. Differential considerations include
perforated small bowel diverticulitis (a few other small bowel
diverticular seen in the region), and ischemia. MITUL due
to relatively proximal location of the finding.
2. Umbilical and supraumbilical ventral hernias containing fat and
small amounts of pneumoperitoneum

Critical Value/emergent results were called by telephone at the time
of interpretation on [DATE] at [DATE] to MITUL ,
who verbally acknowledged these results.

## 2019-05-30 SURGERY — LAPAROTOMY, EXPLORATORY
Anesthesia: General

## 2019-05-30 MED ORDER — FENTANYL CITRATE (PF) 100 MCG/2ML IJ SOLN
INTRAMUSCULAR | Status: DC | PRN
  Administered 2019-05-30 (×2): 50 ug via INTRAVENOUS
  Administered 2019-05-30: 25 ug via INTRAVENOUS

## 2019-05-30 MED ORDER — SUCCINYLCHOLINE CHLORIDE 20 MG/ML IJ SOLN
INTRAMUSCULAR | Status: DC | PRN
  Administered 2019-05-30: 80 mg via INTRAVENOUS

## 2019-05-30 MED ORDER — INFLUENZA VAC A&B SA ADJ QUAD 0.5 ML IM PRSY
0.5000 mL | PREFILLED_SYRINGE | INTRAMUSCULAR | Status: AC
  Administered 2019-06-03: 0.5 mL via INTRAMUSCULAR
  Filled 2019-05-30 (×2): qty 0.5

## 2019-05-30 MED ORDER — ACETAMINOPHEN 500 MG PO TABS
1000.0000 mg | ORAL_TABLET | Freq: Four times a day (QID) | ORAL | Status: DC
  Administered 2019-05-30 – 2019-06-03 (×15): 1000 mg via ORAL
  Filled 2019-05-30 (×17): qty 2

## 2019-05-30 MED ORDER — SODIUM CHLORIDE 0.9 % IV SOLN
2.0000 g | Freq: Three times a day (TID) | INTRAVENOUS | Status: DC
  Filled 2019-05-30 (×2): qty 2

## 2019-05-30 MED ORDER — ONDANSETRON HCL 4 MG/2ML IJ SOLN
INTRAMUSCULAR | Status: DC | PRN
  Administered 2019-05-30: 4 mg via INTRAVENOUS

## 2019-05-30 MED ORDER — SODIUM CHLORIDE 0.9 % IV SOLN
2.0000 g | Freq: Once | INTRAVENOUS | Status: AC
  Administered 2019-05-30: 2 g via INTRAVENOUS
  Filled 2019-05-30: qty 2

## 2019-05-30 MED ORDER — ONDANSETRON HCL 4 MG/2ML IJ SOLN: 4.0000 mg | Freq: Once | INTRAMUSCULAR | Status: DC | PRN

## 2019-05-30 MED ORDER — FENTANYL CITRATE (PF) 100 MCG/2ML IJ SOLN: 25.0000 ug | INTRAMUSCULAR | Status: DC | PRN

## 2019-05-30 MED ORDER — SUCCINYLCHOLINE CHLORIDE 20 MG/ML IJ SOLN
INTRAMUSCULAR | Status: AC
  Filled 2019-05-30: qty 1

## 2019-05-30 MED ORDER — PANTOPRAZOLE SODIUM 40 MG IV SOLR
40.0000 mg | INTRAVENOUS | Status: DC
  Administered 2019-05-30 – 2019-06-03 (×5): 40 mg via INTRAVENOUS
  Filled 2019-05-30 (×5): qty 40

## 2019-05-30 MED ORDER — ROCURONIUM BROMIDE 100 MG/10ML IV SOLN
INTRAVENOUS | Status: DC | PRN
  Administered 2019-05-30: 20 mg via INTRAVENOUS
  Administered 2019-05-30: 30 mg via INTRAVENOUS

## 2019-05-30 MED ORDER — PHENYLEPHRINE HCL (PRESSORS) 10 MG/ML IV SOLN
INTRAVENOUS | Status: DC | PRN
  Administered 2019-05-30: 100 ug via INTRAVENOUS

## 2019-05-30 MED ORDER — ONDANSETRON HCL 4 MG/2ML IJ SOLN
INTRAMUSCULAR | Status: AC
  Filled 2019-05-30: qty 2

## 2019-05-30 MED ORDER — FENTANYL CITRATE (PF) 100 MCG/2ML IJ SOLN
INTRAMUSCULAR | Status: AC
  Filled 2019-05-30: qty 2

## 2019-05-30 MED ORDER — PROCHLORPERAZINE MALEATE 10 MG PO TABS
10.0000 mg | ORAL_TABLET | Freq: Four times a day (QID) | ORAL | Status: DC | PRN
  Filled 2019-05-30: qty 1

## 2019-05-30 MED ORDER — OXYCODONE HCL 5 MG PO TABS
5.0000 mg | ORAL_TABLET | ORAL | Status: DC | PRN
  Administered 2019-05-30 – 2019-05-31 (×3): 5 mg via ORAL
  Filled 2019-05-30 (×3): qty 1

## 2019-05-30 MED ORDER — ONDANSETRON 4 MG PO TBDP: 4.0000 mg | ORAL_TABLET | Freq: Four times a day (QID) | ORAL | Status: DC | PRN

## 2019-05-30 MED ORDER — MIDAZOLAM HCL 5 MG/5ML IJ SOLN
INTRAMUSCULAR | Status: DC | PRN
  Administered 2019-05-30: 2 mg via INTRAVENOUS

## 2019-05-30 MED ORDER — ROCURONIUM BROMIDE 50 MG/5ML IV SOLN
INTRAVENOUS | Status: AC
  Filled 2019-05-30: qty 1

## 2019-05-30 MED ORDER — METRONIDAZOLE IN NACL 5-0.79 MG/ML-% IV SOLN
500.0000 mg | Freq: Three times a day (TID) | INTRAVENOUS | Status: DC
  Filled 2019-05-30 (×2): qty 100

## 2019-05-30 MED ORDER — SODIUM CHLORIDE (PF) 0.9 % IJ SOLN
INTRAMUSCULAR | Status: AC
  Filled 2019-05-30: qty 50

## 2019-05-30 MED ORDER — SUGAMMADEX SODIUM 200 MG/2ML IV SOLN
INTRAVENOUS | Status: AC
  Filled 2019-05-30: qty 2

## 2019-05-30 MED ORDER — METRONIDAZOLE IN NACL 5-0.79 MG/ML-% IV SOLN
500.0000 mg | Freq: Once | INTRAVENOUS | Status: AC
  Administered 2019-05-30: 500 mg via INTRAVENOUS
  Filled 2019-05-30: qty 100

## 2019-05-30 MED ORDER — SUGAMMADEX SODIUM 200 MG/2ML IV SOLN
INTRAVENOUS | Status: DC | PRN
  Administered 2019-05-30: 200 mg via INTRAVENOUS

## 2019-05-30 MED ORDER — PROPOFOL 10 MG/ML IV BOLUS
INTRAVENOUS | Status: AC
  Filled 2019-05-30: qty 20

## 2019-05-30 MED ORDER — PROCHLORPERAZINE EDISYLATE 10 MG/2ML IJ SOLN
5.0000 mg | Freq: Four times a day (QID) | INTRAMUSCULAR | Status: DC | PRN
  Filled 2019-05-30: qty 2

## 2019-05-30 MED ORDER — LIDOCAINE HCL (CARDIAC) PF 100 MG/5ML IV SOSY
PREFILLED_SYRINGE | INTRAVENOUS | Status: DC | PRN
  Administered 2019-05-30: 100 mg via INTRAVENOUS

## 2019-05-30 MED ORDER — DEXAMETHASONE SODIUM PHOSPHATE 10 MG/ML IJ SOLN
INTRAMUSCULAR | Status: DC | PRN
  Administered 2019-05-30: 5 mg via INTRAVENOUS

## 2019-05-30 MED ORDER — SODIUM CHLORIDE 0.9 % IV BOLUS
500.0000 mL | Freq: Once | INTRAVENOUS | Status: AC
  Administered 2019-05-30: 500 mL via INTRAVENOUS

## 2019-05-30 MED ORDER — IOHEXOL 300 MG/ML  SOLN
75.0000 mL | Freq: Once | INTRAMUSCULAR | Status: AC | PRN
  Administered 2019-05-30: 70 mL via INTRAVENOUS

## 2019-05-30 MED ORDER — CHLORHEXIDINE GLUCONATE CLOTH 2 % EX PADS
6.0000 | MEDICATED_PAD | Freq: Every day | CUTANEOUS | Status: DC
  Administered 2019-05-30: 6 via TOPICAL

## 2019-05-30 MED ORDER — METRONIDAZOLE IN NACL 5-0.79 MG/ML-% IV SOLN
500.0000 mg | Freq: Three times a day (TID) | INTRAVENOUS | Status: DC
  Administered 2019-05-30 – 2019-06-03 (×12): 500 mg via INTRAVENOUS
  Filled 2019-05-30 (×16): qty 100

## 2019-05-30 MED ORDER — SODIUM CHLORIDE 0.9 % IV SOLN
INTRAVENOUS | Status: DC
  Administered 2019-05-30 – 2019-06-01 (×4): via INTRAVENOUS

## 2019-05-30 MED ORDER — MIDAZOLAM HCL 2 MG/2ML IJ SOLN
INTRAMUSCULAR | Status: AC
  Filled 2019-05-30: qty 2

## 2019-05-30 MED ORDER — ONDANSETRON HCL 4 MG/2ML IJ SOLN: 4.0000 mg | Freq: Four times a day (QID) | INTRAMUSCULAR | Status: DC | PRN

## 2019-05-30 MED ORDER — BUPIVACAINE-EPINEPHRINE (PF) 0.5% -1:200000 IJ SOLN
INTRAMUSCULAR | Status: AC
  Filled 2019-05-30: qty 30

## 2019-05-30 MED ORDER — PROPOFOL 10 MG/ML IV BOLUS
INTRAVENOUS | Status: DC | PRN
  Administered 2019-05-30: 100 mg via INTRAVENOUS

## 2019-05-30 MED ORDER — BUPIVACAINE-EPINEPHRINE 0.5% -1:200000 IJ SOLN
INTRAMUSCULAR | Status: DC | PRN
  Administered 2019-05-30: 30 mL

## 2019-05-30 MED ORDER — LACTATED RINGERS IV SOLN
INTRAVENOUS | Status: DC | PRN
  Administered 2019-05-30: 06:00:00 via INTRAVENOUS

## 2019-05-30 MED ORDER — HYDRALAZINE HCL 20 MG/ML IJ SOLN: 10.0000 mg | INTRAMUSCULAR | Status: DC | PRN

## 2019-05-30 MED ORDER — SODIUM CHLORIDE 0.9 % IV SOLN
2.0000 g | INTRAVENOUS | Status: DC
  Administered 2019-05-31 – 2019-06-03 (×4): 2 g via INTRAVENOUS
  Filled 2019-05-30 (×4): qty 2

## 2019-05-30 MED ORDER — MORPHINE SULFATE (PF) 4 MG/ML IV SOLN: 2.0000 mg | INTRAVENOUS | Status: DC | PRN

## 2019-05-30 MED ORDER — HEPARIN SODIUM (PORCINE) 5000 UNIT/ML IJ SOLN
5000.0000 [IU] | Freq: Three times a day (TID) | INTRAMUSCULAR | Status: DC
  Administered 2019-05-30 – 2019-06-03 (×12): 5000 [IU] via SUBCUTANEOUS
  Filled 2019-05-30 (×12): qty 1

## 2019-05-30 MED ORDER — PHENYLEPHRINE HCL (PRESSORS) 10 MG/ML IV SOLN
INTRAVENOUS | Status: AC
  Filled 2019-05-30: qty 1

## 2019-05-30 MED ORDER — LIDOCAINE HCL (PF) 2 % IJ SOLN
INTRAMUSCULAR | Status: AC
  Filled 2019-05-30: qty 10

## 2019-05-30 MED ORDER — BUPIVACAINE LIPOSOME 1.3 % IJ SUSP
INTRAMUSCULAR | Status: AC
  Filled 2019-05-30: qty 20

## 2019-05-30 MED ORDER — SODIUM CHLORIDE 0.9 % IV SOLN
INTRAVENOUS | Status: DC | PRN
  Administered 2019-05-30: 07:00:00 70 mL

## 2019-05-30 SURGICAL SUPPLY — 46 items
APPLIER CLIP 11 MED OPEN (CLIP) ×3
APPLIER CLIP 13 LRG OPEN (CLIP) ×3
BARRIER ADH SEPRAFILM 3INX5IN (MISCELLANEOUS) ×3 IMPLANT
BLADE CLIPPER SURG (BLADE) ×3 IMPLANT
BLADE SURG SZ10 CARB STEEL (BLADE) ×3 IMPLANT
CANISTER SUCT 3000ML PPV (MISCELLANEOUS) ×3 IMPLANT
CHLORAPREP W/TINT 26 (MISCELLANEOUS) ×3 IMPLANT
CLIP APPLIE 11 MED OPEN (CLIP) ×1 IMPLANT
CLIP APPLIE 13 LRG OPEN (CLIP) ×1 IMPLANT
COVER BACK TABLE REUSABLE LG (DRAPES) ×3 IMPLANT
COVER WAND RF STERILE (DRAPES) IMPLANT
DRAPE LAPAROTOMY 100X77 ABD (DRAPES) ×3 IMPLANT
DRSG OPSITE POSTOP 4X14 (GAUZE/BANDAGES/DRESSINGS) ×3 IMPLANT
DRSG TEGADERM 2-3/8X2-3/4 SM (GAUZE/BANDAGES/DRESSINGS) ×6 IMPLANT
DRSG TELFA 3X8 NADH (GAUZE/BANDAGES/DRESSINGS) ×3 IMPLANT
ELECT BLADE 6.5 EXT (BLADE) ×3 IMPLANT
ELECT REM PT RETURN 9FT ADLT (ELECTROSURGICAL) ×3
ELECTRODE REM PT RTRN 9FT ADLT (ELECTROSURGICAL) ×1 IMPLANT
GAUZE SPONGE 4X4 12PLY STRL (GAUZE/BANDAGES/DRESSINGS) ×6 IMPLANT
GLOVE BIO SURGEON STRL SZ7 (GLOVE) ×3 IMPLANT
GOWN STRL REUS W/ TWL LRG LVL3 (GOWN DISPOSABLE) ×2 IMPLANT
GOWN STRL REUS W/TWL LRG LVL3 (GOWN DISPOSABLE) ×4
HANDLE SUCTION POOLE (INSTRUMENTS) ×1 IMPLANT
HANDLE YANKAUER SUCT BULB TIP (MISCELLANEOUS) ×3 IMPLANT
LIGASURE IMPACT 36 18CM CVD LR (INSTRUMENTS) ×3 IMPLANT
NEEDLE HYPO 22GX1.5 SAFETY (NEEDLE) ×6 IMPLANT
NEEDLE HYPO 25X1 1.5 SAFETY (NEEDLE) ×3 IMPLANT
PACK BASIN MAJOR ARMC (MISCELLANEOUS) ×3 IMPLANT
SEPRAFILM MEMBRANE 5X6 (MISCELLANEOUS) ×3 IMPLANT
SPONGE LAP 18X18 RF (DISPOSABLE) ×3 IMPLANT
SPONGE LAP 18X36 RFD (DISPOSABLE) ×3 IMPLANT
STAPLER PROXIMATE 75MM BLUE (STAPLE) ×3 IMPLANT
STAPLER SKIN PROX 35W (STAPLE) ×3 IMPLANT
SUCTION POOLE HANDLE (INSTRUMENTS) ×3
SUT PDS AB 0 CT1 27 (SUTURE) ×9 IMPLANT
SUT SILK 2 0 (SUTURE) ×2
SUT SILK 2 0 SH CR/8 (SUTURE) ×3 IMPLANT
SUT SILK 2 0SH CR/8 30 (SUTURE) ×3 IMPLANT
SUT SILK 2-0 18XBRD TIE 12 (SUTURE) ×1 IMPLANT
SUT VIC AB 0 CT1 36 (SUTURE) ×6 IMPLANT
SUT VIC AB 2-0 SH 27 (SUTURE) ×4
SUT VIC AB 2-0 SH 27XBRD (SUTURE) ×2 IMPLANT
SYR 30ML LL (SYRINGE) ×6 IMPLANT
SYR 3ML LL SCALE MARK (SYRINGE) ×3 IMPLANT
TAPE MICROFOAM 4IN (TAPE) ×3 IMPLANT
TRAY FOLEY MTR SLVR 16FR STAT (SET/KITS/TRAYS/PACK) ×3 IMPLANT

## 2019-05-30 NOTE — Anesthesia Preprocedure Evaluation (Signed)
Anesthesia Evaluation  Patient identified by MRN, date of birth, ID band Patient awake    Reviewed: Allergy & Precautions, NPO status , Patient's Chart, lab work & pertinent test results, reviewed documented beta blocker date and time   Airway Mallampati: II  TM Distance: >3 FB     Dental  (+) Chipped   Pulmonary           Cardiovascular      Neuro/Psych    GI/Hepatic   Endo/Other  Hypothyroidism   Renal/GU      Musculoskeletal   Abdominal   Peds  Hematology   Anesthesia Other Findings Lymphoma. EKG shows PACs.  Reproductive/Obstetrics                             Anesthesia Physical Anesthesia Plan  ASA: III  Anesthesia Plan: General   Post-op Pain Management:    Induction: Intravenous and Rapid sequence  PONV Risk Score and Plan:   Airway Management Planned: Oral ETT  Additional Equipment:   Intra-op Plan:   Post-operative Plan:   Informed Consent: I have reviewed the patients History and Physical, chart, labs and discussed the procedure including the risks, benefits and alternatives for the proposed anesthesia with the patient or authorized representative who has indicated his/her understanding and acceptance.       Plan Discussed with: CRNA  Anesthesia Plan Comments:         Anesthesia Quick Evaluation

## 2019-05-30 NOTE — H&P (Signed)
Patient ID: Jeanette Newton, female   DOB: Jul 26, 1928, 85 y.o.   MRN: [id number]  HPI Jeanette Newton is a 85 y.o. female seen in consultation at the request of Dr. Karma Greaser.  She reports a 2-day history of abdominal pain nausea and vomiting.  The daughter reports being confused.  She reports that the pain is moderate in intensity, intermittent worsening with certain movement.  Pain is also sharp.  No fevers,  no chills, she has been taking good precautions against COVID-19 and being isolated.  She is very functional and is able to perform more than 4 METS of activity without any shortness of breath or chest pain.  She walks without a cane or walker.  The daughter is by the bedside.  She did get a CT scan of the abdomen pelvis that I have personally reviewed showing evidence of free air with thickening of the small bowel in the midportion.  She does have a ventral hernia as well.  She does have diverticulosis without diverticulitis.  White count is 13.8 hemoglobin is 13 platelets 200,000.  CMP shows a creatinine of 1.28 that is close to baseline.  Total bilirubin of 2.7 and a sodium of 134.  Rest of the labs are normal except a slight bump on her LFTs. He has had a history of cholecystectomy by Dr. Bary Castilla and a previous laparotomy by Dr. Pat Patrick  HPI  Past Medical History:  Diagnosis Date  . Cancer [facility]) 2005-2006   lymphoma  . Hypothyroidism   . Status post chemoradiation    lymphoma  . Status post radiation therapy   . Thyroid disease     Past Surgical History:  Procedure Laterality Date  . ABDOMINAL EXPLORATION SURGERY  2005-2006   Dr Pat Patrick  . CHOLECYSTECTOMY N/A 05/25/2016   Procedure: LAPAROSCOPIC CHOLECYSTECTOMY WITH INTRAOPERATIVE CHOLANGIOGRAM;  Surgeon: Robert Bellow, MD;  Location: [facility] ORS;  Service: General;  Laterality: N/A;    Family History  Problem Relation Age of Onset  . Breast cancer Neg Hx     Social History Social History   Tobacco Use  . Smoking status: Never  Smoker  . Smokeless tobacco: Never Used  Substance Use Topics  . Alcohol use: No  . Drug use: No    Allergies  Allergen Reactions  . Clarithromycin Nausea Only    Biaxin  . Clindamycin Nausea Only  . Ciprofloxacin Swelling and Rash    Pt states she can take this medication  . Doxycycline Hyclate Rash  . Etodolac Rash  . Levaquin [Levofloxacin In D5w] Rash  . Penicillins Rash  . Propranolol Itching and Rash    Current Facility-Administered Medications  Medication Dose Route Frequency Provider Last Rate Last Dose  . ceFEPIme (MAXIPIME) 2 g in sodium chloride 0.9 % 100 mL IVPB  2 g Intravenous Once Hinda Kehr, MD       And  . metroNIDAZOLE (FLAGYL) IVPB 500 mg  500 mg Intravenous Once Hinda Kehr, MD      . sodium chloride 0.9 % bolus 500 mL  500 mL Intravenous Once Hinda Kehr, MD       Current Outpatient Medications  Medication Sig Dispense Refill  . aspirin EC 81 MG tablet Take 81 mg by mouth daily.    . Calcium-Vitamin D 600-200 MG-UNIT tablet Take 1 tablet by mouth 2 (two) times daily.     . Cholecalciferol (VITAMIN D3) 50 MCG (2000 UT) TABS Take 2,000 Units by mouth daily.    Marland Kitchen levothyroxine (SYNTHROID,  LEVOTHROID) 75 MCG tablet Take 75 mcg by mouth daily before breakfast.     . Omega-3 Fatty Acids (FISH OIL PO) Take 1 capsule by mouth 2 (two) times daily.        Review of Systems Full ROS  was asked and was negative except for the information on the HPI  Physical Exam Blood pressure (!) 125/57, pulse 80, temperature 98.6 F (37 C), temperature source Oral, resp. rate 16, height 5\' 2"  (1.575 m), weight 59 kg, SpO2 96 %. CONSTITUTIONAL: Elderly female non toxic, tachycardic ( HR 100) when I examined her EYES: Pupils are equal, round, and reactive to light, Sclera are non-icteric. EARS, NOSE, MOUTH AND THROAT: The oropharynx is clear. The oral mucosa is pink and moist. Hearing is intact to voice. LYMPH NODES:  Lymph nodes in the neck are normal. RESPIRATORY:   Lungs are clear. There is normal respiratory effort, with equal breath sounds bilaterally, and without pathologic use of accessory muscles. CARDIOVASCULAR: Heart is regular without murmurs, gallops, or rubs. GI: The abdomen is  soft, there is diffuse tenderness and positive rebound consistent with overt peritoneal signs.  Reducible ventral hernia.  There is no hepatosplenomegaly. There are normal bowel sounds in all quadrants. GU: Rectal deferred.   MUSCULOSKELETAL: Normal muscle strength and tone. No cyanosis or edema.   SKIN: Turgor is good and there are no pathologic skin lesions or ulcers. NEUROLOGIC: Motor and sensation is grossly normal. Cranial nerves are grossly intact. PSYCH:  Oriented to person, place and time. Affect is normal.  Data Reviewed  I have personally reviewed the patient's imaging, laboratory findings and medical records.    Assessment/Plan 85 year old female with acute abdomen and peritoneal signs consistent with perforated viscus and confirmed by CT scan.  Discussed with the patient and the family about the situation.  It is my recommendation that we will proceed emergently to the operating room for exploratory laparotomy and possible bowel resection.  Procedure discussed with the patient and the daughter in detail.  Risk benefits and possible complications including but not limited to: Bleeding, infection, injury to adjacent structures, need for ostomy need for open abdomen, need for ICU care.  They both understand and they wish to proceed.  We will start fluid resuscitation, IV antibiotics and post her emergently to the OR.  Given the gravity of the situation I do  believe that we should not wait on a COVID-19 testing and we should proceed emergently   Caroleen Hamman, MD [place] Surgeon 05/30/2019, 3:55 AM

## 2019-05-30 NOTE — Progress Notes (Signed)
PHARMACY NOTE:  ANTIMICROBIAL RENAL DOSAGE ADJUSTMENT  Current antimicrobial regimen includes a mismatch between antimicrobial dosage and estimated renal function.  As per policy approved by the Pharmacy & Therapeutics and Medical Executive Committees, the antimicrobial dosage will be adjusted accordingly.  Current antimicrobial dosage:  Cefepime 2g IV Q8hr.   Indication: intra-abdominal infection; s/p exploratory laparotomy and small bowel resection.   Renal Function:  Estimated Creatinine Clearance: 27.7 mL/min (A) (by C-G formula based on SCr of 1.09 mg/dL (H)).    Antimicrobial dosage has been changed to:  Cefepime 2g IV Q24hr.   Thank you for allowing pharmacy to be a part of this patient's care.  Simpson,Michael L, [facility] 05/30/2019 12:36 PM

## 2019-05-30 NOTE — Addendum Note (Signed)
Addendum  created 05/30/19 1212 by Adalberto Ill, CRNA   Intraprocedure Event edited, Intraprocedure Staff edited

## 2019-05-30 NOTE — Transfer of Care (Signed)
Immediate Anesthesia Transfer of Care Note  Patient: Jeanette Newton  Procedure(s) Performed: EXPLORATORY LAPAROTOMYand small bowel resection (N/A )  Patient Location: PACU  Anesthesia Type:General  Level of Consciousness: awake, alert , oriented and patient cooperative  Airway & Oxygen Therapy: Patient Spontanous Breathing and Patient connected to nasal cannula oxygen  Post-op Assessment: Report given to RN and Post -op Vital signs reviewed and stable  Post vital signs: Reviewed and stable  Last Vitals:  Vitals Value Taken Time  BP 137/90 05/30/19 0751  Temp 36.9 C 05/30/19 0751  Pulse 64 05/30/19 0756  Resp 18 05/30/19 0756  SpO2 93 % 05/30/19 0756  Vitals shown include unvalidated device data.  Last Pain:  Vitals:   05/30/19 0751  TempSrc:   PainSc: Asleep         Complications: No apparent anesthesia complications

## 2019-05-30 NOTE — Anesthesia Procedure Notes (Signed)
Procedure Name: Intubation Date/Time: 05/30/2019 5:44 AM Performed by: Chanetta Marshall, CRNA Pre-anesthesia Checklist: Patient identified, Emergency Drugs available, Suction available and Patient being monitored Patient Re-evaluated:Patient Re-evaluated prior to induction Oxygen Delivery Method: Circle system utilized Preoxygenation: Pre-oxygenation with 100% oxygen Induction Type: IV induction Laryngoscope Size: McGraph and 3 Grade View: Grade I Tube type: Oral Number of attempts: 1 Airway Equipment and Method: Stylet and Video-laryngoscopy Placement Confirmation: ETT inserted through vocal cords under direct vision,  positive ETCO2,  breath sounds checked- equal and bilateral and CO2 detector Secured at: 20 cm Tube secured with: Tape Dental Injury: Teeth and Oropharynx as per pre-operative assessment

## 2019-05-30 NOTE — Progress Notes (Signed)
Pt .attempted to walk but pt was in so much pain so administered pain medicine instead. Will attempt to walk tomorrow.

## 2019-05-30 NOTE — Op Note (Signed)
PROCEDURES: Laparotomy with repair of large bowel enterotomy Small bowel resection w stapled primary anastomosis  Pre-operative Diagnosis: Bowel perforation  Post-operative Diagnosis: same  Surgeon: [place]    Anesthesia: General endotracheal anesthesia  ASA Class: 3   Surgeon: Caroleen Hamman , MD FACS  Anesthesia: Gen. with endotracheal tube  Findings: Free air Hostile abdomen with bowel and severe adhesions plastered to the abdominal wall, it was impossible not to create an enterotomy due to the transverse colon being plastered to the abdominal wall Small bowel perforation from SB diverticulum mid jejunum 40 cms bowel resected   Estimated Blood Loss: 25cc               Specimens: small bowel       Complications: none              Condition: stable  Procedure Details  The patient was seen again in the Holding Room. The benefits, complications, treatment options, and expected outcomes were discussed with the patient. The risks of bleeding, infection, recurrence of symptoms, failure to resolve symptoms,  bowel injury, any of which could require further surgery were reviewed with the patient.   The patient was taken to Operating Room, identified as Jeanette Newton and the procedure verified.  A Time Out was held and the above information confirmed.  Prior to the induction of general anesthesia, antibiotic prophylaxis was administered. VTE prophylaxis was in place. General endotracheal anesthesia was then administered and tolerated well. After the induction, the abdomen was prepped with Chloraprep and draped in the sterile fashion. The patient was positioned in the supine position.   There is midline laparotomy was performed she did have previews laparotomy scar.  I was extremely careful when I visualized the fascia and with a knife we proceeded to open the abdomen.  The whole abdominal wall was plaster with dense adhesions of omentum to abdominal wall and also transverse  colon to the abdominal wall.  An area right in the middle incision with a plaster piece of transverse colon was attached to the abdominal wall.  I extremely careful lysing the adhesions but it was inevitable to create an enterotomy within the transverse colon due to her adhesive disease.  After a least 1 hour of extensive lysis of adhesions were finally able to gain good access and exposure to the abdominal cavity.  I assessed the large bowel enterotomy and it was small about a centimeter or so and I was able to close it with multiple interrupted 2 oh silks followed by Lembert sutures.  I also cover the enterotomy with a piece of omentum.  Patient was then turned to the bowel and there was significant inflammatory response with some seropurulent fluid there was an obvious site of perforation at the mid jejunum.  She had significant small bowel diverticulosis with an obvious obvious area of diverticulitis with perforation.  Even her inflammatory response I needed to resect the small bowel.  A 40 cm segment was resected due to severe adhesions and inflammatory response.  We created mesentery windows on the proximal and distal side and using a GIA stapler we divided the bowel proximally and distally.  A standard side-to-side functional end-to-end staple anastomosis was created with the 75 GIA stapler.  Mesenteric defect was closed with a running 3-0 Vicryl.  The rest of the abdomen did not have any other intra-abdominal pathology.  We tested our enterotomy repair as well as the small bowel anastomosis and there was no evidence of  any leak, narrowing or tension.  The abdomen  Was closed with  -0 PDS suture in a running fashion , incorporating the hernia defects along the fascial closure. The skin was closed with staples. Liposomal Marcaine was injected on all incision sites under direct visualization.. Needle and laparotomy count were correct and there were no immediate occasions  Caroleen Hamman, MD, FACS

## 2019-05-30 NOTE — Anesthesia Post-op Follow-up Note (Signed)
Anesthesia QCDR form completed.        

## 2019-05-30 NOTE — ED Provider Notes (Signed)
[facility] Emergency Department Provider Note  ____________________________________________   First MD Initiated Contact with Patient 05/30/19 0123     (approximate)  I have reviewed the triage vital signs and the nursing notes.   HISTORY  Chief Complaint Emesis and Altered Mental Status  Level 5 caveat:  history/ROS may be limited by acute/critical illness and/or delirium.  HPI Jeanette Newton is a 85 y.o. female with medical history as listed below but who is generally healthy and lives independently.  Her daughter is currently at the bedside.  The patient presents tonight because of 1 to 2 days of several episodes of vomiting, decreased oral intake, and not acting herself.  Her daughter describes it as confusion and says this is a new thing for her and the patient has no history of dementia.  The patient reportedly had 2 episodes of vomiting within the last 24 hours and has not had much appetite.  The daughter has been encouraging her to eat or drink anything but she is concerned she may be dehydrated.  The patient has some generalized  weakness and decreased activity as well.  She said that she is having some pain in her abdomen but it is generalized and not any one specific location and she reports it is mild and aching.  She denies fever/chills, sore throat, chest pain, cough, shortness of breath, and dysuria although she admitted she cannot remember if it is been burning when she urinates.  They have not had any contact with COVID-19 patients.        Past Medical History:  Diagnosis Date  . Cancer [facility]) 2005-2006   lymphoma  . Hypothyroidism   . Status post chemoradiation    lymphoma  . Status post radiation therapy   . Thyroid disease     Patient Active Problem List   Diagnosis Date Noted  . Calculus of gallbladder with acute cholecystitis without obstruction 05/15/2016    Past Surgical History:  Procedure Laterality Date  . ABDOMINAL  EXPLORATION SURGERY  2005-2006   Dr Pat Patrick  . CHOLECYSTECTOMY N/A 05/25/2016   Procedure: LAPAROSCOPIC CHOLECYSTECTOMY WITH INTRAOPERATIVE CHOLANGIOGRAM;  Surgeon: Robert Bellow, MD;  Location: [facility] ORS;  Service: General;  Laterality: N/A;    Prior to Admission medications   Medication Sig Start Date End Date Taking? Authorizing Provider  aspirin EC 81 MG tablet Take 81 mg by mouth daily.    [provider]  Calcium-Vitamin D 600-200 MG-UNIT tablet Take 1 tablet by mouth 2 (two) times daily.     [provider]  Cholecalciferol (VITAMIN D3) 50 MCG (2000 UT) TABS Take 2,000 Units by mouth daily.    [provider]  levothyroxine (SYNTHROID, LEVOTHROID) 75 MCG tablet Take 75 mcg by mouth daily before breakfast.  06/30/15   [provider]  Omega-3 Fatty Acids (FISH OIL PO) Take 1 capsule by mouth 2 (two) times daily.     [provider]    Allergies Clarithromycin, Clindamycin, Ciprofloxacin, Doxycycline hyclate, Etodolac, Levaquin [levofloxacin in d5w], Penicillins, and Propranolol  Family History  Problem Relation Age of Onset  . Breast cancer Neg Hx     Social History Social History   Tobacco Use  . Smoking status: Never Smoker  . Smokeless tobacco: Never Used  Substance Use Topics  . Alcohol use: No  . Drug use: No    Review of Systems Level 5 caveat:  history/ROS may be limited by acute/critical illness and/or delirium.  Constitutional: No fever/chills.  Generalized weakness and fatigue, poor oral intake. Eyes: No visual changes. ENT: No sore throat. Cardiovascular: Denies chest pain. Respiratory: Denies shortness of breath. Gastrointestinal: Generalized abdominal pain with some nausea and vomiting for the last 1 to 2 days.   Genitourinary: Negative for dysuria. Musculoskeletal: Negative for neck pain.  Negative for back pain. Integumentary: Negative for rash. Neurological: Negative for headaches, focal weakness or  numbness. Psychiatric:  New onset confusion.  ____________________________________________   PHYSICAL EXAM:  VITAL SIGNS: ED Triage Vitals  Enc Vitals Group     BP 05/29/19 2006 (!) 128/43     Pulse Rate 05/29/19 2006 60     Resp 05/29/19 2006 18     Temp 05/29/19 2006 98.6 F (37 C)     Temp Source 05/29/19 2006 Oral     SpO2 05/29/19 2006 96 %     Weight 05/29/19 2007 59 kg (130 lb)     Height 05/29/19 2007 1.575 m (5\' 2" )     Head Circumference --      Peak Flow --      Pain Score 05/29/19 2007 0     Pain Loc --      Pain Edu? --      Excl. in [place]? --     Constitutional: Alert and oriented but she does seem confused and is only minimally able to provide history.  Otherwise she is acting appropriate, laughing and joking with me, and is in no acute distress. Eyes: Conjunctivae are normal.  Head: Atraumatic. Nose: No congestion/rhinnorhea. Mouth/Throat: Mucous membranes are tacky. Neck: No stridor.  No meningeal signs.   Cardiovascular: Normal rate, regular rhythm. Good peripheral circulation. Grossly normal heart sounds. Respiratory: Normal respiratory effort.  No retractions. Gastrointestinal: Soft and nondistended.  Mild diffuse tenderness throughout the abdomen.  No focal tenderness including negative Murphy sign and no tenderness to palpation at McBurney's point. Musculoskeletal: No lower extremity tenderness nor edema. No gross deformities of extremities. Neurologic:  Normal speech and language. No gross focal neurologic deficits are appreciated.  Skin:  Skin is warm, dry and intact.   ____________________________________________   LABS (all labs ordered are listed, but only abnormal results are displayed)  Labs Reviewed  COMPREHENSIVE METABOLIC PANEL - Abnormal; Notable for the following components:      Result Value   Sodium 134 (*)    Glucose, Bld 145 (*)    Creatinine, Ser 1.28 (*)    Total Protein 6.4 (*)    Albumin 3.4 (*)    AST 87 (*)    ALT 75 (*)     Total Bilirubin 2.7 (*)    GFR calc non Af Amer 37 (*)    GFR calc Af Amer 43 (*)    All other components within normal limits  CBC - Abnormal; Notable for the following components:   WBC 13.8 (*)    All other components within normal limits  URINALYSIS, COMPLETE (UACMP) WITH MICROSCOPIC - Abnormal; Notable for the following components:   Color, Urine YELLOW (*)    APPearance CLEAR (*)    Hgb urine dipstick SMALL (*)    Leukocytes,Ua TRACE (*)    All other components within normal limits  PROTIME-INR - Abnormal; Notable for the following components:   Prothrombin Time 16.4 (*)    INR 1.3 (*)    All other components within normal limits  URINE CULTURE  SARS CORONAVIRUS 2 (HOSPITAL ORDER, [place] [facility])  LIPASE, BLOOD  LACTIC ACID, PLASMA  PROCALCITONIN   ____________________________________________  EKG  ED ECG REPORT I, Hinda Kehr, the attending physician, personally viewed and interpreted this ECG.  Date: 05/30/2019 EKG Time: 4:12 AM Rate: 102 Rhythm: Sinus tachycardia with first-degree AV block QRS Axis: normal Intervals: Normal with some atrial premature complexes and borderline prolonged PR interval of 213 ms, normal QTc interval ST/T Wave abnormalities: Non-specific ST segment / T-wave changes, but no clear evidence of acute ischemia. Narrative Interpretation: no definitive evidence of acute ischemia; does not meet STEMI criteria.   ____________________________________________  RADIOLOGY I, Hinda Kehr, personally discussed these images and results by phone with the on-call radiologist and used this discussion as part of my medical decision making.    ED MD interpretation: Pneumoperitoneum likely from a perforation of the small bowel.  No evidence of acute abnormality on preoperative chest x-ray.  Official radiology report(s): Ct Abdomen Pelvis W Contrast  Result Date: 05/30/2019 CLINICAL DATA:  Nausea vomiting and abdominal pain  EXAM: CT ABDOMEN AND PELVIS WITH CONTRAST TECHNIQUE: Multidetector CT imaging of the abdomen and pelvis was performed using the standard protocol following bolus administration of intravenous contrast. CONTRAST:  43mL OMNIPAQUE IOHEXOL 300 MG/ML  SOLN COMPARISON:  CT 05/14/2016 FINDINGS: Lower chest: Lung bases demonstrate no acute consolidation or pleural effusion. The heart size is normal. Hepatobiliary: No focal liver abnormality is seen. Status post cholecystectomy. No biliary dilatation. Pancreas: Unremarkable. No pancreatic ductal dilatation or surrounding inflammatory changes. Spleen: Normal in size without focal abnormality. Adrenals/Urinary Tract: Adrenal glands are normal. No hydronephrosis. Subcentimeter hypodensities within the kidneys, too small to further characterize. The bladder is normal Stomach/Bowel: Stomach nonenlarged. Fluid-filled borderline to slightly enlarged proximal to mid small bowel. Mild wall thickening of left mid abdominal small bowel with moderate inflammatory change. Extraluminal gas collection adjacent to the slightly thickened small bowel, series 2, image number 52 through 58. Negative for colon wall thickening. Sigmoid colon diverticula. Vascular/Lymphatic: Moderate aortic atherosclerosis. No aneurysm. No significantly enlarged lymph nodes Reproductive: Uterus and bilateral adnexa are unremarkable. Other: Small foci of pneumoperitoneum adjacent to the liver. Fat containing supraumbilical ventral and umbilical hernias containing fat and small amount of free air. Extraluminal gas collection within the mid abdominal region to the left of midline consistent with viscus perforation. Edema within the mesentery at the site of perforation. Musculoskeletal: Chronic inferior endplate deformity at L3. IMPRESSION: 1. Pneumoperitoneum consistent with hollow viscus perforation. Suspected source is the proximal to mid small bowel within the mid abdomen slightly to the left of midline where  there is focal extraluminal gas collection adjacent to a fluid-filled slightly dilated and thickened loop of small bowel. Considerable inflammatory change in the mesentery. Differential considerations include perforated small bowel diverticulitis (a few other small bowel diverticular seen in the region), and ischemia. Doubt Meckel's due to relatively proximal location of the finding. 2. Umbilical and supraumbilical ventral hernias containing fat and small amounts of pneumoperitoneum Critical Value/emergent results were called by telephone at the time of interpretation on 05/30/2019 at 3:37 am to [place] , who verbally acknowledged these results. Electronically Signed   By: Donavan Foil M.D.   On: 05/30/2019 03:37   Dg Chest Portable 1 View  Result Date: 05/30/2019 CLINICAL DATA:  Preop.  History of lymphoma. EXAM: PORTABLE CHEST 1 VIEW COMPARISON:  None. FINDINGS: The heart size and mediastinal contours are within normal limits. Both lungs are clear. The visualized skeletal structures are unremarkable. IMPRESSION: No active disease. Electronically Signed   By: Cletus Gash.D.  On: 05/30/2019 04:23    ____________________________________________   PROCEDURES   Procedure(s) performed (including Critical Care):  Procedures   ____________________________________________   INITIAL IMPRESSION / MDM / [place] / ED COURSE  As part of my medical decision making, I reviewed the following data within the [place] History obtained from family, Nursing notes reviewed and incorporated, Labs reviewed , EKG interpreted , Old chart reviewed, Radiograph reviewed , Discussed with admitting physician (Dr. Dahlia Byes), A consult was requested and obtained from this/these consultant(s) Surgery and Notes from prior ED visits   Differential diagnosis includes, but is not limited to, viral infection, acute intra-abdominal infection such as appendicitis or cholecystitis,  other nonspecific biliary disease, pancreatitis, SBO/ileus.  The patient's vital signs are appropriate within normal limits.  Lab work, however, demonstrates a leukocytosis of almost 14, a slightly elevated creatinine of 1.28, and mildly elevated LFTs as well as a total bilirubin of 2.7.  The patient may benefit from an ultrasound but she has no tenderness to palpation of the right upper quadrant.  I have ordered 500 mL normal saline IV bolus, CT scan with IV contrast, and an in and out catheterization to check her urine.  I have also ordered a lactic acid and pro calcitonin.  I will reassess once imaging is back and she may benefit from an ultrasound but at this point I think that a CT scan will be more helpful in ruling out emergent medical issues.      Clinical Course as of May 29 441  Sat May 30, 2019  0313 Questionable UTI, very minimal findings, I doubt this is a source of infection.  Urine culture is pending and I will hold off on empiric treatment.  Urinalysis, Complete w Microscopic(!) [CF]  [id number] The radiologist called to discuss the imaging with me.  The patient has free air in her abdomen and it appears to be from a small bowel source.  I updated the patient and family, ordered cefepime 2 g IV and metronidazole 500 mg IV, and I am talking with Dr. Dahlia Byes with surgery.   [CF]  [id number] I spoke by phone with Dr. Dahlia Byes.  He agreed with antibiotics and will see the patient and take her to the operating room.  As per his request I also ordered another 500 mL of normal saline.  Patient is stable and comfortable at this time, not requiring analgesia, and she and her daughter understand and agree with the plan.   [CF]  [phone] Patient has been n.p.o. for about 6 hours according to the daughter.   [CF]  0425 Lactic Acid, Venous: 1.2 [CF]    Clinical Course User Index [CF] Hinda Kehr, MD     ____________________________________________  FINAL CLINICAL IMPRESSION(S) / ED DIAGNOSES  Final  diagnoses:  Pneumoperitoneum  Small bowel perforation ([place])     MEDICATIONS GIVEN DURING THIS VISIT:  Medications  ceFEPIme (MAXIPIME) 2 g in sodium chloride 0.9 % 100 mL IVPB (2 g Intravenous New Bag/Given 05/30/19 0403)    And  metroNIDAZOLE (FLAGYL) IVPB 500 mg (has no administration in time range)  sodium chloride 0.9 % bolus 500 mL (500 mLs Intravenous New Bag/Given 05/30/19 0246)  iohexol (OMNIPAQUE) 300 MG/ML solution 75 mL (70 mLs Intravenous Contrast Given 05/30/19 0256)  sodium chloride 0.9 % bolus 500 mL (500 mLs Intravenous New Bag/Given 05/30/19 0409)     ED Discharge Orders    None      *Please note:  Evangelynn S  [place] was evaluated in Emergency Department on 05/30/2019 for the symptoms described in the history of present illness. She was evaluated in the context of the global COVID-19 pandemic, which necessitated consideration that the patient might be at risk for infection with the SARS-CoV-2 virus that causes COVID-19. Institutional protocols and algorithms that pertain to the evaluation of patients at risk for COVID-19 are in a state of rapid change based on information released by regulatory bodies including the CDC and federal and state organizations. These policies and algorithms were followed during the patient's care in the ED.  Some ED evaluations and interventions may be delayed as a result of limited staffing during the pandemic.*  Note:  This document was prepared using Dragon voice recognition software and may include unintentional dictation errors.   Hinda Kehr, MD 05/30/19 [phone]

## 2019-05-30 NOTE — Anesthesia Postprocedure Evaluation (Signed)
Anesthesia Post Note  Patient: Jeanette Newton  Procedure(s) Performed: EXPLORATORY LAPAROTOMYand small bowel resection (N/A )  Patient location during evaluation: PACU Anesthesia Type: General Level of consciousness: awake and alert Pain management: pain level controlled Vital Signs Assessment: post-procedure vital signs reviewed and stable Respiratory status: spontaneous breathing, nonlabored ventilation, respiratory function stable and patient connected to nasal cannula oxygen Cardiovascular status: blood pressure returned to baseline and stable Postop Assessment: no apparent nausea or vomiting Anesthetic complications: no     Last Vitals:  Vitals:   05/30/19 0903 05/30/19 1003  BP: (!) 134/57 140/66  Pulse: 64 67  Resp: 17 19  Temp: 36.7 C 37.1 C  SpO2: 98% 97%    Last Pain:  Vitals:   05/30/19 1003  TempSrc: Axillary  PainSc:                  Precious Haws Ion Gonnella

## 2019-05-31 ENCOUNTER — Encounter: Payer: Self-pay | Admitting: Surgery

## 2019-05-31 LAB — CBC
HCT: 33.8 % — ABNORMAL LOW (ref 36.0–46.0)
Hemoglobin: 11.2 g/dL — ABNORMAL LOW (ref 12.0–15.0)
MCH: 29.5 pg (ref 26.0–34.0)
MCHC: 33.1 g/dL (ref 30.0–36.0)
MCV: 88.9 fL (ref 80.0–100.0)
Platelets: 159 10*3/uL (ref 150–400)
RBC: 3.8 MIL/uL — ABNORMAL LOW (ref 3.87–5.11)
RDW: 13.7 % (ref 11.5–15.5)
WBC: 9.8 10*3/uL (ref 4.0–10.5)
nRBC: 0 % (ref 0.0–0.2)

## 2019-05-31 LAB — BASIC METABOLIC PANEL
Anion gap: 6 (ref 5–15)
BUN: 18 mg/dL (ref 8–23)
CO2: 22 mmol/L (ref 22–32)
Calcium: 7.5 mg/dL — ABNORMAL LOW (ref 8.9–10.3)
Chloride: 108 mmol/L (ref 98–111)
Creatinine, Ser: 1.09 mg/dL — ABNORMAL HIGH (ref 0.44–1.00)
GFR calc Af Amer: 52 mL/min — ABNORMAL LOW (ref >60)
GFR calc non Af Amer: 45 mL/min — ABNORMAL LOW (ref >60)
Glucose, Bld: 137 mg/dL — ABNORMAL HIGH (ref 70–99)
Potassium: 4.1 mmol/L (ref 3.5–5.1)
Sodium: 136 mmol/L (ref 135–145)

## 2019-05-31 LAB — URINE CULTURE
Culture: <10000 — AB
Special Requests: NORMAL

## 2019-05-31 NOTE — Progress Notes (Signed)
POD # 1 Doing well Taking clears no N/V  Some flatus ASS, labs ok   PE NAD Abd: soft, appropriate tenderness, no peritonitis  A/p Doing well Full liquids mobilize

## 2019-06-01 LAB — BASIC METABOLIC PANEL
Anion gap: 5 (ref 5–15)
BUN: 18 mg/dL (ref 8–23)
CO2: 22 mmol/L (ref 22–32)
Calcium: 7.7 mg/dL — ABNORMAL LOW (ref 8.9–10.3)
Chloride: 111 mmol/L (ref 98–111)
Creatinine, Ser: 1.01 mg/dL — ABNORMAL HIGH (ref 0.44–1.00)
GFR calc Af Amer: 57 mL/min — ABNORMAL LOW (ref >60)
GFR calc non Af Amer: 49 mL/min — ABNORMAL LOW (ref >60)
Glucose, Bld: 89 mg/dL (ref 70–99)
Potassium: 3.8 mmol/L (ref 3.5–5.1)
Sodium: 138 mmol/L (ref 135–145)

## 2019-06-01 LAB — CBC
HCT: 32.2 % — ABNORMAL LOW (ref 36.0–46.0)
Hemoglobin: 10.7 g/dL — ABNORMAL LOW (ref 12.0–15.0)
MCH: 29.5 pg (ref 26.0–34.0)
MCHC: 33.2 g/dL (ref 30.0–36.0)
MCV: 88.7 fL (ref 80.0–100.0)
Platelets: 186 10*3/uL (ref 150–400)
RBC: 3.63 MIL/uL — ABNORMAL LOW (ref 3.87–5.11)
RDW: 13.7 % (ref 11.5–15.5)
WBC: 10.7 10*3/uL — ABNORMAL HIGH (ref 4.0–10.5)
nRBC: 0 % (ref 0.0–0.2)

## 2019-06-01 MED ORDER — SODIUM CHLORIDE 0.9 % IV SOLN
INTRAVENOUS | Status: DC | PRN
  Administered 2019-06-01: 10:00:00 250 mL via INTRAVENOUS

## 2019-06-01 MED ORDER — BOOST / RESOURCE BREEZE PO LIQD CUSTOM
1.0000 | Freq: Three times a day (TID) | ORAL | Status: DC
  Administered 2019-06-01 – 2019-06-03 (×6): 1 via ORAL

## 2019-06-01 NOTE — Progress Notes (Signed)
[place] Hospital Day(s): 2.   Post op day(s): 2 Days Post-Op.   Interval History: Patient seen and examined, no acute events or new complaints overnight. Patient reports that she feels more distended but her pain is improved. No issues with fever, chills, nausea, or emesis. She has tolerated full liquids without issue although she doesn't feel like she has much of an appetite. She is passing flatus. Mobilizing well with daughter who is at bedside. No other acute issues.    Vital signs in last 24 hours: [min-max] current  Temp:  [97.7 F (36.5 C)-98.5 F (36.9 C)] 98.3 F (36.8 C) (10/05 0426) Pulse Rate:  [59-70] 70 (10/05 0426) Resp:  [16-18] 16 (10/05 0426) BP: (133-143)/(58-64) 143/62 (10/05 0426) SpO2:  [94 %-97 %] 94 % (10/05 0426)     Height: 5\' 2"  (157.5 cm) Weight: 59 kg BMI (Calculated): 23.77   Intake/Output last 2 shifts:  10/04 0701 - 10/05 0700 In: 997.5 [I.V.:697.6; IV Piggyback:299.9] Out: 950 [Urine:950]   Physical Exam:  Constitutional: alert, cooperative and no distress  Respiratory: breathing non-labored at rest  Cardiovascular: regular rate and sinus rhythm  Gastrointestinal: soft, mild incisional soreness, and non-distended. No rebound/guarding Integumentary: laparotomy incision is CDI with staples, no erythema, mild drainage to the honeycomb dressing.   Labs:  CBC Latest Ref Rng & Units 06/01/2019 05/31/2019 05/30/2019  WBC 4.0 - 10.5 K/uL 10.7(H) 9.8 9.9  Hemoglobin 12.0 - 15.0 g/dL 10.7(L) 11.2(L) 13.9  Hematocrit 36.0 - 46.0 % 32.2(L) 33.8(L) 42.2  Platelets 150 - 400 K/uL 186 159 187   CMP Latest Ref Rng & Units 06/01/2019 05/31/2019 05/30/2019  Glucose 70 - 99 mg/dL 89 137(H) -  BUN 8 - 23 mg/dL 18 18 -  Creatinine 0.44 - 1.00 mg/dL 1.01(H) 1.09(H) 1.09(H)  Sodium 135 - 145 mmol/L 138 136 -  Potassium 3.5 - 5.1 mmol/L 3.8 4.1 -  Chloride 98 - 111 mmol/L 111 108 -  CO2 22 - 32 mmol/L 22 22 -  Calcium 8.9 -  10.3 mg/dL 7.7(L) 7.5(L) -  Total Protein 6.5 - 8.1 g/dL - - -  Total Bilirubin 0.3 - 1.2 mg/dL - - -  Alkaline Phos 38 - 126 U/L - - -  AST 15 - 41 U/L - - -  ALT 0 - 44 U/L - - -    Imaging studies: No new pertinent imaging studies   Assessment/Plan:  85 y.o. female with return of bowel fucntion 2 Days Post-Op s/p exploratory laparotomy, repair of large bowel enterotomy, and small bowel resection for pneumoperitoneum   - Will trial on soft diet and add nutritional supplementation (Boost)    - Wean IVF  - pain control prn: antiemetics prn   - monitor abdominal examination; on-going bowel function  - encouraged continued mobilization   - medical management of comorbidities    All of the above findings and recommendations were discussed with the patient, patient's family (daughter at bedside), and the medical team, and all of patient's and family's questions were answered to their expressed satisfaction.  -- Edison Simon, PA-C [facility] 06/01/2019, 11:52 AM [phone] M-F: 7am - 4pm

## 2019-06-02 LAB — BASIC METABOLIC PANEL
Anion gap: 5 (ref 5–15)
BUN: 16 mg/dL (ref 8–23)
CO2: 21 mmol/L — ABNORMAL LOW (ref 22–32)
Calcium: 7.4 mg/dL — ABNORMAL LOW (ref 8.9–10.3)
Chloride: 109 mmol/L (ref 98–111)
Creatinine, Ser: 0.92 mg/dL (ref 0.44–1.00)
GFR calc Af Amer: >60 mL/min (ref >60)
GFR calc non Af Amer: 55 mL/min — ABNORMAL LOW (ref >60)
Glucose, Bld: 93 mg/dL (ref 70–99)
Potassium: 3.3 mmol/L — ABNORMAL LOW (ref 3.5–5.1)
Sodium: 135 mmol/L (ref 135–145)

## 2019-06-02 LAB — CBC
HCT: 31.5 % — ABNORMAL LOW (ref 36.0–46.0)
Hemoglobin: 10.7 g/dL — ABNORMAL LOW (ref 12.0–15.0)
MCH: 29.6 pg (ref 26.0–34.0)
MCHC: 34 g/dL (ref 30.0–36.0)
MCV: 87.3 fL (ref 80.0–100.0)
Platelets: 207 10*3/uL (ref 150–400)
RBC: 3.61 MIL/uL — ABNORMAL LOW (ref 3.87–5.11)
RDW: 13.8 % (ref 11.5–15.5)
WBC: 8.8 10*3/uL (ref 4.0–10.5)
nRBC: 0 % (ref 0.0–0.2)

## 2019-06-02 MED ORDER — POTASSIUM CHLORIDE CRYS ER 20 MEQ PO TBCR
20.0000 meq | EXTENDED_RELEASE_TABLET | Freq: Two times a day (BID) | ORAL | Status: DC
  Administered 2019-06-02 – 2019-06-03 (×3): 20 meq via ORAL
  Filled 2019-06-02 (×3): qty 1

## 2019-06-02 NOTE — Progress Notes (Signed)
Per Edwyna Ready okay for RN to DC IVF.

## 2019-06-02 NOTE — Progress Notes (Signed)
[place] Hospital Day(s): 3.   Post op day(s): 3 Days Post-Op.   Interval History: Patient seen and examined, no acute events or new complaints overnight. Patient reports abdominal soreness but this is slowly improving. No fever, chills, nausea, or emesis. She continues to endorse flatus but no BM. She has tolerated soft diet without issue. Mobilizing well. She did have mild hypokalemia this morning to 3.3. No other acute issues.   Vital signs in last 24 hours: [min-max] current  Temp:  [97.6 F (36.4 C)-98 F (36.7 C)] 97.6 F (36.4 C) (10/06 0519) Pulse Rate:  [61-75] 75 (10/06 0906) Resp:  [18-20] 18 (10/06 0906) BP: (128-170)/(48-74) 128/48 (10/06 0906) SpO2:  [95 %-98 %] 97 % (10/06 0906)     Height: 5\' 2"  (157.5 cm) Weight: 59 kg BMI (Calculated): 23.77   Intake/Output last 2 shifts:  10/05 0701 - 10/06 0700 In: 3400 [P.O.:1240; I.V.:1659.9; IV Piggyback:500.1] Out: -    Physical Exam:  Constitutional: alert, cooperative and no distress  Respiratory: breathing non-labored at rest  Cardiovascular: regular rate and sinus rhythm  Gastrointestinal: soft, mild incisional soreness, and non-distended. No rebound/guarding Integumentary: laparotomy incision is CDI with staples, no erythema, no drainage   Labs:  CBC Latest Ref Rng & Units 06/02/2019 06/01/2019 05/31/2019  WBC 4.0 - 10.5 K/uL 8.8 10.7(H) 9.8  Hemoglobin 12.0 - 15.0 g/dL 10.7(L) 10.7(L) 11.2(L)  Hematocrit 36.0 - 46.0 % 31.5(L) 32.2(L) 33.8(L)  Platelets 150 - 400 K/uL 207 186 159   CMP Latest Ref Rng & Units 06/02/2019 06/01/2019 05/31/2019  Glucose 70 - 99 mg/dL 93 89 137(H)  BUN 8 - 23 mg/dL 16 18 18   Creatinine 0.44 - 1.00 mg/dL 0.92 1.01(H) 1.09(H)  Sodium 135 - 145 mmol/L 135 138 136  Potassium 3.5 - 5.1 mmol/L 3.3(L) 3.8 4.1  Chloride 98 - 111 mmol/L 109 111 108  CO2 22 - 32 mmol/L 21(L) 22 22  Calcium 8.9 - 10.3 mg/dL 7.4(L) 7.7(L) 7.5(L)  Total Protein 6.5 - 8.1  g/dL - - -  Total Bilirubin 0.3 - 1.2 mg/dL - - -  Alkaline Phos 38 - 126 U/L - - -  AST 15 - 41 U/L - - -  ALT 0 - 44 U/L - - -     Imaging studies: No new pertinent imaging studies   Assessment/Plan:  85 y.o. female with expected post-op soreness and return of bowel function 3 Days Post-Op s/p exploratory laparotomy, repair of large bowel enterotomy, and small bowel resection for pneumoperitoneum   - Continue soft diet today + nutritional supplementation (Boost [place])  - discontinue IVF  - Continue IV ABx (Cefepime + Flagyl); will discuss with pharmacy regarding PO ABx for home given allergy history  - pain control prn: antiemetics prn                   - monitor abdominal examination; on-going bowel function             - encouraged continued mobilization              - medical management of comorbidities   All of the above findings and recommendations were discussed with the patient, and the medical team, and all of patient's questions were answered to her expressed satisfaction.  -- Edison Simon, PA-C [facility] 06/02/2019, 10:25 AM [phone] M-F: 7am - 4pm

## 2019-06-02 NOTE — Care Management Important Message (Signed)
Important Message  Patient Details  Name: Jeanette Newton MRN: [id number] Date of Birth: 1927-09-27   Medicare Important Message Given:  Yes     [name] 06/02/2019, 10:48 AM

## 2019-06-03 LAB — SURGICAL PATHOLOGY

## 2019-06-03 LAB — BASIC METABOLIC PANEL
Anion gap: 5 (ref 5–15)
BUN: 15 mg/dL (ref 8–23)
CO2: 24 mmol/L (ref 22–32)
Calcium: 7.7 mg/dL — ABNORMAL LOW (ref 8.9–10.3)
Chloride: 110 mmol/L (ref 98–111)
Creatinine, Ser: 0.96 mg/dL (ref 0.44–1.00)
GFR calc Af Amer: >60 mL/min (ref >60)
GFR calc non Af Amer: 52 mL/min — ABNORMAL LOW (ref >60)
Glucose, Bld: 102 mg/dL — ABNORMAL HIGH (ref 70–99)
Potassium: 3.3 mmol/L — ABNORMAL LOW (ref 3.5–5.1)
Sodium: 139 mmol/L (ref 135–145)

## 2019-06-03 MED ORDER — METRONIDAZOLE 500 MG PO TABS: 500.0000 mg | ORAL_TABLET | Freq: Three times a day (TID) | ORAL | 0 refills | Status: AC

## 2019-06-03 MED ORDER — OXYCODONE HCL 5 MG PO TABS: 5.0000 mg | ORAL_TABLET | ORAL | 0 refills | Status: DC | PRN

## 2019-06-03 MED ORDER — CEFPODOXIME PROXETIL 200 MG PO TABS: 400.0000 mg | ORAL_TABLET | Freq: Two times a day (BID) | ORAL | 0 refills | Status: AC

## 2019-06-03 NOTE — Discharge Summary (Signed)
[facility]ATES SURGICAL DISCHARGE SUMMARY  Patient ID: Jeanette Newton MRN: [id number] DOB/AGE: 04/09/1928 85 y.o.  Admit date: 05/30/2019 Discharge date: 06/03/2019  Discharge Diagnoses Patient Active Problem List   Diagnosis Date Noted  . Bowel perforation ([place]) 05/30/2019   Consultants None  Procedures 05/30/2019:  Laparotomy with repair of large bowel enterotomy and small bowel resection with stapled primary anastomosis  HPI: Jeanette Newton is a 85 y.o. female seen in consultation at the request of Dr. Karma Greaser.  She reports a 2-day history of abdominal pain nausea and vomiting.  The daughter reports being confused.  She reports that the pain is moderate in intensity, intermittent worsening with certain movement.  Pain is also sharp.  No fevers,  no chills, she has been taking good precautions against COVID-19 and being isolated.  She is very functional and is able to perform more than 4 METS of activity without any shortness of breath or chest pain.  She walks without a cane or walker.  The daughter is by the bedside.  She did get a CT scan of the abdomen pelvis that I have personally reviewed showing evidence of free air with thickening of the small bowel in the midportion.  She does have a ventral hernia as well.  She does have diverticulosis without diverticulitis.  White count is 13.8 hemoglobin is 13 platelets 200,000.  CMP shows a creatinine of 1.28 that is close to baseline.  Total bilirubin of 2.7 and a sodium of 134.  Hospital Course: Informed consent was obtained and documented, and patient underwent uneventful emergent laparotomy with repair of large bowel enterotomy and small bowel resection with stapled primary anastomosis (Dr Dahlia Byes, 05/30/2019).  Post-operatively, patient's pain and symptoms improved/resolved and advancement of patient's diet and ambulation were well-tolerated. The remainder of patient's hospital course was essentially unremarkable, and discharge  planning was initiated accordingly with patient safely able to be discharged home with appropriate discharge instructions, antibiotics (cefpodoxime + Flagyl x7 days), pain control, and outpatient follow-up after all of her and her family's questions were answered to their expressed satisfaction.   Discharge Condition: Good   Physical Examination:  Constitutional: alert, cooperative and no distress  Respiratory: breathing non-labored at rest  Cardiovascular: regular rate and sinus rhythm  Gastrointestinal: soft,non-tender this morning, and non-distended. No rebound/guarding Integumentary:laparotomy incision is CDI with staples, no erythema, no drainage   Allergies as of 06/03/2019      Reactions   Clarithromycin Nausea Only   Biaxin   Clindamycin Nausea Only   Ciprofloxacin Swelling, Rash   Pt states she can take this medication   Doxycycline Hyclate Rash   Etodolac Rash   Levaquin [levofloxacin In D5w] Rash   Penicillins Rash   Propranolol Itching, Rash      Medication List    TAKE these medications   aspirin EC 81 MG tablet Take 81 mg by mouth daily.   Calcium-Vitamin D 600-200 MG-UNIT tablet Take 1 tablet by mouth 2 (two) times daily.   cefpodoxime 200 MG tablet Commonly known as: VANTIN Take 2 tablets (400 mg total) by mouth 2 (two) times daily for 7 days.   FISH OIL PO Take 1 capsule by mouth 2 (two) times daily.   levothyroxine 75 MCG tablet Commonly known as: SYNTHROID Take 75 mcg by mouth daily before breakfast.   metroNIDAZOLE 500 MG tablet Commonly known as: Flagyl Take 1 tablet (500 mg total) by mouth 3 (three) times daily for 7 days.   oxyCODONE 5 MG immediate  release tablet Commonly known as: Oxy IR/ROXICODONE Take 1 tablet (5 mg total) by mouth every 4 (four) hours as needed for severe pain or breakthrough pain.   Vitamin D3 50 MCG (2000 UT) Tabs Take 2,000 Units by mouth daily.        Follow-up Information    Pabon, [place] F, MD. Schedule  an appointment as soon as possible for a visit in 1 week(s).   Specialty: General Surgery Why: follow up 1 week s/p ex lap with SBR....has staples...okay to see Thedore Mins PA if needed Contact information: [address] [place] [address] [phone]            Time spent on discharge management including discussion of hospital course, clinical condition, outpatient instructions, prescriptions, and follow up with the patient and members of the medical team: >30 minutes  -- Edison Simon , PA-C [facility]  06/03/2019, 11:45 AM [phone] M-F: 7am - 4pm

## 2019-06-03 NOTE — Progress Notes (Signed)
Junnie S Roll  A and O x 4. VSS. Pt tolerating diet well. No complaints of pain or nausea. IV removed intact, prescriptions given. Pt voiced understanding of discharge instructions with no further questions. Pt discharged via wheelchair with RN.   Allergies as of 06/03/2019      Reactions   Clarithromycin Nausea Only   Biaxin   Clindamycin Nausea Only   Ciprofloxacin Swelling, Rash   Pt states she can take this medication   Doxycycline Hyclate Rash   Etodolac Rash   Levaquin [levofloxacin In D5w] Rash   Penicillins Rash   Propranolol Itching, Rash      Medication List    TAKE these medications   aspirin EC 81 MG tablet Take 81 mg by mouth daily.   Calcium-Vitamin D 600-200 MG-UNIT tablet Take 1 tablet by mouth 2 (two) times daily.   cefpodoxime 200 MG tablet Commonly known as: VANTIN Take 2 tablets (400 mg total) by mouth 2 (two) times daily for 7 days.   FISH OIL PO Take 1 capsule by mouth 2 (two) times daily.   levothyroxine 75 MCG tablet Commonly known as: SYNTHROID Take 75 mcg by mouth daily before breakfast.   metroNIDAZOLE 500 MG tablet Commonly known as: Flagyl Take 1 tablet (500 mg total) by mouth 3 (three) times daily for 7 days.   oxyCODONE 5 MG immediate release tablet Commonly known as: Oxy IR/ROXICODONE Take 1 tablet (5 mg total) by mouth every 4 (four) hours as needed for severe pain or breakthrough pain.   Vitamin D3 50 MCG (2000 UT) Tabs Take 2,000 Units by mouth daily.       Vitals:   06/03/19 0409 06/03/19 1202  BP: (!) 150/64 (!) 133/57  Pulse: 66 61  Resp: 18 16  Temp: 97.9 F (36.6 C) (!) 97.5 F (36.4 C)  SpO2: 98%     Francesco Sor

## 2019-06-10 ENCOUNTER — Ambulatory Visit (INDEPENDENT_AMBULATORY_CARE_PROVIDER_SITE_OTHER): Payer: Medicare Other | Admitting: Physician Assistant

## 2019-06-10 ENCOUNTER — Other Ambulatory Visit: Payer: Self-pay

## 2019-06-10 ENCOUNTER — Encounter: Payer: Self-pay | Admitting: Physician Assistant

## 2019-06-10 VITALS — BP 159/81 | HR 75 | Temp 97.3°F | Ht 62.0 in | Wt 149.0 lb

## 2019-06-10 DIAGNOSIS — Z09 Encounter for follow-up examination after completed treatment for conditions other than malignant neoplasm: Secondary | ICD-10-CM

## 2019-06-10 DIAGNOSIS — K631 Perforation of intestine (nontraumatic): Secondary | ICD-10-CM

## 2019-06-10 NOTE — Progress Notes (Signed)
[facility]GICAL [facility]IATES POST-OP OFFICE VISIT  06/10/2019  HPI: Jeanette Newton is a 85 y.o. female 11 days s/p laparotomy with small bowel resection with Dr Dahlia Byes for perforated viscus.   Today, she reports that she is doing well. Still with some incisional soreness. No fever, chills, nausea, or emesis. She has had a decreased appetite but they are supplementing with Boost. Mobilizing. Having bowel function. She is having peripheral edema since hospitalization, suspected fluid overload from resuscitation. She does have compression stockings.   Vital signs: BP (!) 159/81   Pulse 75   Temp (!) 97.3 F (36.3 C)   Ht 5\' 2"  (1.575 m)   Wt 149 lb (67.6 kg)   SpO2 95%   BMI 27.25 kg/m   Physical Exam: Constitutional: Well appearing female, NAD Abdomen: Soft, Non-tender, non-distended, no rebound or guarding MSK: 2+ pitting edema to mid-tibia bilaterally Skin: Laprotomy incision is CDI with staples (removed in clinic), no erythema or drainage, surrounding ecchymosis  Assessment/Plan: This is a 85 y.o. female 11 days s/p laparotomy with small bowel resection    - pain control prn  - encouraged continued nutritional supplementation  - stapes removed, steri-strips placed  - elevate feet, wear compression stockings, will follow up on this +/- Lasix  - lifting restrictions; light activity okay  - wound care reviewed  - precautions reviewed  - rtc 2 weeks  -- Edison Simon, PA-C [facility] 06/10/2019, 10:14 AM [phone] M-F: 7am - 4pm

## 2019-06-25 ENCOUNTER — Other Ambulatory Visit: Payer: Self-pay

## 2019-06-25 ENCOUNTER — Encounter: Payer: Self-pay | Admitting: Physician Assistant

## 2019-06-25 ENCOUNTER — Ambulatory Visit (INDEPENDENT_AMBULATORY_CARE_PROVIDER_SITE_OTHER): Payer: Self-pay | Admitting: Physician Assistant

## 2019-06-25 VITALS — BP 168/76 | HR 64 | Temp 97.9°F | Ht 62.0 in | Wt 139.4 lb

## 2019-06-25 DIAGNOSIS — K631 Perforation of intestine (nontraumatic): Secondary | ICD-10-CM

## 2019-06-25 DIAGNOSIS — Z09 Encounter for follow-up examination after completed treatment for conditions other than malignant neoplasm: Secondary | ICD-10-CM

## 2019-06-25 NOTE — Patient Instructions (Signed)
May use 1 capful of Miralax in 8 ounces of water/liquid every morning. May adjust as needed.   Follow-up with our office as needed.  Please call and ask to speak with a nurse if you develop questions or concerns.

## 2019-06-25 NOTE — Progress Notes (Signed)
[facility] SURGICAL [facility]OCIATES POST-OP OFFICE VISIT  06/25/2019  HPI: Jeanette Newton is a 85 y.o. female 26 days s/p laparotomy with small bowel resection with Dr Dahlia Byes for perforated viscus.   Today, she presents with her daughter who both report she is doing well. No issues with pain, fever, chills, nausea, or emesis. Still with decreased appetite but eating high protein diet and adding nutritional supplementation. Stools daily but are small, worried about constipation. No other issues.   Vital signs: BP (!) 168/76   Pulse 64   Temp 97.9 F (36.6 C) (Temporal)   Ht 5\' 2"  (1.575 m)   Wt 139 lb 6.4 oz (63.2 kg)   SpO2 97%   BMI 25.50 kg/m    Physical Exam: Constitutional: Well appearing female, NAD Abdomen: Soft, non-tender, non-distended, no rebound/guarding Skin: Laparotomy incision is well healed, small scab to inferior portion, no erythema or drainage.   Assessment/Plan: This is a 85 y.o. female 26 days s/p laparotomy with small bowel resection   - Discussed Miralax for constipation  - pain control prn  - follow up prn; advise to call with questions  -- Edison Simon, PA-C [facility] 06/25/2019, 10:12 AM [phone] M-F: 7am - 4pm

## 2019-07-20 ENCOUNTER — Encounter: Payer: Self-pay | Admitting: *Deleted

## 2019-07-20 ENCOUNTER — Other Ambulatory Visit: Payer: Self-pay

## 2019-07-21 NOTE — Discharge Instructions (Signed)

## 2019-07-24 ENCOUNTER — Other Ambulatory Visit
Admission: RE | Admit: 2019-07-24 | Discharge: 2019-07-24 | Disposition: A | Discharge status: Home / Self Care | Payer: Medicare Other | Source: Ambulatory Visit | Attending: Ophthalmology | Admitting: Ophthalmology

## 2019-07-24 DIAGNOSIS — Z01812 Encounter for preprocedural laboratory examination: Secondary | ICD-10-CM | POA: Diagnosis present

## 2019-07-24 DIAGNOSIS — Z20828 Contact with and (suspected) exposure to other viral communicable diseases: Secondary | ICD-10-CM | POA: Diagnosis not present

## 2019-07-24 LAB — SARS CORONAVIRUS 2 (TAT 6-24 HRS): SARS Coronavirus 2: NEGATIVE

## 2019-07-28 ENCOUNTER — Encounter
Admission: RE | Disposition: A | Discharge status: Home / Self Care | Payer: Self-pay | Source: Home / Self Care | Attending: Ophthalmology

## 2019-07-28 ENCOUNTER — Ambulatory Visit
Admission: RE | Admit: 2019-07-28 | Discharge: 2019-07-28 | Disposition: A | Discharge status: Home / Self Care | Payer: Medicare Other | Attending: Ophthalmology | Admitting: Ophthalmology

## 2019-07-28 ENCOUNTER — Other Ambulatory Visit: Payer: Self-pay

## 2019-07-28 ENCOUNTER — Ambulatory Visit: Payer: Medicare Other | Admitting: Anesthesiology

## 2019-07-28 DIAGNOSIS — I10 Essential (primary) hypertension: Secondary | ICD-10-CM | POA: Diagnosis not present

## 2019-07-28 DIAGNOSIS — Z7982 Long term (current) use of aspirin: Secondary | ICD-10-CM | POA: Insufficient documentation

## 2019-07-28 DIAGNOSIS — H2512 Age-related nuclear cataract, left eye: Secondary | ICD-10-CM | POA: Insufficient documentation

## 2019-07-28 DIAGNOSIS — Z79899 Other long term (current) drug therapy: Secondary | ICD-10-CM | POA: Diagnosis not present

## 2019-07-28 DIAGNOSIS — Z853 Personal history of malignant neoplasm of breast: Secondary | ICD-10-CM | POA: Insufficient documentation

## 2019-07-28 DIAGNOSIS — E039 Hypothyroidism, unspecified: Secondary | ICD-10-CM | POA: Insufficient documentation

## 2019-07-28 DIAGNOSIS — Z79890 Hormone replacement therapy (postmenopausal): Secondary | ICD-10-CM | POA: Diagnosis not present

## 2019-07-28 HISTORY — DX: Presence of external hearing-aid: Z97.4

## 2019-07-28 HISTORY — PX: CATARACT EXTRACTION W/PHACO: SHX586

## 2019-07-28 SURGERY — PHACOEMULSIFICATION, CATARACT, WITH IOL INSERTION
Anesthesia: Monitor Anesthesia Care | Site: Eye | Laterality: Left

## 2019-07-28 MED ORDER — MIDAZOLAM HCL 2 MG/2ML IJ SOLN
INTRAMUSCULAR | Status: DC | PRN
  Administered 2019-07-28: 1 mg via INTRAVENOUS

## 2019-07-28 MED ORDER — FENTANYL CITRATE (PF) 100 MCG/2ML IJ SOLN
INTRAMUSCULAR | Status: DC | PRN
  Administered 2019-07-28: 25 ug via INTRAVENOUS

## 2019-07-28 MED ORDER — ARMC OPHTHALMIC DILATING DROPS
1.0000 "application " | OPHTHALMIC | Status: DC | PRN
  Administered 2019-07-28 (×3): 1 via OPHTHALMIC

## 2019-07-28 MED ORDER — TETRACAINE HCL 0.5 % OP SOLN
1.0000 [drp] | OPHTHALMIC | Status: DC | PRN
  Administered 2019-07-28 (×3): 1 [drp] via OPHTHALMIC

## 2019-07-28 MED ORDER — BRIMONIDINE TARTRATE-TIMOLOL 0.2-0.5 % OP SOLN
OPHTHALMIC | Status: DC | PRN
  Administered 2019-07-28: 1 [drp] via OPHTHALMIC

## 2019-07-28 MED ORDER — EPINEPHRINE PF 1 MG/ML IJ SOLN
INTRAOCULAR | Status: DC | PRN
  Administered 2019-07-28: 83 mL via OPHTHALMIC

## 2019-07-28 MED ORDER — MOXIFLOXACIN HCL 0.5 % OP SOLN
OPHTHALMIC | Status: DC | PRN
  Administered 2019-07-28: 0.2 mL via OPHTHALMIC

## 2019-07-28 MED ORDER — LIDOCAINE HCL (PF) 2 % IJ SOLN
INTRAOCULAR | Status: DC | PRN
  Administered 2019-07-28: 1 mL

## 2019-07-28 MED ORDER — NA CHONDROIT SULF-NA HYALURON 40-17 MG/ML IO SOLN
INTRAOCULAR | Status: DC | PRN
  Administered 2019-07-28: 1 mL via INTRAOCULAR

## 2019-07-28 SURGICAL SUPPLY — 18 items
CANNULA ANT/CHMB 27GA (MISCELLANEOUS) ×6 IMPLANT
GLOVE SURG LX 8.0 MICRO (GLOVE) ×2
GLOVE SURG LX STRL 8.0 MICRO (GLOVE) ×1 IMPLANT
GLOVE SURG TRIUMPH 8.0 PF LTX (GLOVE) ×3 IMPLANT
GOWN STRL REUS W/ TWL LRG LVL3 (GOWN DISPOSABLE) ×2 IMPLANT
GOWN STRL REUS W/TWL LRG LVL3 (GOWN DISPOSABLE) ×4
LENS IOL TECNIS ITEC 23.5 (Intraocular Lens) ×3 IMPLANT
MARKER SKIN DUAL TIP RULER LAB (MISCELLANEOUS) ×3 IMPLANT
NDL RETROBULBAR .5 NSTRL (NEEDLE) ×3 IMPLANT
NEEDLE FILTER BLUNT 18X 1/2SAF (NEEDLE) ×2
NEEDLE FILTER BLUNT 18X1 1/2 (NEEDLE) ×1 IMPLANT
PACK EYE AFTER SURG (MISCELLANEOUS) ×3 IMPLANT
PACK OPTHALMIC (MISCELLANEOUS) ×3 IMPLANT
PACK PORFILIO (MISCELLANEOUS) ×3 IMPLANT
SYR 3ML LL SCALE MARK (SYRINGE) ×3 IMPLANT
SYR TB 1ML LUER SLIP (SYRINGE) ×3 IMPLANT
WATER STERILE IRR 250ML POUR (IV SOLUTION) ×3 IMPLANT
WIPE NON LINTING 3.25X3.25 (MISCELLANEOUS) ×3 IMPLANT

## 2019-07-28 NOTE — Anesthesia Procedure Notes (Signed)
Procedure Name: MAC Performed by: Izetta Dakin, CRNA Pre-anesthesia Checklist: Patient identified, Suction available, Emergency Drugs available, Patient being monitored and Timeout performed Patient Re-evaluated:Patient Re-evaluated prior to induction Oxygen Delivery Method: Nasal cannula

## 2019-07-28 NOTE — Transfer of Care (Addendum)
Immediate Anesthesia Transfer of Care Note  Patient: Jeanette Newton  Procedure(s) Performed: CATARACT EXTRACTION PHACO AND INTRAOCULAR LENS PLACEMENT (IOC) LEFT 7.83  00:53.4 (Left Eye)  Patient Location: PACU  Anesthesia Type: MAC  Level of Consciousness: awake, alert  and patient cooperative  Airway and Oxygen Therapy: Patient Spontanous Breathing and Patient connected to supplemental oxygen  Post-op Assessment: Post-op Vital signs reviewed, Patient's Cardiovascular Status Stable, Respiratory Function Stable, Patent Airway and No signs of Nausea or vomiting  Post-op Vital Signs: Reviewed and stable  Complications: No apparent anesthesia complications

## 2019-07-28 NOTE — Anesthesia Preprocedure Evaluation (Signed)
Anesthesia Evaluation  Patient identified by MRN, date of birth, ID band Patient awake    Reviewed: Allergy & Precautions, NPO status , Patient's Chart, lab work & pertinent test results  History of Anesthesia Complications Negative for: history of anesthetic complications  Airway Mallampati: III  TM Distance: >3 FB Neck ROM: Limited    Dental   Pulmonary    breath sounds clear to auscultation       Cardiovascular hypertension, (-) angina(-) DOE  Rhythm:Regular Rate:Normal     Neuro/Psych    GI/Hepatic neg GERD  ,  Endo/Other  Hypothyroidism   Renal/GU      Musculoskeletal   Abdominal   Peds  Hematology   Anesthesia Other Findings Lymphoma Breast cancer   Reproductive/Obstetrics                             Anesthesia Physical Anesthesia Plan  ASA: III  Anesthesia Plan: MAC   Post-op Pain Management:    Induction: Intravenous  PONV Risk Score and Plan: 2 and TIVA, Midazolam and Treatment may vary due to age or medical condition  Airway Management Planned: Nasal Cannula  Additional Equipment:   Intra-op Plan:   Post-operative Plan:   Informed Consent: I have reviewed the patients History and Physical, chart, labs and discussed the procedure including the risks, benefits and alternatives for the proposed anesthesia with the patient or authorized representative who has indicated his/her understanding and acceptance.       Plan Discussed with: CRNA and Anesthesiologist  Anesthesia Plan Comments:         Anesthesia Quick Evaluation

## 2019-07-28 NOTE — Anesthesia Procedure Notes (Signed)
Procedure Name: MAC Performed by: Izetta Dakin, CRNA Pre-anesthesia Checklist: Timeout performed, Patient being monitored, Suction available, Emergency Drugs available and Patient identified Patient Re-evaluated:Patient Re-evaluated prior to induction Oxygen Delivery Method: Nasal cannula

## 2019-07-28 NOTE — Op Note (Signed)
PREOPERATIVE DIAGNOSIS:  Nuclear sclerotic cataract of the left eye.   POSTOPERATIVE DIAGNOSIS:  Nuclear sclerotic cataract of the left eye.   OPERATIVE PROCEDURE:@   SURGEON:  Birder Robson, MD.   ANESTHESIA:  Anesthesiologist: Heniser, Fredric Dine, MD CRNA: Mayme Genta, CRNA; Izetta Dakin, CRNA  1.      Managed anesthesia care. 2.     0.35ml of Shugarcaine was instilled following the paracentesis   COMPLICATIONS:  None.   TECHNIQUE:   Stop and chop   DESCRIPTION OF PROCEDURE:  The patient was examined and consented in the preoperative holding area where the aforementioned topical anesthesia was applied to the left eye and then brought back to the Operating Room where the left eye was prepped and draped in the usual sterile ophthalmic fashion and a lid speculum was placed. A paracentesis was created with the side port blade and the anterior chamber was filled with viscoelastic. A near clear corneal incision was performed with the steel keratome. A continuous curvilinear capsulorrhexis was performed with a cystotome followed by the capsulorrhexis forceps. Hydrodissection and hydrodelineation were carried out with BSS on a blunt cannula. The lens was removed in a stop and chop  technique and the remaining cortical material was removed with the irrigation-aspiration handpiece. The capsular bag was inflated with viscoelastic and the Technis [id number] lens was placed in the capsular bag without complication. The remaining viscoelastic was removed from the eye with the irrigation-aspiration handpiece. The wounds were hydrated. The anterior chamber was flushed with BSS and the eye was inflated to physiologic pressure. 0.29ml Vigamox was placed in the anterior chamber. The wounds were found to be water tight. The eye was dressed with Combigan. The patient was given protective glasses to wear throughout the day and a shield with which to sleep tonight. The patient was also given drops with which to  begin a drop regimen today and will follow-up with me in one day. Implant Name Type Inv. Item Serial No. Manufacturer Lot No. LRB No. Used Action  LENS IOL DIOP 23.5 - [id number] Intraocular Lens LENS IOL DIOP 23.5 [id number]  Left 1 Implanted    Procedure(s): CATARACT EXTRACTION PHACO AND INTRAOCULAR LENS PLACEMENT (IOC) LEFT 7.83  00:53.4 (Left)  Electronically signed: Birder Robson 07/28/2019 11:01 AM

## 2019-07-28 NOTE — Anesthesia Postprocedure Evaluation (Signed)
Anesthesia Post Note  Patient: Jeanette Newton  Procedure(s) Performed: CATARACT EXTRACTION PHACO AND INTRAOCULAR LENS PLACEMENT (IOC) LEFT 7.83  00:53.4 (Left Eye)     Patient location during evaluation: PACU Anesthesia Type: MAC Level of consciousness: awake and alert Pain management: pain level controlled Vital Signs Assessment: post-procedure vital signs reviewed and stable Respiratory status: spontaneous breathing, nonlabored ventilation, respiratory function stable and patient connected to nasal cannula oxygen Cardiovascular status: stable and blood pressure returned to baseline Postop Assessment: no apparent nausea or vomiting Anesthetic complications: no    Tondalaya Perren A  Dashonna Chagnon

## 2019-07-28 NOTE — H&P (Signed)
All labs reviewed. Abnormal studies sent to patients PCP when indicated.  Previous H&P reviewed, patient examined, there are NO CHANGES.  Jeanette Huy Porfilio12/1/202010:33 AM

## 2019-07-29 ENCOUNTER — Encounter: Payer: Self-pay | Admitting: Ophthalmology

## 2019-08-18 ENCOUNTER — Encounter: Payer: Self-pay | Admitting: Ophthalmology

## 2019-08-18 ENCOUNTER — Other Ambulatory Visit: Payer: Self-pay

## 2019-08-19 NOTE — Discharge Instructions (Signed)

## 2019-08-20 ENCOUNTER — Other Ambulatory Visit: Payer: Self-pay

## 2019-08-20 ENCOUNTER — Other Ambulatory Visit
Admission: RE | Admit: 2019-08-20 | Discharge: 2019-08-20 | Disposition: A | Discharge status: Home / Self Care | Payer: Medicare Other | Source: Ambulatory Visit | Attending: Ophthalmology | Admitting: Ophthalmology

## 2019-08-20 DIAGNOSIS — Z01812 Encounter for preprocedural laboratory examination: Secondary | ICD-10-CM | POA: Diagnosis present

## 2019-08-20 DIAGNOSIS — Z20828 Contact with and (suspected) exposure to other viral communicable diseases: Secondary | ICD-10-CM | POA: Diagnosis not present

## 2019-08-20 LAB — SARS CORONAVIRUS 2 (TAT 6-24 HRS): SARS Coronavirus 2: NEGATIVE

## 2019-08-25 ENCOUNTER — Encounter: Payer: Self-pay | Admitting: Ophthalmology

## 2019-08-25 ENCOUNTER — Ambulatory Visit: Payer: Medicare Other | Admitting: Anesthesiology

## 2019-08-25 ENCOUNTER — Encounter
Admission: RE | Disposition: A | Discharge status: Home / Self Care | Payer: Self-pay | Source: Home / Self Care | Attending: Ophthalmology

## 2019-08-25 ENCOUNTER — Ambulatory Visit
Admission: RE | Admit: 2019-08-25 | Discharge: 2019-08-25 | Disposition: A | Discharge status: Home / Self Care | Payer: Medicare Other | Attending: Ophthalmology | Admitting: Ophthalmology

## 2019-08-25 ENCOUNTER — Other Ambulatory Visit: Payer: Self-pay

## 2019-08-25 DIAGNOSIS — H9190 Unspecified hearing loss, unspecified ear: Secondary | ICD-10-CM | POA: Insufficient documentation

## 2019-08-25 DIAGNOSIS — Z9049 Acquired absence of other specified parts of digestive tract: Secondary | ICD-10-CM | POA: Insufficient documentation

## 2019-08-25 DIAGNOSIS — Z853 Personal history of malignant neoplasm of breast: Secondary | ICD-10-CM | POA: Diagnosis not present

## 2019-08-25 DIAGNOSIS — Z79899 Other long term (current) drug therapy: Secondary | ICD-10-CM | POA: Insufficient documentation

## 2019-08-25 DIAGNOSIS — Z7982 Long term (current) use of aspirin: Secondary | ICD-10-CM | POA: Diagnosis not present

## 2019-08-25 DIAGNOSIS — E039 Hypothyroidism, unspecified: Secondary | ICD-10-CM | POA: Insufficient documentation

## 2019-08-25 DIAGNOSIS — Z923 Personal history of irradiation: Secondary | ICD-10-CM | POA: Diagnosis not present

## 2019-08-25 DIAGNOSIS — I10 Essential (primary) hypertension: Secondary | ICD-10-CM | POA: Insufficient documentation

## 2019-08-25 DIAGNOSIS — Z9221 Personal history of antineoplastic chemotherapy: Secondary | ICD-10-CM | POA: Diagnosis not present

## 2019-08-25 DIAGNOSIS — Z8572 Personal history of non-Hodgkin lymphomas: Secondary | ICD-10-CM | POA: Insufficient documentation

## 2019-08-25 DIAGNOSIS — Z79890 Hormone replacement therapy (postmenopausal): Secondary | ICD-10-CM | POA: Diagnosis not present

## 2019-08-25 DIAGNOSIS — H2511 Age-related nuclear cataract, right eye: Secondary | ICD-10-CM | POA: Diagnosis present

## 2019-08-25 HISTORY — PX: CATARACT EXTRACTION W/PHACO: SHX586

## 2019-08-25 SURGERY — PHACOEMULSIFICATION, CATARACT, WITH IOL INSERTION
Anesthesia: Monitor Anesthesia Care | Site: Eye | Laterality: Right

## 2019-08-25 MED ORDER — POLYMYXIN B-TRIMETHOPRIM 10000-0.1 UNIT/ML-% OP SOLN
OPHTHALMIC | Status: DC | PRN
  Administered 2019-08-25: 1 [drp] via OPHTHALMIC

## 2019-08-25 MED ORDER — FENTANYL CITRATE (PF) 100 MCG/2ML IJ SOLN
INTRAMUSCULAR | Status: DC | PRN
  Administered 2019-08-25: 25 ug via INTRAVENOUS

## 2019-08-25 MED ORDER — MIDAZOLAM HCL 2 MG/2ML IJ SOLN
INTRAMUSCULAR | Status: DC | PRN
  Administered 2019-08-25: 1 mg via INTRAVENOUS

## 2019-08-25 MED ORDER — BRIMONIDINE TARTRATE-TIMOLOL 0.2-0.5 % OP SOLN
OPHTHALMIC | Status: DC | PRN
  Administered 2019-08-25: 1 [drp] via OPHTHALMIC

## 2019-08-25 MED ORDER — TETRACAINE HCL 0.5 % OP SOLN
1.0000 [drp] | OPHTHALMIC | Status: DC | PRN
  Administered 2019-08-25 (×3): 1 [drp] via OPHTHALMIC

## 2019-08-25 MED ORDER — LIDOCAINE HCL (PF) 2 % IJ SOLN
INTRAOCULAR | Status: DC | PRN
  Administered 2019-08-25: 2 mL

## 2019-08-25 MED ORDER — NA CHONDROIT SULF-NA HYALURON 40-17 MG/ML IO SOLN
INTRAOCULAR | Status: DC | PRN
  Administered 2019-08-25: 1 mL via INTRAOCULAR

## 2019-08-25 MED ORDER — ARMC OPHTHALMIC DILATING DROPS
1.0000 "application " | OPHTHALMIC | Status: DC | PRN
  Administered 2019-08-25 (×3): 1 via OPHTHALMIC

## 2019-08-25 MED ORDER — LACTATED RINGERS IV SOLN: INTRAVENOUS | Status: DC

## 2019-08-25 MED ORDER — EPINEPHRINE PF 1 MG/ML IJ SOLN
INTRAOCULAR | Status: DC | PRN
  Administered 2019-08-25: 08:00:00 54 mL via OPHTHALMIC

## 2019-08-25 SURGICAL SUPPLY — 20 items
CANNULA ANT/CHMB 27G (MISCELLANEOUS) ×2 IMPLANT
CANNULA ANT/CHMB 27GA (MISCELLANEOUS) ×6 IMPLANT
GLOVE SURG LX 8.0 MICRO (GLOVE) ×2
GLOVE SURG LX STRL 8.0 MICRO (GLOVE) ×1 IMPLANT
GLOVE SURG TRIUMPH 8.0 PF LTX (GLOVE) ×3 IMPLANT
GOWN STRL REUS W/ TWL LRG LVL3 (GOWN DISPOSABLE) ×2 IMPLANT
GOWN STRL REUS W/TWL LRG LVL3 (GOWN DISPOSABLE) ×4
LENS IOL TECNIS ITEC 23.5 (Intraocular Lens) ×2 IMPLANT
MARKER SKIN DUAL TIP RULER LAB (MISCELLANEOUS) ×3 IMPLANT
NDL FILTER BLUNT 18X1 1/2 (NEEDLE) ×1 IMPLANT
NDL RETROBULBAR .5 NSTRL (NEEDLE) ×3 IMPLANT
NEEDLE FILTER BLUNT 18X 1/2SAF (NEEDLE) ×2
NEEDLE FILTER BLUNT 18X1 1/2 (NEEDLE) ×1 IMPLANT
PACK EYE AFTER SURG (MISCELLANEOUS) ×3 IMPLANT
PACK OPTHALMIC (MISCELLANEOUS) ×3 IMPLANT
PACK PORFILIO (MISCELLANEOUS) ×3 IMPLANT
SYR 3ML LL SCALE MARK (SYRINGE) ×3 IMPLANT
SYR TB 1ML LUER SLIP (SYRINGE) ×3 IMPLANT
WATER STERILE IRR 250ML POUR (IV SOLUTION) ×3 IMPLANT
WIPE NON LINTING 3.25X3.25 (MISCELLANEOUS) ×3 IMPLANT

## 2019-08-25 NOTE — H&P (Signed)
All labs reviewed. Abnormal studies sent to patients PCP when indicated.  Previous H&P reviewed, patient examined, there are NO CHANGES.  Jeanette Taussig Porfilio12/29/20207:52 AM

## 2019-08-25 NOTE — Anesthesia Preprocedure Evaluation (Signed)
Anesthesia Evaluation  Patient identified by MRN, date of birth, ID band Patient awake    Reviewed: Allergy & Precautions, NPO status , Patient's Chart, lab work & pertinent test results  History of Anesthesia Complications Negative for: history of anesthetic complications  Airway Mallampati: III  TM Distance: >3 FB Neck ROM: Limited    Dental   Pulmonary    breath sounds clear to auscultation       Cardiovascular hypertension, (-) angina(-) DOE  Rhythm:Regular Rate:Normal     Neuro/Psych    GI/Hepatic neg GERD  ,  Endo/Other  Hypothyroidism   Renal/GU      Musculoskeletal   Abdominal   Peds  Hematology   Anesthesia Other Findings Lymphoma Breast cancer   Reproductive/Obstetrics                             Anesthesia Physical  Anesthesia Plan  ASA: III  Anesthesia Plan: MAC   Post-op Pain Management:    Induction: Intravenous  PONV Risk Score and Plan: 2 and TIVA, Midazolam and Treatment may vary due to age or medical condition  Airway Management Planned: Nasal Cannula  Additional Equipment:   Intra-op Plan:   Post-operative Plan:   Informed Consent: I have reviewed the patients History and Physical, chart, labs and discussed the procedure including the risks, benefits and alternatives for the proposed anesthesia with the patient or authorized representative who has indicated his/her understanding and acceptance.       Plan Discussed with: CRNA and Anesthesiologist  Anesthesia Plan Comments:         Anesthesia Quick Evaluation

## 2019-08-25 NOTE — Anesthesia Procedure Notes (Signed)
Procedure Name: MAC Performed by: Lasondra Hodgkins, CRNA Pre-anesthesia Checklist: Patient identified, Emergency Drugs available, Suction available, Timeout performed and Patient being monitored Patient Re-evaluated:Patient Re-evaluated prior to induction Oxygen Delivery Method: Nasal cannula Placement Confirmation: positive ETCO2       

## 2019-08-25 NOTE — Anesthesia Postprocedure Evaluation (Signed)
Anesthesia Post Note  Patient: Jeanette Newton  Procedure(s) Performed: CATARACT EXTRACTION PHACO AND INTRAOCULAR LENS PLACEMENT (IOC) RIGHT 6.04 00:41.0 (Right Eye)     Patient location during evaluation: PACU Anesthesia Type: MAC Level of consciousness: awake Pain management: pain level controlled Vital Signs Assessment: post-procedure vital signs reviewed and stable Respiratory status: respiratory function stable Cardiovascular status: stable Postop Assessment: no apparent nausea or vomiting Anesthetic complications: no    Veda Canning

## 2019-08-25 NOTE — Op Note (Signed)
PREOPERATIVE DIAGNOSIS:  Nuclear sclerotic cataract of the right eye.   POSTOPERATIVE DIAGNOSIS:  H25.11 Cataract   OPERATIVE PROCEDURE:@   SURGEON:  Birder Robson, MD.   ANESTHESIA:  Anesthesiologist: Veda Canning, MD CRNA: Cameron Ali, CRNA  1.      Managed anesthesia care. 2.      0.13ml of Shugarcaine was instilled in the eye following the paracentesis.   COMPLICATIONS:  None.   TECHNIQUE:   Stop and chop   DESCRIPTION OF PROCEDURE:  The patient was examined and consented in the preoperative holding area where the aforementioned topical anesthesia was applied to the right eye and then brought back to the Operating Room where the right eye was prepped and draped in the usual sterile ophthalmic fashion and a lid speculum was placed. A paracentesis was created with the side port blade and the anterior chamber was filled with viscoelastic. A near clear corneal incision was performed with the steel keratome. A continuous curvilinear capsulorrhexis was performed with a cystotome followed by the capsulorrhexis forceps. Hydrodissection and hydrodelineation were carried out with BSS on a blunt cannula. The lens was removed in a stop and chop  technique and the remaining cortical material was removed with the irrigation-aspiration handpiece. The capsular bag was inflated with viscoelastic and the Technis [id number]  lens was placed in the capsular bag without complication. The remaining viscoelastic was removed from the eye with the irrigation-aspiration handpiece. The wounds were hydrated. The anterior chamber was flushed with BSS and the eye was inflated to physiologic pressure. The wounds were found to be water tight. The eye was dressed with Combigan and Polytrim The patient was given protective glasses to wear throughout the day and a shield with which to sleep tonight. The patient was also given drops with which to begin a drop regimen today and will follow-up with me in one day. Implant Name  Type Inv. Item Serial No. Manufacturer Lot No. LRB No. Used Action  LENS IOL DIOP 23.5 - [id number] Intraocular Lens LENS IOL DIOP 23.5 [id number]  Right 1 Implanted   Procedure(s): CATARACT EXTRACTION PHACO AND INTRAOCULAR LENS PLACEMENT (IOC) RIGHT 6.04 00:41.0 (Right)  Electronically signed: Birder Robson 08/25/2019 8:19 AM

## 2019-08-25 NOTE — Transfer of Care (Signed)
Immediate Anesthesia Transfer of Care Note  Patient: Jeanette Newton  Procedure(s) Performed: CATARACT EXTRACTION PHACO AND INTRAOCULAR LENS PLACEMENT (IOC) RIGHT 6.04 00:41.0 (Right Eye)  Patient Location: PACU  Anesthesia Type: MAC  Level of Consciousness: awake, alert  and patient cooperative  Airway and Oxygen Therapy: Patient Spontanous Breathing and Patient connected to supplemental oxygen  Post-op Assessment: Post-op Vital signs reviewed, Patient's Cardiovascular Status Stable, Respiratory Function Stable, Patent Airway and No signs of Nausea or vomiting  Post-op Vital Signs: Reviewed and stable  Complications: No apparent anesthesia complications

## 2019-08-26 ENCOUNTER — Encounter: Payer: Self-pay | Admitting: *Deleted

## 2019-10-12 ENCOUNTER — Other Ambulatory Visit: Payer: Self-pay

## 2019-10-12 ENCOUNTER — Other Ambulatory Visit
Admission: RE | Admit: 2019-10-12 | Discharge: 2019-10-12 | Disposition: A | Discharge status: Home / Self Care | Payer: Medicare Other | Source: Ambulatory Visit | Attending: Surgery | Admitting: Surgery

## 2019-10-12 ENCOUNTER — Encounter: Payer: Self-pay | Admitting: Surgery

## 2019-10-12 ENCOUNTER — Ambulatory Visit (INDEPENDENT_AMBULATORY_CARE_PROVIDER_SITE_OTHER): Payer: Medicare Other | Admitting: Surgery

## 2019-10-12 DIAGNOSIS — R1033 Periumbilical pain: Secondary | ICD-10-CM

## 2019-10-12 LAB — BUN: BUN: 22 mg/dL (ref 8–23)

## 2019-10-12 LAB — CREATININE, SERUM
Creatinine, Ser: 1.22 mg/dL — ABNORMAL HIGH (ref 0.44–1.00)
GFR calc Af Amer: 45 mL/min — ABNORMAL LOW (ref >60)
GFR calc non Af Amer: 39 mL/min — ABNORMAL LOW (ref >60)

## 2019-10-12 NOTE — Patient Instructions (Addendum)
Dr.Pabon discuss with patient to have a CT Abdomen Pelvis w Contrast. Patient is scheduled on 10/28/19 at 1:00pm arrival time is 12:15pm. Patient is not to have anything to eat or drink 4 hours prior to scan. Patient may pick up prep kit from [facility]. Dr.Pabon also discussed with patient that she may try a high-fiber diet and over the counter laxatives such as Miralax and Metamucil.  High-Fiber Diet Fiber, also called dietary fiber, is a type of carbohydrate that is found in fruits, vegetables, whole grains, and beans. A high-fiber diet can have many health benefits. Your health care provider may recommend a high-fiber diet to help:  Prevent constipation. Fiber can make your bowel movements more regular.  Lower your cholesterol.  Relieve the following conditions: ? Swelling of veins in the anus (hemorrhoids). ? Swelling and irritation (inflammation) of specific areas of the digestive tract (uncomplicated diverticulosis). ? A problem of the large intestine (colon) that sometimes causes pain and diarrhea (irritable bowel syndrome, IBS).  Prevent overeating as part of a weight-loss plan.  Prevent heart disease, type 2 diabetes, and certain cancers. What is my plan? The recommended daily fiber intake in grams (g) includes:  38 g for men age 28 or younger.  30 g for men over age 81.  22 g for women age 58 or younger.  21 g for women over age 69. You can get the recommended daily intake of dietary fiber by:  Eating a variety of fruits, vegetables, grains, and beans.  Taking a fiber supplement, if it is not possible to get enough fiber through your diet. What do I need to know about a high-fiber diet?  It is better to get fiber through food sources rather than from fiber supplements. There is not a lot of research about how effective supplements are.  Always check the fiber content on the nutrition facts label of any prepackaged food. Look for foods that contain 5 g of fiber  or more per serving.  Talk with a diet and nutrition specialist (dietitian) if you have questions about specific foods that are recommended or not recommended for your medical condition, especially if those foods are not listed below.  Gradually increase how much fiber you consume. If you increase your intake of dietary fiber too quickly, you may have bloating, cramping, or gas.  Drink plenty of water. Water helps you to digest fiber. What are tips for following this plan?  Eat a wide variety of high-fiber foods.  Make sure that half of the grains that you eat each day are whole grains.  Eat breads and cereals that are made with whole-grain flour instead of refined flour or white flour.  Eat brown rice, bulgur wheat, or millet instead of white rice.  Start the day with a breakfast that is high in fiber, such as a cereal that contains 5 g of fiber or more per serving.  Use beans in place of meat in soups, salads, and pasta dishes.  Eat high-fiber snacks, such as berries, raw vegetables, nuts, and popcorn.  Choose whole fruits and vegetables instead of processed forms like juice or sauce. What foods can I eat?  Fruits Berries. Pears. Apples. Oranges. Avocado. Prunes and raisins. Dried figs. Vegetables Sweet potatoes. Spinach. Kale. Artichokes. Cabbage. Broccoli. Cauliflower. Green peas. Carrots. Squash. Grains Whole-grain breads. Multigrain cereal. Oats and oatmeal. Brown rice. Barley. Bulgur wheat. [place]. Quinoa. Bran muffins. Popcorn. Rye wafer crackers. Meats and other proteins Navy, kidney, and pinto beans. Soybeans. Split peas.  Lentils. Nuts and seeds. Dairy Fiber-fortified yogurt. Beverages Fiber-fortified soy milk. Fiber-fortified orange juice. Other foods Fiber bars. The items listed above may not be a complete list of recommended foods and beverages. Contact a dietitian for more options. What foods are not recommended? Fruits Fruit juice. Cooked, strained  fruit. Vegetables Fried potatoes. Canned vegetables. Well-cooked vegetables. Grains White bread. Pasta made with refined flour. White rice. Meats and other proteins Fatty cuts of meat. Fried chicken or fried fish. Dairy Milk. Yogurt. Cream cheese. Sour cream. Fats and oils Butters. Beverages Soft drinks. Other foods Cakes and pastries. The items listed above may not be a complete list of foods and beverages to avoid. Contact a dietitian for more information. Summary  Fiber is a type of carbohydrate. It is found in fruits, vegetables, whole grains, and beans.  There are many health benefits of eating a high-fiber diet, such as preventing constipation, lowering blood cholesterol, helping with weight loss, and reducing your risk of heart disease, diabetes, and certain cancers.  Gradually increase your intake of fiber. Increasing too fast can result in cramping, bloating, and gas. Drink plenty of water while you increase your fiber.  The best sources of fiber include whole fruits and vegetables, whole grains, nuts, seeds, and beans. This information is not intended to replace advice given to you by your health care provider. Make sure you discuss any questions you have with your health care provider. Document Revised: 06/17/2017 Document Reviewed: 06/17/2017 Elsevier Patient Education  2020 [employer].

## 2019-10-12 NOTE — Progress Notes (Signed)
Outpatient Surgical Follow Up  10/12/2019  Jeanette Newton is an 86 y.o. female.   Chief Complaint  Patient presents with  . Follow-up    established patient/ new referral periumbilical hernia referred by Jolene Schimke PA    HPI: Jeanette Newton is an 86 year old female well-known to me with a prior history of perforated diverticulitis of the small bowel requiring small bowel resection.  She did have at that time a colotomy from extensive lysis of adhesions that was repaired primarily.  She now comes with a 1 week history of a bulge below her umbilicus.  She does have a history of constipation and had some nausea the other day.  No abdominal pain no fevers no chills she has been back to normal.  She is being able to walk without any shortness of breath or chest pain.  She is able to perform more than 4 METS of activity without any shortness of breath or chest pain.  There is no recent imaging studies at this time.  Last BMP was completely normal.  Last CBC also was normal except some mild anemia of hb 10.7. She did have supraumbilical and umbilical hernias before last recent surgery I personally reviewed last CT scan  Past Medical History:  Diagnosis Date  . Cancer [facility]) 2005-2006   lymphoma  . Hypothyroidism   . Status post chemoradiation    lymphoma  . Status post radiation therapy   . Thyroid disease   . Wears hearing aid in both ears     Past Surgical History:  Procedure Laterality Date  . ABDOMINAL EXPLORATION SURGERY  2005-2006   Dr Pat Patrick  . CATARACT EXTRACTION W/PHACO Left 07/28/2019   Procedure: CATARACT EXTRACTION PHACO AND INTRAOCULAR LENS PLACEMENT (IOC) LEFT 7.83  00:53.4;  Surgeon: Birder Robson, MD;  Location: [place];  Service: Ophthalmology;  Laterality: Left;  . CATARACT EXTRACTION W/PHACO Right 08/25/2019   Procedure: CATARACT EXTRACTION PHACO AND INTRAOCULAR LENS PLACEMENT (IOC) RIGHT 6.04 00:41.0;  Surgeon: Birder Robson, MD;  Location: [place];  Service: Ophthalmology;  Laterality: Right;  . CHOLECYSTECTOMY N/A 05/25/2016   Procedure: LAPAROSCOPIC CHOLECYSTECTOMY WITH INTRAOPERATIVE CHOLANGIOGRAM;  Surgeon: Robert Bellow, MD;  Location: [facility];  Service: General;  Laterality: N/A;  . LAPAROTOMY N/A 05/30/2019   Procedure: EXPLORATORY LAPAROTOMYand small bowel resection;  Surgeon: Jules Husbands, MD;  Location: [facility];  Service: General;  Laterality: N/A;    Family History  Problem Relation Age of Onset  . Breast cancer Neg Hx     Social History:  reports that she has never smoked. She has never used smokeless tobacco. She reports that she does not drink alcohol or use drugs.  Allergies:  Allergies  Allergen Reactions  . Clarithromycin Nausea Only    Biaxin  . Clindamycin Nausea Only  . Ciprofloxacin Swelling and Rash    Pt states she can take this medication  . Doxycycline Hyclate Rash  . Etodolac Rash  . Levaquin [Levofloxacin In D5w] Rash  . Penicillins Rash  . Propranolol Itching and Rash    Medications reviewed.    ROS Full ROS performed and is otherwise negative other than what is stated in HPI   BP (!) 166/84   Pulse 76   Temp 97.7 F (36.5 C) (Temporal)   Resp 12   Ht 5\' 2"  (1.575 m)   Wt 135 lb 3.2 oz (61.3 kg)   SpO2 98%   BMI 24.73 kg/m   Physical Exam Vitals and  nursing note reviewed. Exam conducted with a chaperone present.  Constitutional:      General: She is not in acute distress.    Appearance: Normal appearance. She is normal weight.  Eyes:     General:        Right eye: No discharge.        Left eye: No discharge.     Extraocular Movements: Extraocular movements intact.     Pupils: Pupils are equal, round, and reactive to light.  Cardiovascular:     Rate and Rhythm: Normal rate.  Pulmonary:     Effort: Pulmonary effort is normal. No respiratory distress.     Breath sounds: No wheezing.  Abdominal:     General: Abdomen is flat. There is no distension.      Palpations: There is no mass.     Tenderness: There is no abdominal tenderness. There is no rebound.     Hernia: A hernia is present.     Comments: Infraumbilical hernia measures 3.5 cm  Musculoskeletal:        General: Normal range of motion.     Cervical back: Normal range of motion and neck supple. No rigidity or tenderness.  Skin:    General: Skin is warm and dry.     Capillary Refill: Capillary refill takes less than 2 seconds.  Neurological:     General: No focal deficit present.     Mental Status: She is alert and oriented to person, place, and time.  Psychiatric:        Mood and Affect: Mood normal.        Behavior: Behavior normal.        Thought Content: Thought content normal.        Judgment: Judgment normal.     Assessment/Plan:  86 year old female with small ventral hernia.  We will obtain a CT scan of the abdomen and pelvis to delineate the abdominal wall anatomy and entertain the feasibility of a robotic approach.  I do think that based on physical exam she will be a good candidate for robotic repair. Extensive counseling provided.  There is no need for emergent surgical intervention at this time. Greater than 50% of the 40 minutes  visit was spent in counseling/coordination of care   Caroleen Hamman, MD [place] Surgeon

## 2019-10-28 ENCOUNTER — Other Ambulatory Visit: Payer: Self-pay

## 2019-10-28 ENCOUNTER — Ambulatory Visit
Admission: RE | Admit: 2019-10-28 | Discharge: 2019-10-28 | Disposition: A | Discharge status: Home / Self Care | Payer: Medicare Other | Source: Ambulatory Visit | Attending: Surgery | Admitting: Surgery

## 2019-10-28 DIAGNOSIS — R1033 Periumbilical pain: Secondary | ICD-10-CM

## 2019-10-28 IMAGING — CT CT ABD-PELV W/ CM
2 of 5 series · 16 of 46 positions shown, 18 images · IV contrast (APPLIED)
Comparison: None.

CLINICAL DATA: Abdominal pain.  Hernia suspected

EXAM:
CT ABDOMEN AND PELVIS WITH CONTRAST
TECHNIQUE: Multidetector CT imaging of the abdomen and pelvis was performed
using the standard protocol following bolus administration of
intravenous contrast.
CONTRAST:  75mL OMNIPAQUE IOHEXOL 300 MG/ML  SOLN

[Series 2: routine abd/pel with · axial · 0.75mm/px · z∈[-896,-506]mm · 13 of 88 slices shown, 15 images]
[im 5/88  soft-tissue]
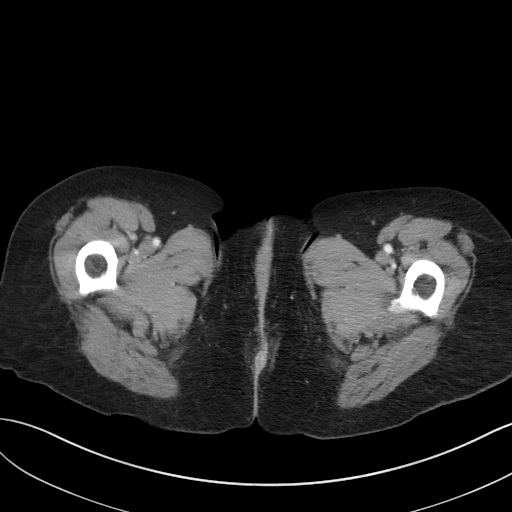
[im 5/88  bone]
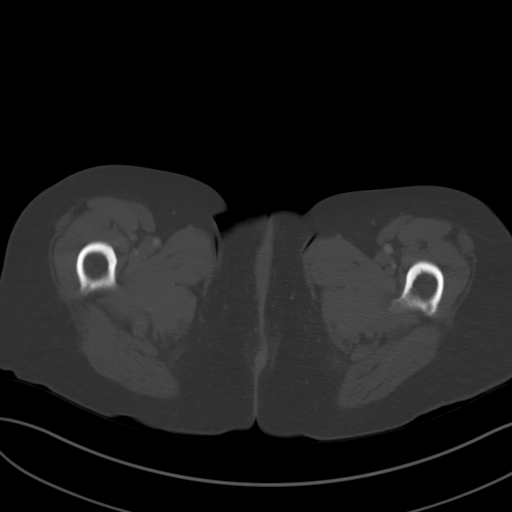
[im 14/88  soft-tissue]
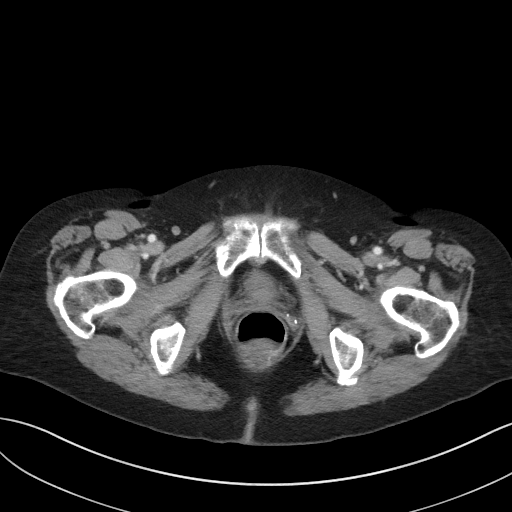
[im 19/88  soft-tissue]
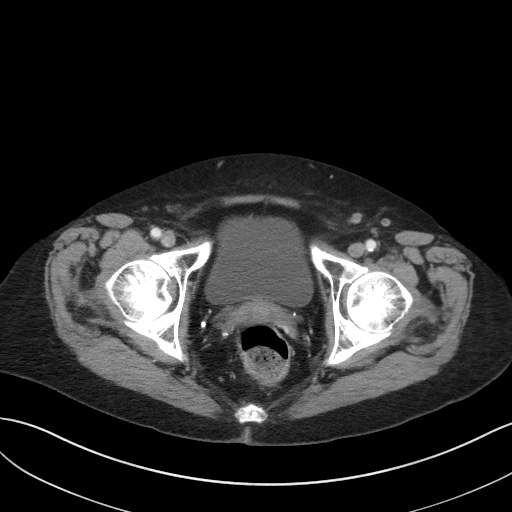
[im 23/88  soft-tissue]
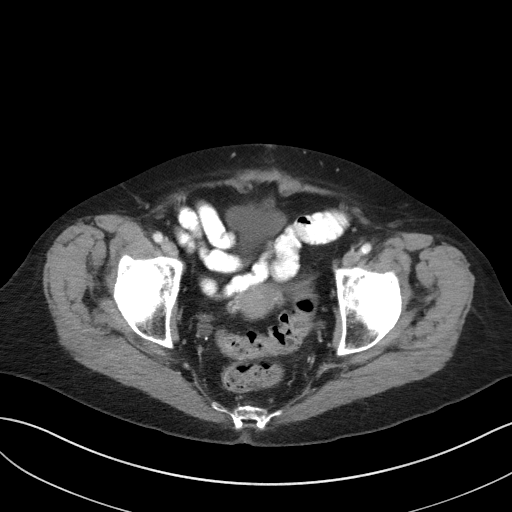
[im 33/88  soft-tissue]
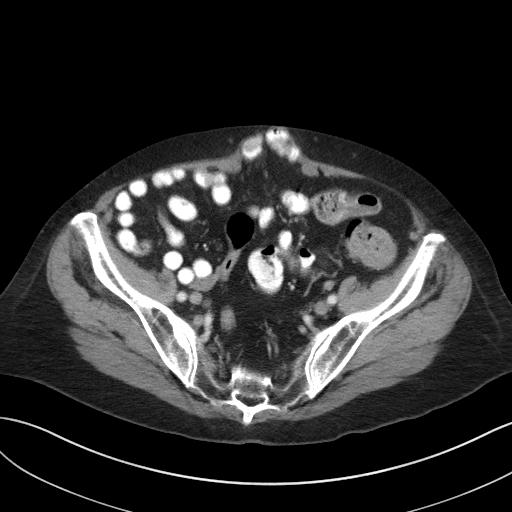
[im 37/88  soft-tissue]
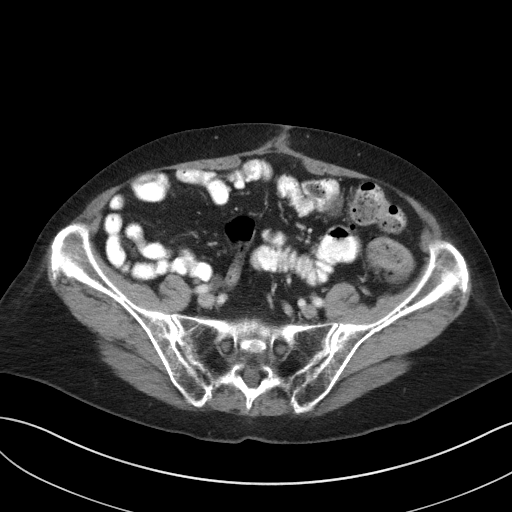
[im 46/88  soft-tissue]
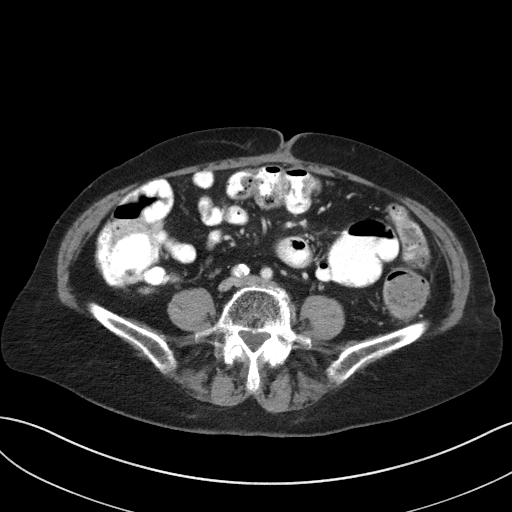
[im 51/88  soft-tissue]
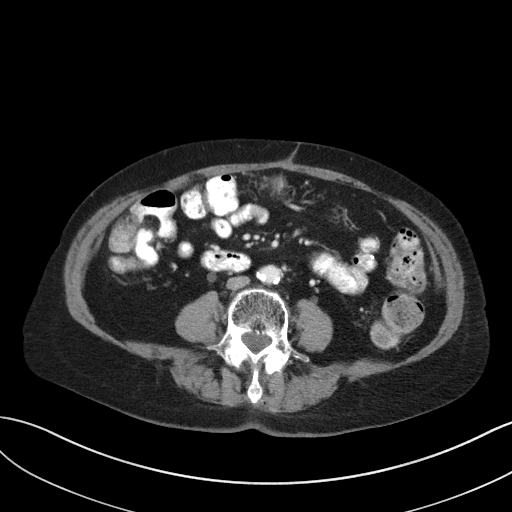
[im 55/88  soft-tissue]
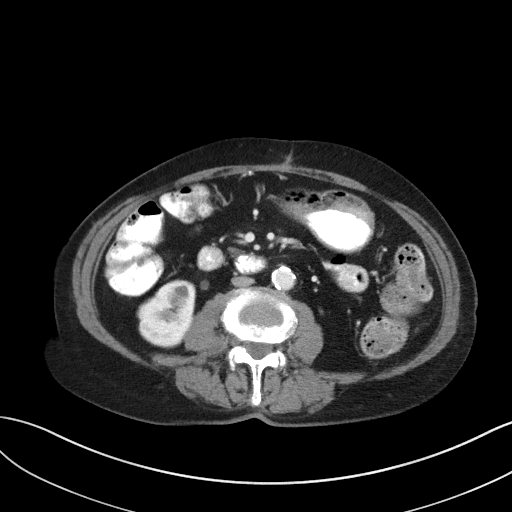
[im 55/88  bone]
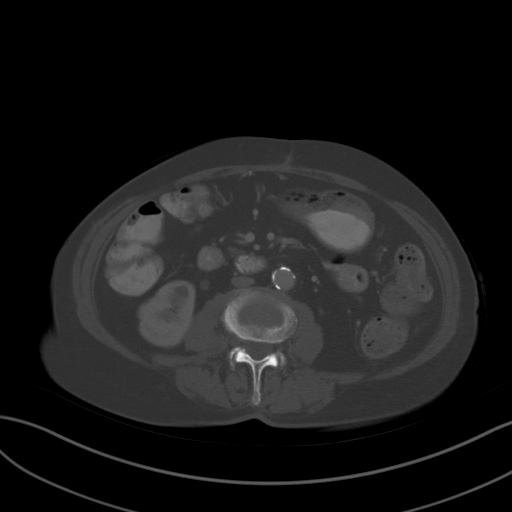
[im 65/88  soft-tissue]
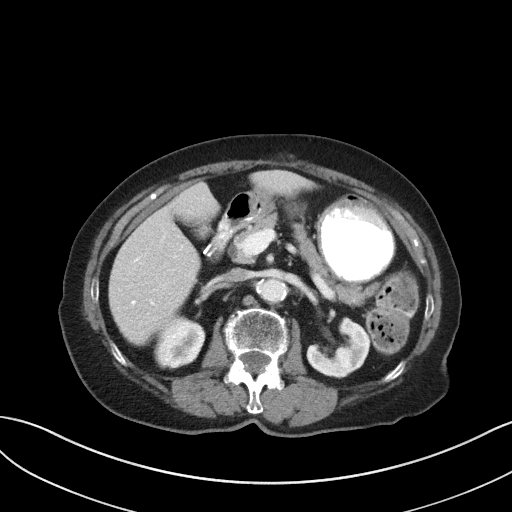
[im 69/88  soft-tissue]
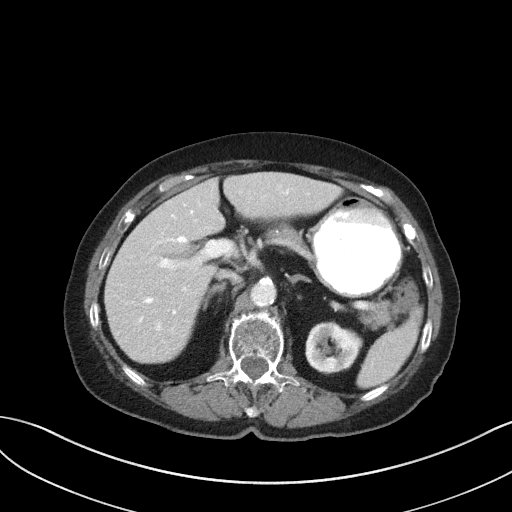
[im 74/88  soft-tissue]
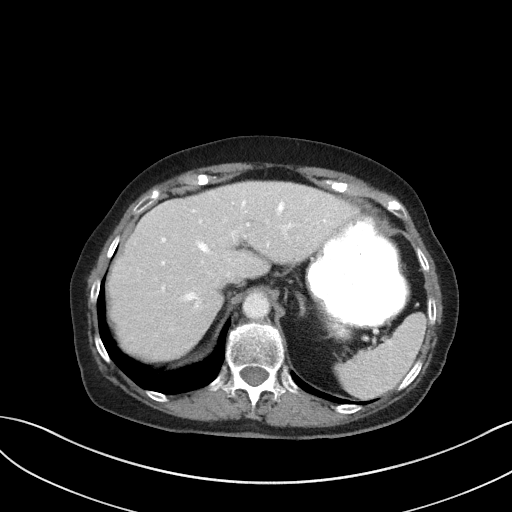
[im 83/88  soft-tissue]
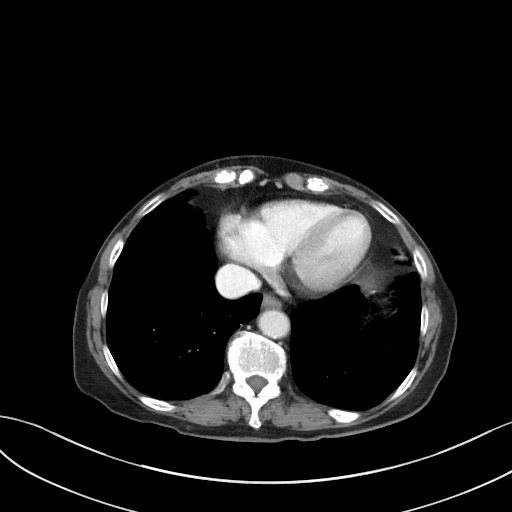

[Series 5: coronal st · coronal · 0.67mm/px · 3 of 82 slices shown]
[im 28/82  soft-tissue]
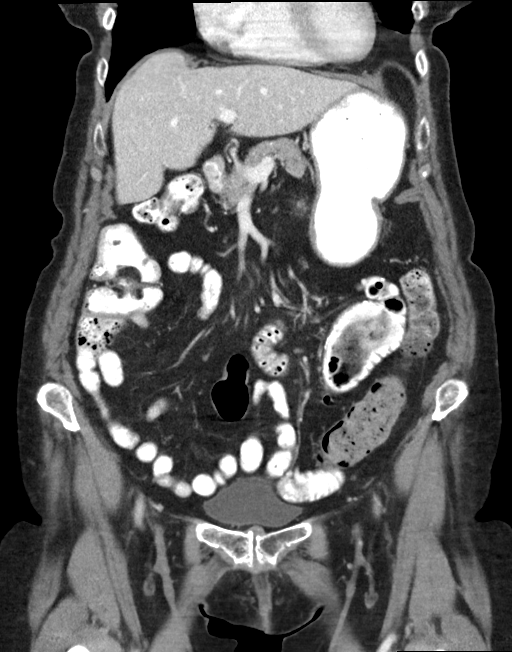
[im 37/82  soft-tissue]
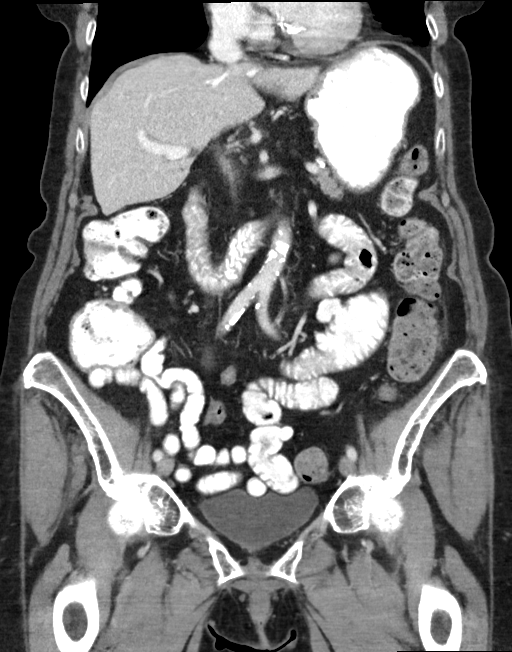
[im 46/82  soft-tissue]
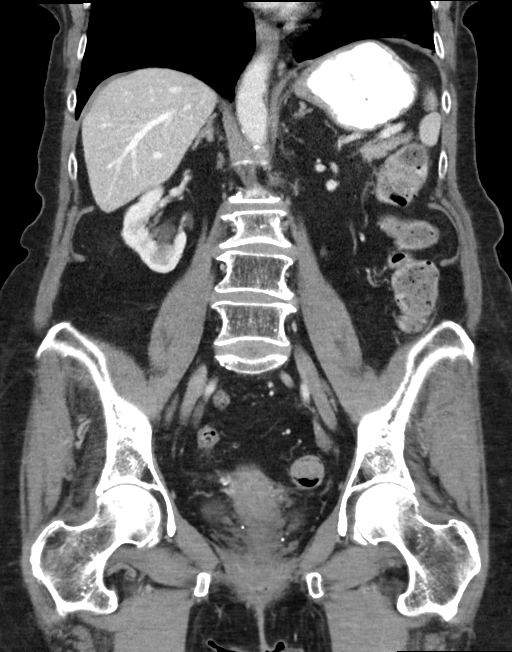

[16 of 46 positions shown; findings below may reference images not displayed]

FINDINGS: Lower chest: Lung bases are clear.

Hepatobiliary: No focal hepatic lesion. Postcholecystectomy. No
biliary dilatation.

Pancreas: Pancreas is normal. No ductal dilatation. No pancreatic
inflammation.

Spleen: Normal spleen

Adrenals/urinary tract: Adrenal glands and kidneys are normal. The
ureters and bladder normal.

Stomach/Bowel: Stomach, small bowel, appendix, and cecum are normal.
The colon and rectosigmoid colon are normal.

Vascular/Lymphatic: Abdominal aorta is normal caliber with
atherosclerotic calcification. There is no retroperitoneal or
periportal lymphadenopathy. No pelvic lymphadenopathy.

Reproductive: Uterus and necks are normal.

Other: Midline ventral hernia several cm below the umbilicus. The
hernia is wide-mouth measuring 4 cm in diameter. Loop of small bowel
enters and exits the hernia sac without obstruction.

Musculoskeletal: No aggressive osseous lesion.
IMPRESSION: 1. Wide-mouth ventral hernia contains a nonobstructed loop of small
bowel.
2. Postcholecystectomy.
3. Normal appendix.
4.  Aortic Atherosclerosis ([6O]-[6O]).

## 2019-10-28 MED ORDER — IOHEXOL 300 MG/ML  SOLN
75.0000 mL | Freq: Once | INTRAMUSCULAR | Status: AC | PRN
  Administered 2019-10-28: 75 mL via INTRAVENOUS

## 2019-10-29 ENCOUNTER — Telehealth: Payer: Self-pay

## 2019-10-29 NOTE — Telephone Encounter (Signed)
Per Dr.Pabon "Please let the pt know that CT shows known hernia but no other significant findings" patient's daughter was notified and verbalizes understanding. Patient's daughter states they will see Dr.Pabon at patient's scheduled appointment 11/02/19 at 2:30pm.

## 2019-10-29 NOTE — Telephone Encounter (Signed)
-----   Message from Jules Husbands, MD sent at 10/29/2019 12:36 AM EST ----- Please let the pt know that CT shows known hernia but no other significant findings ----- Message ----- From: Interface, Rad Results In Sent: 10/28/2019   8:58 PM EST To: Jules Husbands, MD

## 2019-11-02 ENCOUNTER — Ambulatory Visit (INDEPENDENT_AMBULATORY_CARE_PROVIDER_SITE_OTHER): Payer: Medicare Other | Admitting: Surgery

## 2019-11-02 ENCOUNTER — Other Ambulatory Visit: Payer: Self-pay

## 2019-11-02 ENCOUNTER — Encounter: Payer: Self-pay | Admitting: Surgery

## 2019-11-02 VITALS — BP 162/70 | HR 71 | Temp 97.2°F | Resp 12 | Ht 62.0 in | Wt 135.6 lb

## 2019-11-02 DIAGNOSIS — K432 Incisional hernia without obstruction or gangrene: Secondary | ICD-10-CM

## 2019-11-02 NOTE — Patient Instructions (Signed)
Our surgery scheduler will contact you to schedule your surgery. Please have the BLUE SHEET available when she calls you.   Please call the office if you have any questions or concerns.   Ventral Hernia  A ventral hernia is a bulge of tissue from inside the abdomen that pushes through a weak area of the muscles that form the front wall of the abdomen. The tissues inside the abdomen are inside a sac (peritoneum). These tissues include the small intestine, large intestine, and the fatty tissue that covers the intestines (omentum). Sometimes, the bulge that forms a hernia contains intestines. Other hernias contain only fat. Ventral hernias do not go away without surgical treatment. There are several types of ventral hernias. You may have:  A hernia at an incision site from previous abdominal surgery (incisional hernia).  A hernia just above the belly button (epigastric hernia), or at the belly button (umbilical hernia). These types of hernias can develop from heavy lifting or straining.  A hernia that comes and goes (reducible hernia). It may be visible only when you lift or strain. This type of hernia can be pushed back into the abdomen (reduced).  A hernia that traps abdominal tissue inside the hernia (incarcerated hernia). This type of hernia does not reduce.  A hernia that cuts off blood flow to the tissues inside the hernia (strangulated hernia). The tissues can start to die if this happens. This is a very painful bulge that cannot be reduced. A strangulated hernia is a medical emergency. What are the causes? This condition is caused by abdominal tissue putting pressure on an area of weakness in the abdominal muscles. What increases the risk? The following factors may make you more likely to develop this condition:  Being female.  Being 78 or older.  Being overweight or obese.  Having had previous abdominal surgery, especially if there was an infection after surgery.  Having had an  injury to the abdominal wall.  Having had several pregnancies.  Having a buildup of fluid inside the abdomen (ascites). What are the signs or symptoms? The only symptom of a ventral hernia may be a painless bulge in the abdomen. A reducible hernia may be visible only when you strain, cough, or lift. Other symptoms may include:  Dull pain.  A feeling of pressure. Signs and symptoms of a strangulated hernia may include:  Increasing pain.  Nausea and vomiting.  Pain when pressing on the hernia.  The skin over the hernia turning red or purple.  Constipation.  Blood in the stool (feces). How is this diagnosed? This condition may be diagnosed based on:  Your symptoms.  Your medical history.  A physical exam. You may be asked to cough or strain while standing. These actions increase the pressure inside your abdomen and force the hernia through the opening in your muscles. Your health care provider may try to reduce the hernia by pressing on it.  Imaging studies, such as an ultrasound or CT scan. How is this treated? This condition is treated with surgery. If you have a strangulated hernia, surgery is done as soon as possible. If your hernia is small and not incarcerated, you may be asked to lose some weight before surgery. Follow these instructions at home:  Follow instructions from your health care provider about eating or drinking restrictions.  If you are overweight, your health care provider may recommend that you increase your activity level and eat a healthier diet.  Do not lift anything that is heavier  than 10 lb (4.5 kg).  Return to your normal activities as told by your health care provider. Ask your health care provider what activities are safe for you. You may need to avoid activities that increase pressure on your hernia.  Take over-the-counter and prescription medicines only as told by your health care provider.  Keep all follow-up visits as told by your health  care provider. This is important. Contact a health care provider if:  Your hernia gets larger.  Your hernia becomes painful. Get help right away if:  Your hernia becomes increasingly painful.  You have pain along with any of the following: ? Changes in skin color in the area of the hernia. ? Nausea. ? Vomiting. ? Fever. Summary  A ventral hernia is a bulge of tissue from inside the abdomen that pushes through a weak area of the muscles that form the front wall of the abdomen.  This condition is treated with surgery, which may be urgent depending on your hernia.  Do not lift anything that is heavier than 10 lb (4.5 kg), and follow activity instructions from your health care provider. This information is not intended to replace advice given to you by your health care provider. Make sure you discuss any questions you have with your health care provider. Document Revised: 09/25/2017 Document Reviewed: 03/04/2017 Elsevier Patient Education  [employer].

## 2019-11-03 ENCOUNTER — Encounter
Admission: RE | Admit: 2019-11-03 | Discharge: 2019-11-03 | Disposition: A | Discharge status: Home / Self Care | Payer: Medicare Other | Source: Ambulatory Visit | Attending: Surgery | Admitting: Surgery

## 2019-11-03 ENCOUNTER — Other Ambulatory Visit: Payer: Self-pay

## 2019-11-03 ENCOUNTER — Telehealth: Payer: Self-pay | Admitting: Surgery

## 2019-11-03 MED ORDER — ONDANSETRON HCL 4 MG/2ML IJ SOLN
4.0000 mg | Freq: Once | INTRAMUSCULAR | Status: DC | PRN
  Filled 2019-11-03: qty 2

## 2019-11-03 NOTE — Telephone Encounter (Signed)
Pt has been advised of pre admission date/time, Covid Testing date and Surgery date.  Surgery Date: 11/06/19 Preadmission Testing Date: 11/04/19 (phone 1p-5p) Covid Testing Date: 11/05/19 - patient advised to go to the [facility] ([place])  Patient has been made aware to call [phone], between 1-3:00pm the day before surgery, to find out what time to arrive.

## 2019-11-03 NOTE — Patient Instructions (Addendum)
Your procedure is scheduled on: Friday, March 12 Report to Day Surgery on the 2nd floor of the [facility]. To find out your arrival time, please call [phone] between 1PM - 3PM on: Thursday, March 11  REMEMBER: Instructions that are not followed completely may result in serious medical risk, up to and including death; or upon the discretion of your surgeon and anesthesiologist your surgery may need to be rescheduled.  Do not eat food after midnight the night before surgery.  No gum chewing, lozengers or hard candies.  You may however, drink CLEAR liquids up to 2 hours before you are scheduled to arrive for your surgery. Do not drink anything within 2 hours of the start of your surgery.  Clear liquids include: - water  - apple juice without pulp - gatorade (not RED) - black coffee or tea (Do NOT add milk or creamers to the coffee or tea) Do NOT drink anything that is not on this list.  TAKE THESE MEDICATIONS THE MORNING OF SURGERY WITH A SIP OF WATER:  1.  Levothyroxine  Stop aspirin and Anti-inflammatories (NSAIDS) such as Advil, Aleve, Ibuprofen, Motrin, Naproxen, Naprosyn and Aspirin based products such as Excedrin, Goodys Powder, BC Powder. (May take Tylenol or Acetaminophen if needed.)  Stop ANY OVER THE COUNTER supplements until after surgery. (fish oil) (May continue Vitamin D, Vitamin B, and multivitamin.)  No Alcohol for 24 hours before or after surgery.  On the morning of surgery brush your teeth with toothpaste and water, you may rinse your mouth with mouthwash if you wish. Do not swallow any toothpaste or mouthwash.  Do not wear jewelry, make-up, hairpins, clips or nail polish.  Do not wear lotions, powders, or perfumes.   Do not shave 48 hours prior to surgery.   Contact lenses, hearing aids and dentures may not be worn into surgery.  Do not bring valuables to the hospital, including drivers license, insurance or credit cards.  [facility] is not  responsible for any belongings or valuables.   Use CHG Soap as directed on instruction sheet.  Notify your doctor if there is any change in your medical condition (cold, fever, infection).  Wear comfortable clothing (specific to your surgery type) to the hospital.  Plan for stool softeners for home use.  If you are being discharged the day of surgery, you will not be allowed to drive home. You will need a responsible adult to drive you home and stay with you that night.   If you are taking public transportation, you will need to have a responsible adult with you. Please confirm with your physician that it is acceptable to use public transportation.   Please call [phone] if you have any questions about these instructions.  Visitation Policy:  Patients undergoing a surgery or procedure in a hospital may have one family member or support person with them as long as that person is not COVID-19 positive or experiencing its symptoms. That person may remain in the waiting area during the procedure. Should the patient need to stay at the hospital during part of their recovery, the support person may visit during visiting hours; 10 am to 8 pm.

## 2019-11-04 ENCOUNTER — Other Ambulatory Visit
Admission: RE | Admit: 2019-11-04 | Discharge: 2019-11-04 | Disposition: A | Discharge status: Home / Self Care | Payer: Medicare Other | Source: Ambulatory Visit | Attending: Surgery | Admitting: Surgery

## 2019-11-04 ENCOUNTER — Encounter: Payer: Self-pay | Admitting: Surgery

## 2019-11-04 DIAGNOSIS — Z01812 Encounter for preprocedural laboratory examination: Secondary | ICD-10-CM | POA: Diagnosis present

## 2019-11-04 DIAGNOSIS — Z20822 Contact with and (suspected) exposure to covid-19: Secondary | ICD-10-CM | POA: Diagnosis not present

## 2019-11-04 LAB — BASIC METABOLIC PANEL
Anion gap: 11 (ref 5–15)
BUN: 18 mg/dL (ref 8–23)
CO2: 26 mmol/L (ref 22–32)
Calcium: 8.9 mg/dL (ref 8.9–10.3)
Chloride: 103 mmol/L (ref 98–111)
Creatinine, Ser: 1.41 mg/dL — ABNORMAL HIGH (ref 0.44–1.00)
GFR calc Af Amer: 38 mL/min — ABNORMAL LOW (ref >60)
GFR calc non Af Amer: 33 mL/min — ABNORMAL LOW (ref >60)
Glucose, Bld: 124 mg/dL — ABNORMAL HIGH (ref 70–99)
Potassium: 3.5 mmol/L (ref 3.5–5.1)
Sodium: 140 mmol/L (ref 135–145)

## 2019-11-04 LAB — CBC
HCT: 41.4 % (ref 36.0–46.0)
Hemoglobin: 13.5 g/dL (ref 12.0–15.0)
MCH: 29.3 pg (ref 26.0–34.0)
MCHC: 32.6 g/dL (ref 30.0–36.0)
MCV: 90 fL (ref 80.0–100.0)
Platelets: 221 10*3/uL (ref 150–400)
RBC: 4.6 MIL/uL (ref 3.87–5.11)
RDW: 14.2 % (ref 11.5–15.5)
WBC: 8.3 10*3/uL (ref 4.0–10.5)
nRBC: 0 % (ref 0.0–0.2)

## 2019-11-04 LAB — SARS CORONAVIRUS 2 (TAT 6-24 HRS): SARS Coronavirus 2: NEGATIVE

## 2019-11-04 NOTE — H&P (View-Only) (Signed)
Outpatient Surgical Follow Up  11/04/2019  Mariyam MASON NACKE is an 86 y.o. female.   Chief Complaint  Patient presents with  . Follow-up    Periumbilical hernia-discuss CT scan     HPI: Ms. Berte is an 86 year old female well-known to me with a prior history of perforated diverticulitis of the small bowel requiring small bowel resection.  She did have at that time a colotomy from extensive lysis of adhesions that was repaired primarily.  She now comes with a 1 week history of a bulge below her umbilicus.  She does have a history of constipation and had some nausea the other day.  No abdominal pain no fevers no chills she has been back to normal.  She is being able to walk without any shortness of breath or chest pain.  She is able to perform more than 4 METS of activity without any shortness of breath or chest pain.   Ordered a CT scan of the abdomen and pelvis that I have personally reviewed showing evidence of a ventral hernia defect measuring about 4 cm infraumbilical area.  No evidence of incarceration or strangulation.  The patient denies any recent bowel obstruction or evidence of any strangulation or incarceration.   Past Medical History:  Diagnosis Date  . Cancer [facility]) 2005-2006   lymphoma  . Hypothyroidism   . Status post chemoradiation    lymphoma  . Status post radiation therapy   . Thyroid disease   . Wears hearing aid in both ears     Past Surgical History:  Procedure Laterality Date  . ABDOMINAL EXPLORATION SURGERY  2005-2006   Dr Pat Patrick  . CATARACT EXTRACTION W/PHACO Left 07/28/2019   Procedure: CATARACT EXTRACTION PHACO AND INTRAOCULAR LENS PLACEMENT (IOC) LEFT 7.83  00:53.4;  Surgeon: Birder Robson, MD;  Location: [place];  Service: Ophthalmology;  Laterality: Left;  . CATARACT EXTRACTION W/PHACO Right 08/25/2019   Procedure: CATARACT EXTRACTION PHACO AND INTRAOCULAR LENS PLACEMENT (IOC) RIGHT 6.04 00:41.0;  Surgeon: Birder Robson, MD;  Location:  [place];  Service: Ophthalmology;  Laterality: Right;  . CHOLECYSTECTOMY N/A 05/25/2016   Procedure: LAPAROSCOPIC CHOLECYSTECTOMY WITH INTRAOPERATIVE CHOLANGIOGRAM;  Surgeon: Robert Bellow, MD;  Location: [facility];  Service: General;  Laterality: N/A;  . ELBOW FRACTURE SURGERY Right   . EYE SURGERY    . LAPAROTOMY N/A 05/30/2019   Procedure: EXPLORATORY LAPAROTOMYand small bowel resection;  Surgeon: Jules Husbands, MD;  Location: [facility];  Service: General;  Laterality: N/A;    Family History  Problem Relation Age of Onset  . Breast cancer Neg Hx     Social History:  reports that she has never smoked. She has never used smokeless tobacco. She reports that she does not drink alcohol or use drugs.  Allergies:  Allergies  Allergen Reactions  . Clarithromycin Nausea Only    Biaxin  . Clindamycin Nausea Only  . Ciprofloxacin Swelling and Rash    Pt states she can take this medication  . Doxycycline Hyclate Rash  . Etodolac Rash  . Levaquin [Levofloxacin In D5w] Rash  . Penicillins Rash  . Propranolol Itching and Rash    Medications reviewed.    ROS Full ROS performed and is otherwise negative other than what is stated in HPI   BP (!) 162/70   Pulse 71   Temp (!) 97.2 F (36.2 C)   Resp 12   Ht 5\' 2"  (1.575 m)   Wt 135 lb 9.6 oz (61.5 kg)  SpO2 98%   BMI 24.80 kg/m   Physical Exam Vitals and nursing note reviewed. Exam conducted with a chaperone present.  Constitutional:      General: She is not in acute distress.    Appearance: Normal appearance. She is normal weight.  Eyes:     General: No scleral icterus.       Right eye: No discharge.        Left eye: No discharge.  Cardiovascular:     Rate and Rhythm: Normal rate and regular rhythm.     Pulses: Normal pulses.  Pulmonary:     Effort: Pulmonary effort is normal. No respiratory distress.     Breath sounds: Normal breath sounds. No stridor.  Abdominal:     General: Abdomen is flat. There is  no distension.     Palpations: Abdomen is soft. There is no mass.     Tenderness: There is no right CVA tenderness or rebound.     Hernia: A hernia is present.     Comments: 4 cm hernia infraumbilical, reducible  Musculoskeletal:        General: No swelling or tenderness. Normal range of motion.     Cervical back: Normal range of motion and neck supple. No rigidity or tenderness.  Skin:    General: Skin is warm and dry.     Capillary Refill: Capillary refill takes less than 2 seconds.  Neurological:     General: No focal deficit present.     Mental Status: She is alert and oriented to person, place, and time.  Psychiatric:        Mood and Affect: Mood normal.        Behavior: Behavior normal.        Thought Content: Thought content normal.        Judgment: Judgment normal.    Assessment/Plan: 86 year old with a prior laparotomy for perforated small bowel diverticulitis requiring small bowel resection now developed a ventral hernia.  And she is very functional.  I have evaluated all the imaging studies and her overall condition and I do think that she is a good operative candidate.  I do think that she will benefit from a robotic approach.  And discussed with the patient in detail about the proposed surgery and she is in agreement.  Risk, benefits and possible complications discussed with the patient in detail.  Risk of infection, mesh issues, bowel injuries, reintervention and chronic pain were specifically discussed during this visit. I do think that from the medical perspective she is optimized and does not require any further work-up.  Greater than 50% of the 40 minutes  visit was spent in counseling/coordination of care   Caroleen Hamman, MD [place] Surgeon

## 2019-11-04 NOTE — Progress Notes (Signed)
Outpatient Surgical Follow Up  11/04/2019  Shatia LAURIN SAWATZKY is an 86 y.o. female.   Chief Complaint  Patient presents with  . Follow-up    Periumbilical hernia-discuss CT scan     HPI: Ms. Kaczynski is an 86 year old female well-known to me with a prior history of perforated diverticulitis of the small bowel requiring small bowel resection.  She did have at that time a colotomy from extensive lysis of adhesions that was repaired primarily.  She now comes with a 1 week history of a bulge below her umbilicus.  She does have a history of constipation and had some nausea the other day.  No abdominal pain no fevers no chills she has been back to normal.  She is being able to walk without any shortness of breath or chest pain.  She is able to perform more than 4 METS of activity without any shortness of breath or chest pain.   Ordered a CT scan of the abdomen and pelvis that I have personally reviewed showing evidence of a ventral hernia defect measuring about 4 cm infraumbilical area.  No evidence of incarceration or strangulation.  The patient denies any recent bowel obstruction or evidence of any strangulation or incarceration.   Past Medical History:  Diagnosis Date  . Cancer [facility]) 2005-2006   lymphoma  . Hypothyroidism   . Status post chemoradiation    lymphoma  . Status post radiation therapy   . Thyroid disease   . Wears hearing aid in both ears     Past Surgical History:  Procedure Laterality Date  . ABDOMINAL EXPLORATION SURGERY  2005-2006   Dr Pat Patrick  . CATARACT EXTRACTION W/PHACO Left 07/28/2019   Procedure: CATARACT EXTRACTION PHACO AND INTRAOCULAR LENS PLACEMENT (IOC) LEFT 7.83  00:53.4;  Surgeon: Birder Robson, MD;  Location: [place];  Service: Ophthalmology;  Laterality: Left;  . CATARACT EXTRACTION W/PHACO Right 08/25/2019   Procedure: CATARACT EXTRACTION PHACO AND INTRAOCULAR LENS PLACEMENT (IOC) RIGHT 6.04 00:41.0;  Surgeon: Birder Robson, MD;  Location:  [place];  Service: Ophthalmology;  Laterality: Right;  . CHOLECYSTECTOMY N/A 05/25/2016   Procedure: LAPAROSCOPIC CHOLECYSTECTOMY WITH INTRAOPERATIVE CHOLANGIOGRAM;  Surgeon: Robert Bellow, MD;  Location: [facility];  Service: General;  Laterality: N/A;  . ELBOW FRACTURE SURGERY Right   . EYE SURGERY    . LAPAROTOMY N/A 05/30/2019   Procedure: EXPLORATORY LAPAROTOMYand small bowel resection;  Surgeon: Jules Husbands, MD;  Location: [facility];  Service: General;  Laterality: N/A;    Family History  Problem Relation Age of Onset  . Breast cancer Neg Hx     Social History:  reports that she has never smoked. She has never used smokeless tobacco. She reports that she does not drink alcohol or use drugs.  Allergies:  Allergies  Allergen Reactions  . Clarithromycin Nausea Only    Biaxin  . Clindamycin Nausea Only  . Ciprofloxacin Swelling and Rash    Pt states she can take this medication  . Doxycycline Hyclate Rash  . Etodolac Rash  . Levaquin [Levofloxacin In D5w] Rash  . Penicillins Rash  . Propranolol Itching and Rash    Medications reviewed.    ROS Full ROS performed and is otherwise negative other than what is stated in HPI   BP (!) 162/70   Pulse 71   Temp (!) 97.2 F (36.2 C)   Resp 12   Ht 5\' 2"  (1.575 m)   Wt 135 lb 9.6 oz (61.5 kg)  SpO2 98%   BMI 24.80 kg/m   Physical Exam Vitals and nursing note reviewed. Exam conducted with a chaperone present.  Constitutional:      General: She is not in acute distress.    Appearance: Normal appearance. She is normal weight.  Eyes:     General: No scleral icterus.       Right eye: No discharge.        Left eye: No discharge.  Cardiovascular:     Rate and Rhythm: Normal rate and regular rhythm.     Pulses: Normal pulses.  Pulmonary:     Effort: Pulmonary effort is normal. No respiratory distress.     Breath sounds: Normal breath sounds. No stridor.  Abdominal:     General: Abdomen is flat. There is  no distension.     Palpations: Abdomen is soft. There is no mass.     Tenderness: There is no right CVA tenderness or rebound.     Hernia: A hernia is present.     Comments: 4 cm hernia infraumbilical, reducible  Musculoskeletal:        General: No swelling or tenderness. Normal range of motion.     Cervical back: Normal range of motion and neck supple. No rigidity or tenderness.  Skin:    General: Skin is warm and dry.     Capillary Refill: Capillary refill takes less than 2 seconds.  Neurological:     General: No focal deficit present.     Mental Status: She is alert and oriented to person, place, and time.  Psychiatric:        Mood and Affect: Mood normal.        Behavior: Behavior normal.        Thought Content: Thought content normal.        Judgment: Judgment normal.    Assessment/Plan: 86 year old with a prior laparotomy for perforated small bowel diverticulitis requiring small bowel resection now developed a ventral hernia.  And she is very functional.  I have evaluated all the imaging studies and her overall condition and I do think that she is a good operative candidate.  I do think that she will benefit from a robotic approach.  And discussed with the patient in detail about the proposed surgery and she is in agreement.  Risk, benefits and possible complications discussed with the patient in detail.  Risk of infection, mesh issues, bowel injuries, reintervention and chronic pain were specifically discussed during this visit. I do think that from the medical perspective she is optimized and does not require any further work-up.  Greater than 50% of the 40 minutes  visit was spent in counseling/coordination of care   Caroleen Hamman, MD [place] Surgeon

## 2019-11-06 ENCOUNTER — Encounter: Payer: Self-pay | Admitting: Surgery

## 2019-11-06 ENCOUNTER — Ambulatory Visit: Payer: Medicare Other | Admitting: Anesthesiology

## 2019-11-06 ENCOUNTER — Encounter
Admission: RE | Disposition: A | Discharge status: Home / Self Care | Payer: Self-pay | Source: Home / Self Care | Attending: Surgery

## 2019-11-06 ENCOUNTER — Ambulatory Visit
Admission: RE | Admit: 2019-11-06 | Discharge: 2019-11-06 | Disposition: A | Discharge status: Home / Self Care | Payer: Medicare Other | Attending: Surgery | Admitting: Surgery

## 2019-11-06 ENCOUNTER — Other Ambulatory Visit: Payer: Self-pay

## 2019-11-06 DIAGNOSIS — Z9842 Cataract extraction status, left eye: Secondary | ICD-10-CM | POA: Insufficient documentation

## 2019-11-06 DIAGNOSIS — K432 Incisional hernia without obstruction or gangrene: Secondary | ICD-10-CM | POA: Diagnosis not present

## 2019-11-06 DIAGNOSIS — Z880 Allergy status to penicillin: Secondary | ICD-10-CM | POA: Diagnosis not present

## 2019-11-06 DIAGNOSIS — Z923 Personal history of irradiation: Secondary | ICD-10-CM | POA: Insufficient documentation

## 2019-11-06 DIAGNOSIS — Z881 Allergy status to other antibiotic agents status: Secondary | ICD-10-CM | POA: Insufficient documentation

## 2019-11-06 DIAGNOSIS — Z9221 Personal history of antineoplastic chemotherapy: Secondary | ICD-10-CM | POA: Insufficient documentation

## 2019-11-06 DIAGNOSIS — Z8572 Personal history of non-Hodgkin lymphomas: Secondary | ICD-10-CM | POA: Diagnosis not present

## 2019-11-06 DIAGNOSIS — Z961 Presence of intraocular lens: Secondary | ICD-10-CM | POA: Insufficient documentation

## 2019-11-06 DIAGNOSIS — Z9049 Acquired absence of other specified parts of digestive tract: Secondary | ICD-10-CM | POA: Diagnosis not present

## 2019-11-06 DIAGNOSIS — Z9841 Cataract extraction status, right eye: Secondary | ICD-10-CM | POA: Insufficient documentation

## 2019-11-06 DIAGNOSIS — K439 Ventral hernia without obstruction or gangrene: Secondary | ICD-10-CM | POA: Insufficient documentation

## 2019-11-06 HISTORY — PX: XI ROBOTIC ASSISTED VENTRAL HERNIA: SHX6789

## 2019-11-06 SURGERY — REPAIR, HERNIA, VENTRAL, ROBOT-ASSISTED
Anesthesia: General | Site: Abdomen

## 2019-11-06 MED ORDER — CLINDAMYCIN PHOSPHATE 900 MG/50ML IV SOLN
INTRAVENOUS | Status: AC
  Filled 2019-11-06: qty 50

## 2019-11-06 MED ORDER — CLINDAMYCIN PHOSPHATE 900 MG/50ML IV SOLN
900.0000 mg | INTRAVENOUS | Status: AC
  Administered 2019-11-06: 900 mg via INTRAVENOUS

## 2019-11-06 MED ORDER — GABAPENTIN 300 MG PO CAPS
ORAL_CAPSULE | ORAL | Status: AC
  Administered 2019-11-06: 300 mg via ORAL
  Filled 2019-11-06: qty 1

## 2019-11-06 MED ORDER — CHLORHEXIDINE GLUCONATE CLOTH 2 % EX PADS
6.0000 | MEDICATED_PAD | Freq: Once | CUTANEOUS | Status: AC
  Administered 2019-11-06: 6 via TOPICAL

## 2019-11-06 MED ORDER — ACETAMINOPHEN 500 MG PO TABS: 1000.0000 mg | ORAL_TABLET | ORAL | Status: AC

## 2019-11-06 MED ORDER — EPINEPHRINE PF 1 MG/ML IJ SOLN
INTRAMUSCULAR | Status: AC
  Filled 2019-11-06: qty 1

## 2019-11-06 MED ORDER — LACTATED RINGERS IV SOLN: INTRAVENOUS | Status: DC

## 2019-11-06 MED ORDER — FENTANYL CITRATE (PF) 100 MCG/2ML IJ SOLN
INTRAMUSCULAR | Status: AC
  Administered 2019-11-06: 25 ug via INTRAVENOUS
  Filled 2019-11-06: qty 2

## 2019-11-06 MED ORDER — SUGAMMADEX SODIUM 500 MG/5ML IV SOLN
INTRAVENOUS | Status: DC | PRN
  Administered 2019-11-06: 400 mg via INTRAVENOUS

## 2019-11-06 MED ORDER — GABAPENTIN 300 MG PO CAPS: 300.0000 mg | ORAL_CAPSULE | ORAL | Status: AC

## 2019-11-06 MED ORDER — FAMOTIDINE 20 MG PO TABS
ORAL_TABLET | ORAL | Status: AC
  Administered 2019-11-06: 20 mg via ORAL
  Filled 2019-11-06: qty 1

## 2019-11-06 MED ORDER — BUPIVACAINE LIPOSOME 1.3 % IJ SUSP
INTRAMUSCULAR | Status: DC | PRN
  Administered 2019-11-06: 20 mL

## 2019-11-06 MED ORDER — FENTANYL CITRATE (PF) 100 MCG/2ML IJ SOLN
INTRAMUSCULAR | Status: DC | PRN
  Administered 2019-11-06 (×2): 50 ug via INTRAVENOUS

## 2019-11-06 MED ORDER — BUPIVACAINE LIPOSOME 1.3 % IJ SUSP
INTRAMUSCULAR | Status: AC
  Filled 2019-11-06: qty 20

## 2019-11-06 MED ORDER — ONDANSETRON HCL 4 MG/2ML IJ SOLN
INTRAMUSCULAR | Status: DC | PRN
  Administered 2019-11-06: 4 mg via INTRAVENOUS

## 2019-11-06 MED ORDER — BUPIVACAINE-EPINEPHRINE 0.25% -1:200000 IJ SOLN
INTRAMUSCULAR | Status: DC | PRN
  Administered 2019-11-06: 30 mL

## 2019-11-06 MED ORDER — GABAPENTIN 300 MG PO CAPS: 300.0000 mg | ORAL_CAPSULE | Freq: Once | ORAL | Status: DC

## 2019-11-06 MED ORDER — FAMOTIDINE 20 MG PO TABS: 20.0000 mg | ORAL_TABLET | Freq: Once | ORAL | Status: AC

## 2019-11-06 MED ORDER — BUPIVACAINE HCL (PF) 0.25 % IJ SOLN
INTRAMUSCULAR | Status: AC
  Filled 2019-11-06: qty 30

## 2019-11-06 MED ORDER — FENTANYL CITRATE (PF) 100 MCG/2ML IJ SOLN
INTRAMUSCULAR | Status: AC
  Filled 2019-11-06: qty 2

## 2019-11-06 MED ORDER — ACETAMINOPHEN 500 MG PO TABS
ORAL_TABLET | ORAL | Status: AC
  Administered 2019-11-06: 1000 mg via ORAL
  Filled 2019-11-06: qty 2

## 2019-11-06 MED ORDER — PROPOFOL 10 MG/ML IV BOLUS
INTRAVENOUS | Status: DC | PRN
  Administered 2019-11-06: 70 mg via INTRAVENOUS

## 2019-11-06 MED ORDER — LIDOCAINE HCL (CARDIAC) PF 100 MG/5ML IV SOSY
PREFILLED_SYRINGE | INTRAVENOUS | Status: DC | PRN
  Administered 2019-11-06: 60 mg via INTRAVENOUS

## 2019-11-06 MED ORDER — HYDROCODONE-ACETAMINOPHEN 5-325 MG PO TABS: 1.0000 | ORAL_TABLET | Freq: Four times a day (QID) | ORAL | 0 refills | Status: AC | PRN

## 2019-11-06 MED ORDER — FENTANYL CITRATE (PF) 100 MCG/2ML IJ SOLN
25.0000 ug | INTRAMUSCULAR | Status: DC | PRN
  Administered 2019-11-06 (×2): 25 ug via INTRAVENOUS

## 2019-11-06 MED ORDER — ROCURONIUM BROMIDE 100 MG/10ML IV SOLN
INTRAVENOUS | Status: DC | PRN
  Administered 2019-11-06 (×2): 40 mg via INTRAVENOUS

## 2019-11-06 SURGICAL SUPPLY — 50 items
"PENCIL ELECTRO HAND CTR " (MISCELLANEOUS) ×1 IMPLANT
BLADE SURG SZ11 CARB STEEL (BLADE) ×3 IMPLANT
CANISTER SUCT 1200ML W/VALVE (MISCELLANEOUS) ×3 IMPLANT
CHLORAPREP W/TINT 26 (MISCELLANEOUS) ×3 IMPLANT
COVER TIP SHEARS 8 DVNC (MISCELLANEOUS) ×1 IMPLANT
COVER TIP SHEARS 8MM DA VINCI (MISCELLANEOUS) ×2
COVER WAND RF STERILE (DRAPES) ×3 IMPLANT
DEFOGGER SCOPE WARMER CLEARIFY (MISCELLANEOUS) ×3 IMPLANT
DERMABOND ADVANCED (GAUZE/BANDAGES/DRESSINGS) ×2
DERMABOND ADVANCED .7 DNX12 (GAUZE/BANDAGES/DRESSINGS) ×1 IMPLANT
DRAPE 3/4 80X56 (DRAPES) ×3 IMPLANT
DRAPE ARM DVNC X/XI (DISPOSABLE) ×4 IMPLANT
DRAPE COLUMN DVNC XI (DISPOSABLE) ×1 IMPLANT
DRAPE DA VINCI XI ARM (DISPOSABLE) ×6
DRAPE DA VINCI XI COLUMN (DISPOSABLE) ×2
ELECT CAUTERY BLADE 6.4 (BLADE) ×3 IMPLANT
ELECT REM PT RETURN 9FT ADLT (ELECTROSURGICAL) ×3
ELECTRODE REM PT RTRN 9FT ADLT (ELECTROSURGICAL) ×1 IMPLANT
GLOVE BIO SURGEON STRL SZ7 (GLOVE) ×6 IMPLANT
GOWN STRL REUS W/ TWL LRG LVL3 (GOWN DISPOSABLE) ×3 IMPLANT
GOWN STRL REUS W/TWL LRG LVL3 (GOWN DISPOSABLE) ×6
GRASPER SUT TROCAR 14GX15 (MISCELLANEOUS) ×3 IMPLANT
IRRIGATION STRYKERFLOW (MISCELLANEOUS) IMPLANT
IRRIGATOR STRYKERFLOW (MISCELLANEOUS)
IV NS 1000ML (IV SOLUTION)
IV NS 1000ML BAXH (IV SOLUTION) IMPLANT
KIT PINK PAD W/HEAD ARE REST (MISCELLANEOUS) ×3
KIT PINK PAD W/HEAD ARM REST (MISCELLANEOUS) ×1 IMPLANT
LABEL OR SOLS (LABEL) ×3 IMPLANT
MESH VENT LT ST 10.2X15.2CM EL (Mesh General) ×2 IMPLANT
NEEDLE HYPO 22GX1.5 SAFETY (NEEDLE) ×3 IMPLANT
OBTURATOR OPTICAL STANDARD 8MM (TROCAR) ×2
OBTURATOR OPTICAL STND 8 DVNC (TROCAR) ×1
OBTURATOR OPTICALSTD 8 DVNC (TROCAR) ×1 IMPLANT
PACK LAP CHOLECYSTECTOMY (MISCELLANEOUS) ×3 IMPLANT
PENCIL ELECTRO HAND CTR (MISCELLANEOUS) ×3 IMPLANT
SEAL CANN UNIV 5-8 DVNC XI (MISCELLANEOUS) ×3 IMPLANT
SEAL XI 5MM-8MM UNIVERSAL (MISCELLANEOUS) ×6
SOLUTION ELECTROLUBE (MISCELLANEOUS) ×3 IMPLANT
SPONGE LAP 18X18 RF (DISPOSABLE) ×3 IMPLANT
SUT MNCRL 4-0 (SUTURE) ×2
SUT MNCRL 4-0 27XMFL (SUTURE) ×1
SUT STRATAFIX 0 PDS+ CT-2 23 (SUTURE) ×3
SUT STRATAFIX SPIRAL PDS+ 0 30 (SUTURE) ×1 IMPLANT
SUT VICRYL 0 AB UR-6 (SUTURE) ×6 IMPLANT
SUT VLOC 90 2/L VL 12 GS22 (SUTURE) ×6 IMPLANT
SUTURE MNCRL 4-0 27XMF (SUTURE) ×1 IMPLANT
SUTURE STRATFX 0 PDS+ CT-2 23 (SUTURE) IMPLANT
TROCAR 130MM GELPORT  DAV (MISCELLANEOUS) ×3 IMPLANT
TUBING EVAC SMOKE HEATED PNEUM (TUBING) ×3 IMPLANT

## 2019-11-06 NOTE — Discharge Instructions (Signed)

## 2019-11-06 NOTE — Progress Notes (Signed)
Patient resting with eyes closed. Even unlabored breathing. NAD. Woke patient to ask about pain, states she is hurting but can't give me a number. Will continue to assess

## 2019-11-06 NOTE — Anesthesia Preprocedure Evaluation (Addendum)
Anesthesia Evaluation  Patient identified by MRN, date of birth, ID band Patient awake    Reviewed: Allergy & Precautions, H&P , NPO status , Patient's Chart, lab work & pertinent test results  Airway Mallampati: II  TM Distance: >3 FB Neck ROM: full    Dental  (+) Teeth Intact   Pulmonary neg pulmonary ROS, neg COPD,    breath sounds clear to auscultation       Cardiovascular (-) hypertension(-) angina(-) Past MI negative cardio ROS  (-) dysrhythmias  Rhythm:regular Rate:Normal     Neuro/Psych negative neurological ROS  negative psych ROS   GI/Hepatic negative GI ROS, Neg liver ROS,   Endo/Other  Hypothyroidism   Renal/GU      Musculoskeletal   Abdominal   Peds  Hematology negative hematology ROS (+)   Anesthesia Other Findings Past Medical History: 2005-2006: Cancer ([place])     Comment:  lymphoma No date: Hypothyroidism No date: Status post chemoradiation     Comment:  lymphoma No date: Status post radiation therapy No date: Thyroid disease No date: Wears hearing aid in both ears  Past Surgical History: 2005-2006: ABDOMINAL EXPLORATION SURGERY     Comment:  Dr Pat Patrick 07/28/2019: CATARACT EXTRACTION W/PHACO; Left     Comment:  Procedure: CATARACT EXTRACTION PHACO AND INTRAOCULAR               LENS PLACEMENT (IOC) LEFT 7.83  00:53.4;  Surgeon:               Birder Robson, MD;  Location: [place];                Service: Ophthalmology;  Laterality: Left; 08/25/2019: CATARACT EXTRACTION W/PHACO; Right     Comment:  Procedure: CATARACT EXTRACTION PHACO AND INTRAOCULAR               LENS PLACEMENT (IOC) RIGHT 6.04 00:41.0;  Surgeon:               Birder Robson, MD;  Location: [place];                Service: Ophthalmology;  Laterality: Right; 05/25/2016: CHOLECYSTECTOMY; N/A     Comment:  Procedure: LAPAROSCOPIC CHOLECYSTECTOMY WITH               INTRAOPERATIVE CHOLANGIOGRAM;   Surgeon: Robert Bellow, MD;  Location: [facility];  Service: General;                Laterality: N/A; No date: ELBOW FRACTURE SURGERY; Right No date: EYE SURGERY 05/30/2019: LAPAROTOMY; N/A     Comment:  Procedure: EXPLORATORY LAPAROTOMYand small bowel               resection;  Surgeon: Jules Husbands, MD;  Location: [facility]               [facility];  Service: General;  Laterality: N/A;     Reproductive/Obstetrics negative OB ROS                            Anesthesia Physical Anesthesia Plan  ASA: II  Anesthesia Plan: General ETT   Post-op Pain Management:    Induction:   PONV Risk Score and Plan: Ondansetron, Dexamethasone and Treatment may vary due to age or medical condition  Airway Management Planned:   Additional Equipment:   Intra-op Plan:  Post-operative Plan:   Informed Consent: I have reviewed the patients History and Physical, chart, labs and discussed the procedure including the risks, benefits and alternatives for the proposed anesthesia with the patient or authorized representative who has indicated his/her understanding and acceptance.     Dental Advisory Given  Plan Discussed with: Anesthesiologist  Anesthesia Plan Comments:         Anesthesia Quick Evaluation

## 2019-11-06 NOTE — Transfer of Care (Signed)
Immediate Anesthesia Transfer of Care Note  Patient: Jeanette Newton  Procedure(s) Performed: XI ROBOTIC ASSISTED VENTRAL HERNIA REPAIR WITH MESH (N/A Abdomen)  Patient Location: PACU  Anesthesia Type:General  Level of Consciousness: sedated  Airway & Oxygen Therapy: Patient Spontanous Breathing and Patient connected to face mask oxygen  Post-op Assessment: Report given to RN and Post -op Vital signs reviewed and stable  Post vital signs: Reviewed and stable  Last Vitals:  Vitals Value Taken Time  BP 165/78 11/06/19 1137  Temp 36.2 C 11/06/19 1137  Pulse 76 11/06/19 1138  Resp 13 11/06/19 1138  SpO2 100 % 11/06/19 1138  Vitals shown include unvalidated device data.  Last Pain:  Vitals:   11/06/19 1137  TempSrc:   PainSc: 0-No pain         Complications: No apparent anesthesia complications

## 2019-11-06 NOTE — Interval H&P Note (Signed)
History and Physical Interval Note:  11/06/2019 7:53 AM  Jeanette Newton  has presented today for surgery, with the diagnosis of Ventral Hernia.  The various methods of treatment have been discussed with the patient and family. After consideration of risks, benefits and other options for treatment, the patient has consented to  Procedure(s): XI ROBOTIC [place] (N/A) as a surgical intervention.  The patient's history has been reviewed, patient examined, no change in status, stable for surgery.  I have reviewed the patient's chart and labs.  Questions were answered to the patient's satisfaction.     [place]

## 2019-11-06 NOTE — Anesthesia Procedure Notes (Signed)
Procedure Name: Intubation Performed by: Lesle Reek, CRNA Pre-anesthesia Checklist: Emergency Drugs available, Patient identified, Suction available, Patient being monitored and Timeout performed Patient Re-evaluated:Patient Re-evaluated prior to induction Oxygen Delivery Method: Circle system utilized Preoxygenation: Pre-oxygenation with 100% oxygen Induction Type: IV induction Laryngoscope Size: Mac and 3 Grade View: Grade I Tube size: 7.0 mm Number of attempts: 1 Placement Confirmation: ETT inserted through vocal cords under direct vision,  breath sounds checked- equal and bilateral,  CO2 detector and positive ETCO2 Secured at: 20 cm Tube secured with: Tape

## 2019-11-06 NOTE — Op Note (Signed)
Robotic assisted laparoscopic ventral hernia Repair IPOM using  Oval 15.2x10.2 cm ventralight BARD mesh   Pre-operative Diagnosis: ventral hernia   Post-operative Diagnosis: same   Surgeon: Caroleen Hamman, MD FACS   Anesthesia: Gen. with endotracheal tube     Findings: 6.5 cm ventral hernia    Estimated Blood Loss: 5cc         Complications: none             Procedure Details  The patient was seen again in the Holding Room. The benefits, complications, treatment options, and expected outcomes were discussed with the patient. The risks of bleeding, infection, recurrence of symptoms, failure to resolve symptoms, bowel injury, mesh placement, mesh infection, any of which could require further surgery were reviewed with the patient. The likelihood of improving the patient's symptoms with return to their baseline status is good.  The patient and/or family concurred with the proposed plan, giving informed consent.  The patient was taken to Operating Room, identified and the procedure verified.  A Time Out was held and the above information confirmed.   Prior to the induction of general anesthesia, antibiotic prophylaxis was administered. VTE prophylaxis was in place. General endotracheal anesthesia was then administered and tolerated well. After the induction, the abdomen was prepped with Chloraprep and draped in the sterile fashion. The patient was positioned in the supine position.   We used a subxiphoid incision and using a cutdown technique we were able to identify the fascia both anteriorly and posteriorly elevated incised and 2 stay sutures were placed.  Hassan trocar inserted and pneumoperitoneum obtained.  No hemodynamic compromise.   2 additional 8 mm ports were placed under direct visualization.  I visualized the hernia and there was an epigastric ventral hernia measuring approximately 6.5 cm.  At this time I went ahead and inserted the 15.2x10.2 cm mesh with an echo location.   The  robot was brought to the surgical field and docked in the standard fashion.  We made sure that all instrumentation was kept under direct vision at all times and there was no collision between the arms.  I scrubbed out and went to the console.   Confirm and measured that the defect was 6.5 cm.  .  Using a 0 stratafix suture we closed the ventral defect primarily.  The PMI was used to pierce the defect in the center.  Using the mesh and the echo location device we were able to bring the mesh towards the abdominal wall. Falciform was taken down with electrocautery to allow adequate mesh placement.   The mesh was secured circumferentially to the abdominal wall using 2 OV lock in the standard fashion.   The mesh layed really nicely against the  abdominal wall. A second look laparoscopy revealed no evidence of intra-abdominal injury.    All the needles and foreign objects were removed under direct visualization.  The instruments were removed and the robot was undocked.  I scrubbed back in, The laparoscopic ports were removed under direct visualization and the pneumoperitoneum was deflated.   Incisions were closed with  4-0 Monocryl  And the fascial sutures approximated in the standard fashion Dermabond was used to coat the skin. Liposomal Marcaine  was used to inject all the incision sites. Patient tolerated procedure well and there were no immediate complications. Needle and laparotomy counts were correct    Caroleen Hamman, MD, FACS

## 2019-11-06 NOTE — OR Nursing (Signed)
Per Dr. Dahlia Byes pt is okay to receive IV antibiotic cleo

## 2019-11-07 NOTE — Anesthesia Postprocedure Evaluation (Signed)
Anesthesia Post Note  Patient: Jeanette Newton  Procedure(s) Performed: XI ROBOTIC ASSISTED VENTRAL HERNIA REPAIR WITH MESH (N/A Abdomen)  Patient location during evaluation: PACU Anesthesia Type: General Level of consciousness: awake and alert Pain management: pain level controlled Vital Signs Assessment: post-procedure vital signs reviewed and stable Respiratory status: spontaneous breathing, nonlabored ventilation and respiratory function stable Cardiovascular status: blood pressure returned to baseline and stable Postop Assessment: no apparent nausea or vomiting Anesthetic complications: no     Last Vitals:  Vitals:   11/06/19 1407 11/06/19 1437  BP: (!) 147/74 (!) 168/64  Pulse: (!) 58 (!) 56  Resp: 10 16  Temp:  36.6 C  SpO2: 99% 100%    Last Pain:  Vitals:   11/06/19 1437  TempSrc: Temporal  PainSc: [place]

## 2019-11-23 ENCOUNTER — Other Ambulatory Visit: Payer: Self-pay

## 2019-11-23 ENCOUNTER — Telehealth (INDEPENDENT_AMBULATORY_CARE_PROVIDER_SITE_OTHER): Payer: Medicare Other | Admitting: Surgery

## 2019-11-23 DIAGNOSIS — Z09 Encounter for follow-up examination after completed treatment for conditions other than malignant neoplasm: Secondary | ICD-10-CM

## 2019-11-23 NOTE — Progress Notes (Signed)
I placed a phone call to Jeanette Newton on her cell phone.  I talked to her and her daughter.  She is doing very well after robotic hernia repair.  She experiences no pain.  She has been tolerating diet and she has been ambulating.  No concerns at this time.  A/p doing very well without any obvious postoperative complications.  No heavy lifting for a total of 6 weeks and return to clinic as needed.

## 2020-06-02 ENCOUNTER — Other Ambulatory Visit: Payer: Self-pay | Admitting: Internal Medicine

## 2020-06-02 DIAGNOSIS — Z1231 Encounter for screening mammogram for malignant neoplasm of breast: Secondary | ICD-10-CM

## 2020-07-01 ENCOUNTER — Ambulatory Visit
Admission: RE | Admit: 2020-07-01 | Discharge: 2020-07-01 | Disposition: A | Discharge status: Home / Self Care | Payer: Medicare Other | Source: Ambulatory Visit | Attending: Internal Medicine | Admitting: Internal Medicine

## 2020-07-01 ENCOUNTER — Other Ambulatory Visit: Payer: Self-pay

## 2020-07-01 DIAGNOSIS — Z1231 Encounter for screening mammogram for malignant neoplasm of breast: Secondary | ICD-10-CM | POA: Diagnosis present

## 2020-07-01 IMAGING — MG DIGITAL SCREENING BILAT W/ TOMO W/ CAD
6 of 10 series · 6 of 30 positions shown · non-contrast
Comparison: Previous exam(s).

CLINICAL DATA: Screening.

EXAM:
DIGITAL SCREENING BILATERAL MAMMOGRAM WITH TOMO AND CAD

[L CC synth-2D]
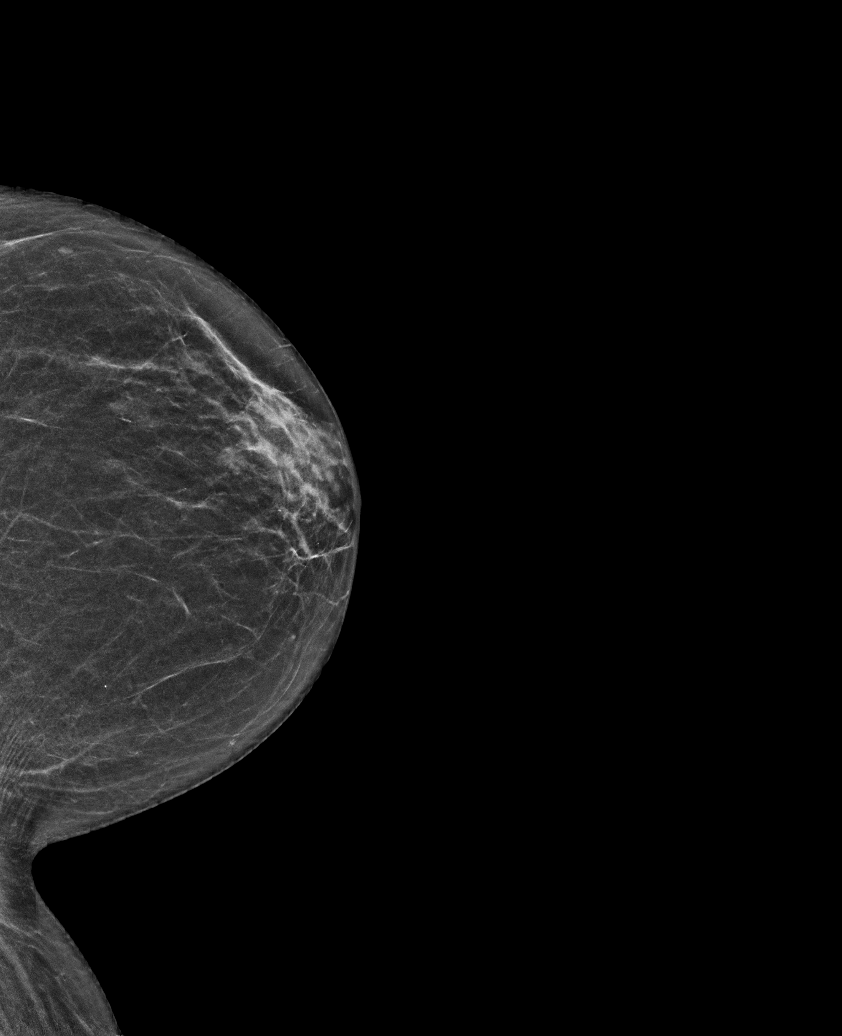

[R CC synth-2D (1 of 2)]
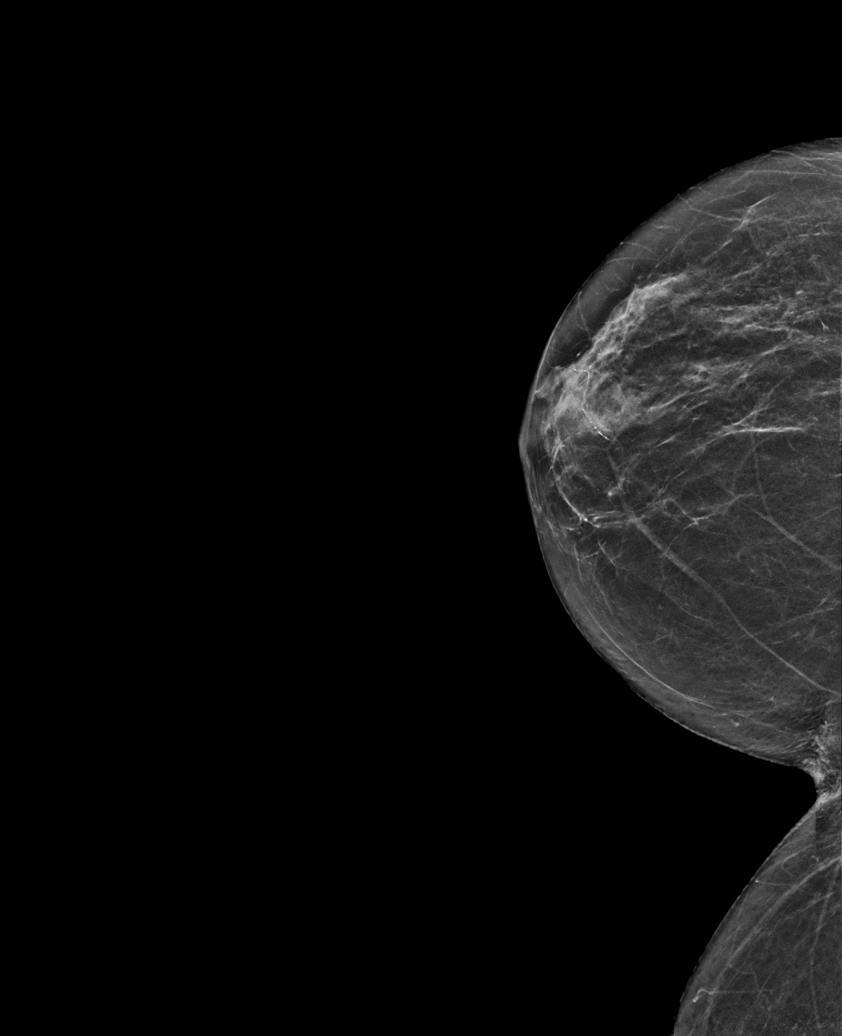

[L MLO synth-2D]
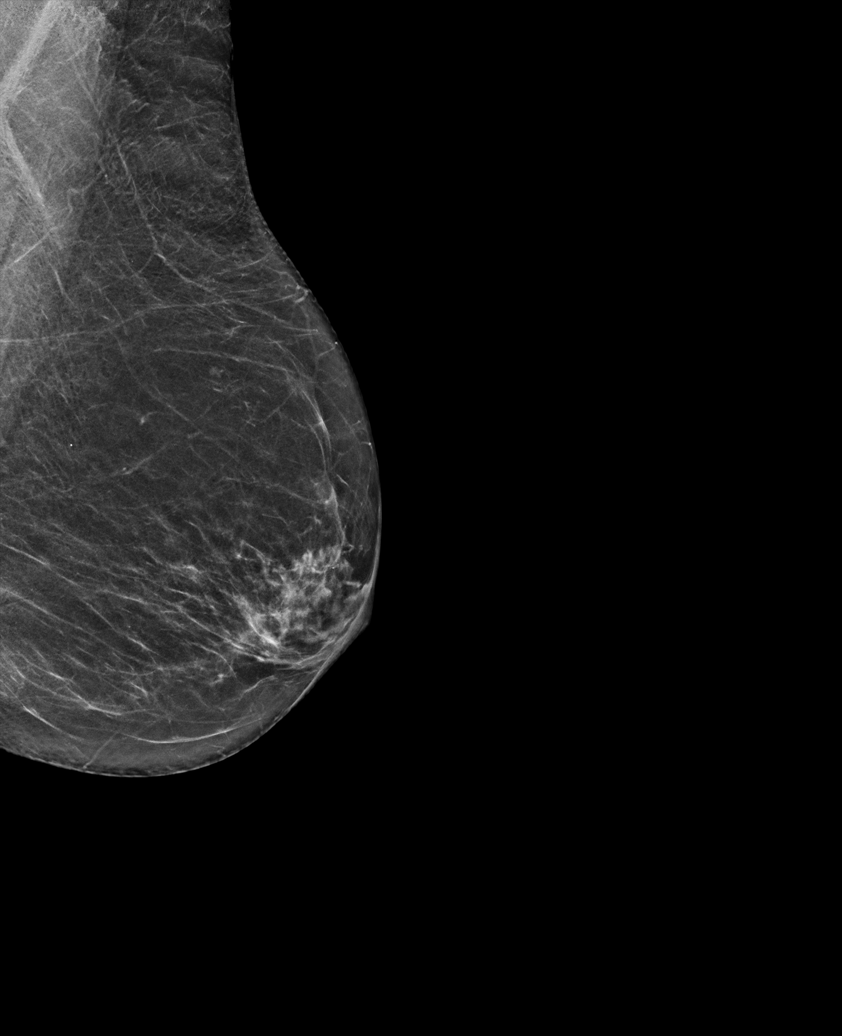

[R MLO synth-2D]
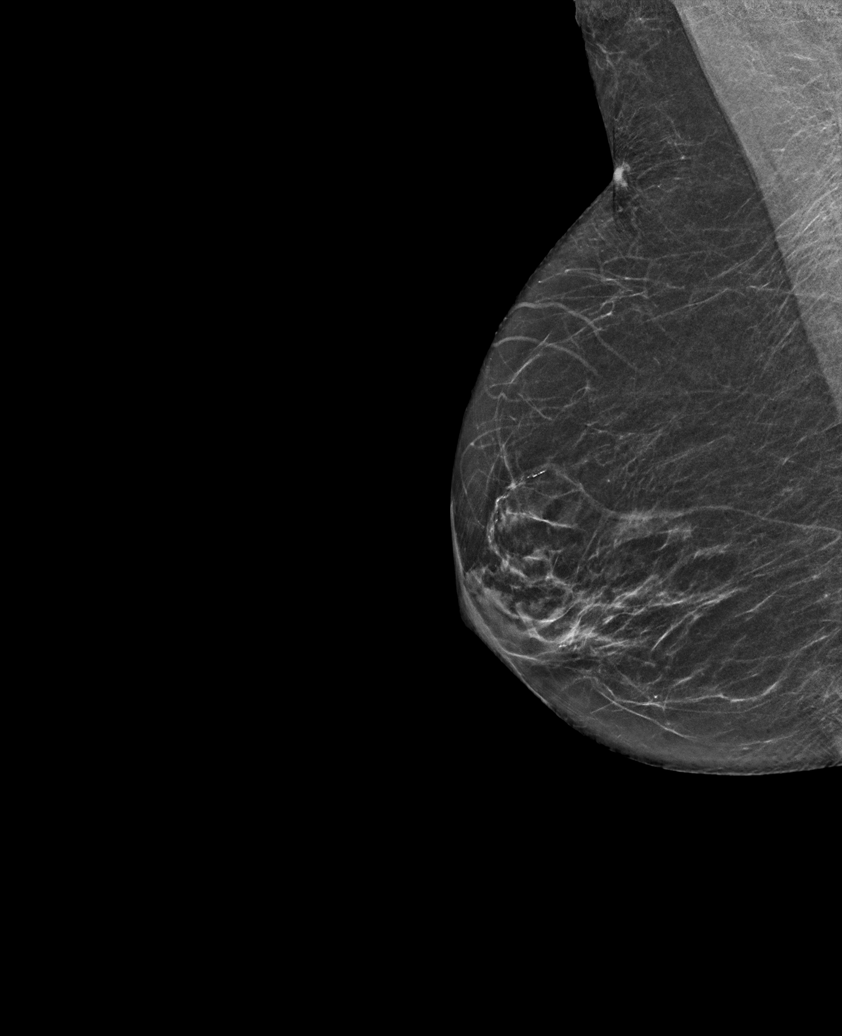

[R CC synth-2D (2 of 2)]
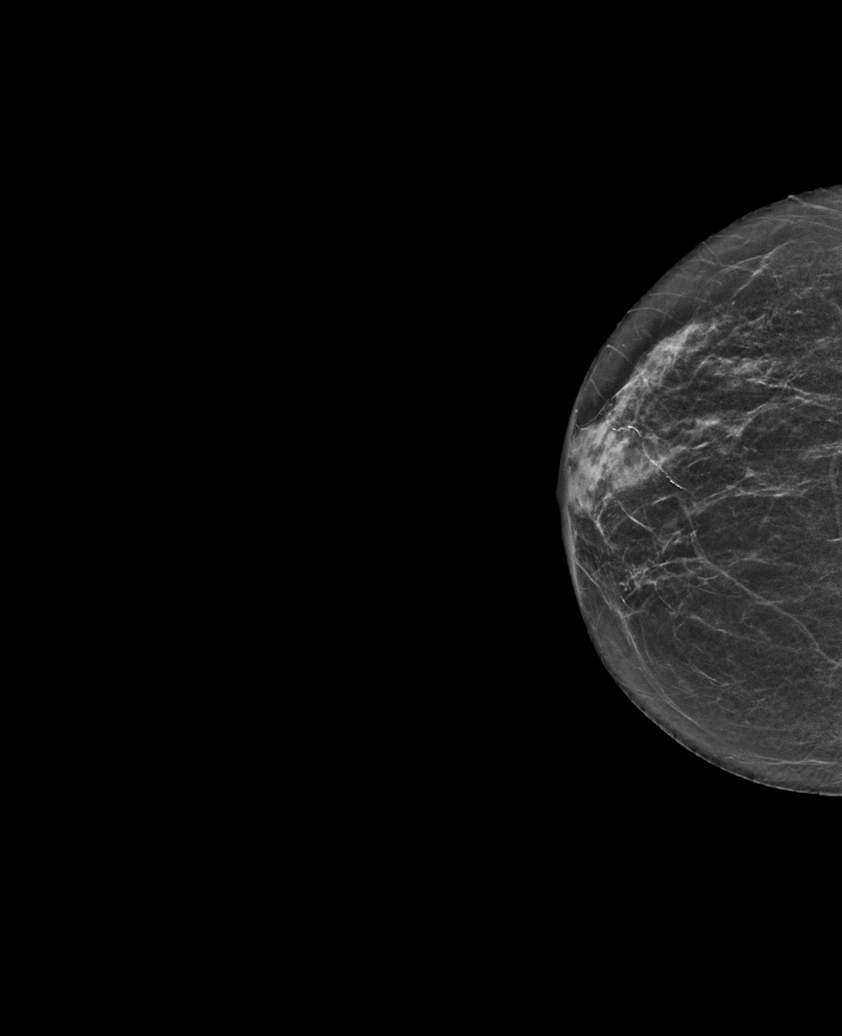

[R MLO tomo · tomo slice 29/56.0]
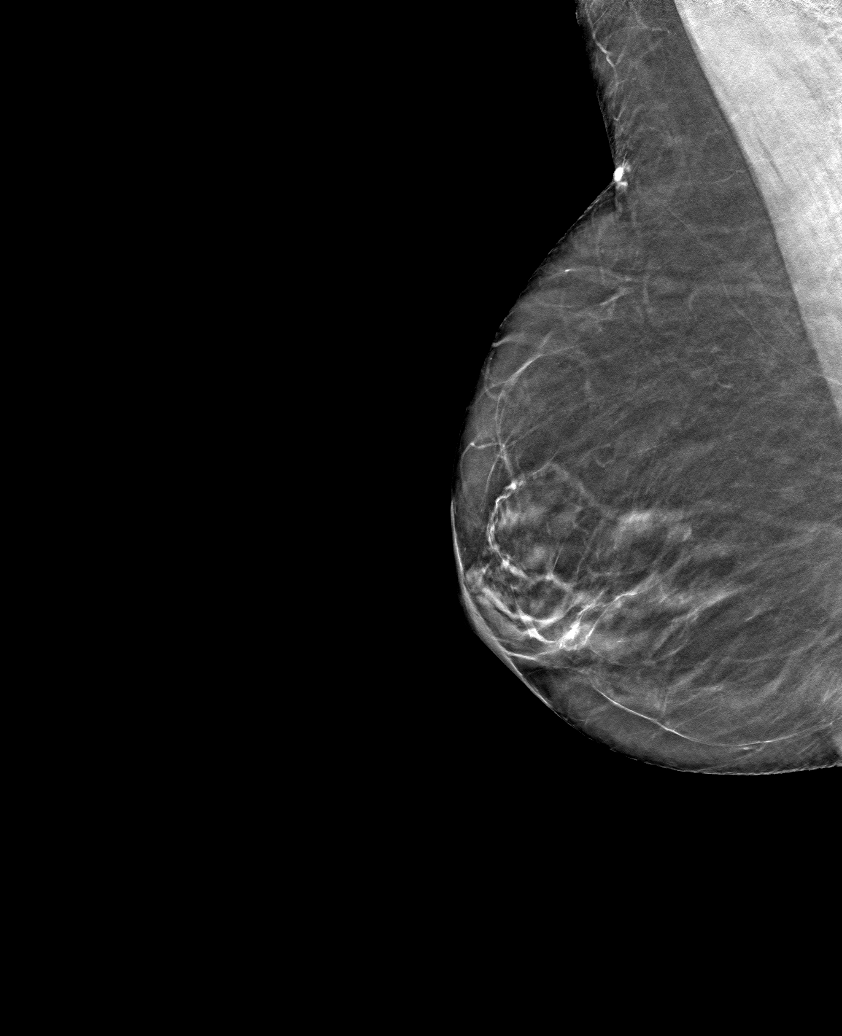

[6 of 30 positions shown; findings below may reference images not displayed]

ACR Breast Density Category b: There are scattered areas of
fibroglandular density.
FINDINGS: There are no findings suspicious for malignancy. Images were
processed with CAD.
IMPRESSION: No mammographic evidence of malignancy. A result letter of this
screening mammogram will be mailed directly to the patient.

RECOMMENDATION:
Screening mammogram in one year as long as the patient has a life
expectancy of 10+ years. (Code:[7Q])

BI-RADS CATEGORY  1: Negative.

## 2020-10-04 ENCOUNTER — Other Ambulatory Visit: Payer: Self-pay | Admitting: Internal Medicine

## 2020-10-04 ENCOUNTER — Other Ambulatory Visit: Payer: Self-pay

## 2020-10-04 ENCOUNTER — Ambulatory Visit
Admission: RE | Admit: 2020-10-04 | Discharge: 2020-10-04 | Disposition: A | Discharge status: Home / Self Care | Payer: Medicare Other | Source: Ambulatory Visit | Attending: Internal Medicine | Admitting: Internal Medicine

## 2020-10-04 DIAGNOSIS — G119 Hereditary ataxia, unspecified: Secondary | ICD-10-CM | POA: Diagnosis present

## 2020-10-04 IMAGING — CT CT HEAD W/O CM
3 series · 15 of 45 positions shown, 18 images · non-contrast
Comparison: Head CT dated [DATE].

CLINICAL DATA: [AGE] female with altered mental status.

EXAM:
CT HEAD WITHOUT CONTRAST
TECHNIQUE: Contiguous axial images were obtained from the base of the skull
through the vertex without intravenous contrast.

[Series 2: head wo · axial · 0.41mm/px · z∈[+367,+482]mm · 9 of 28 slices shown, 12 images]
[im 3/28  brain]
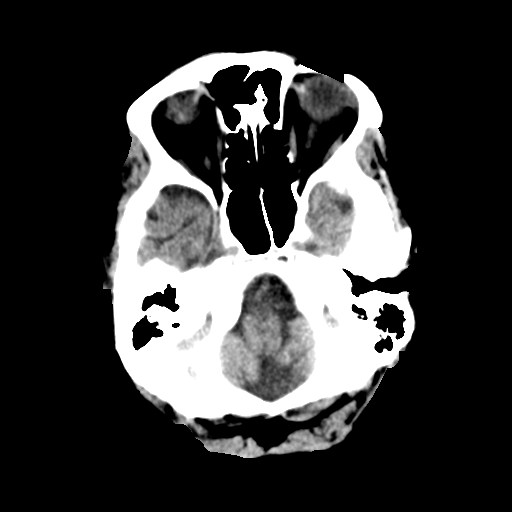
[im 3/28  bone]
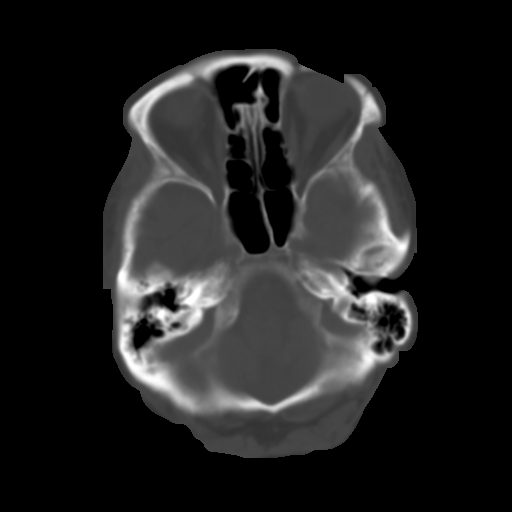
[im 6/28  brain]
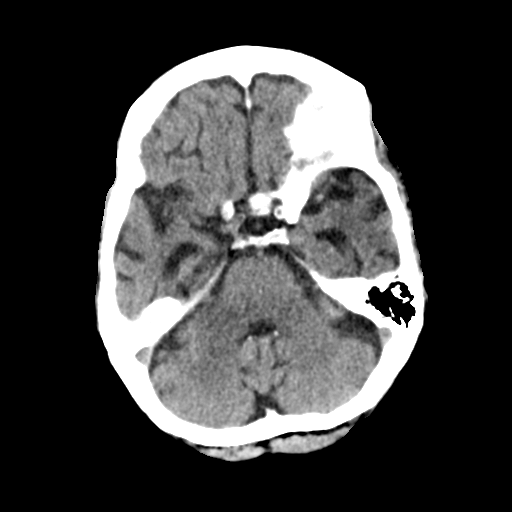
[im 9/28  brain]
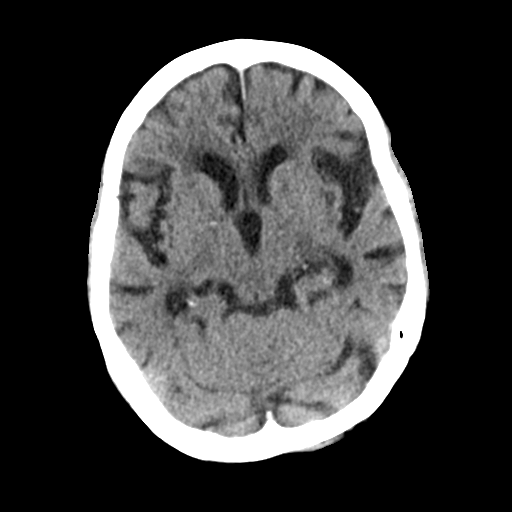
[im 12/28  brain]
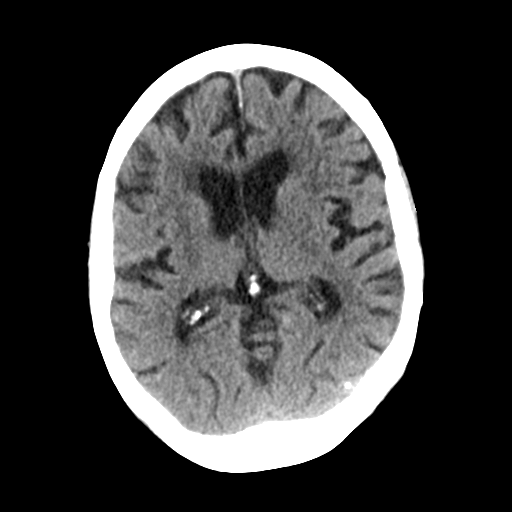
[im 15/28  brain]
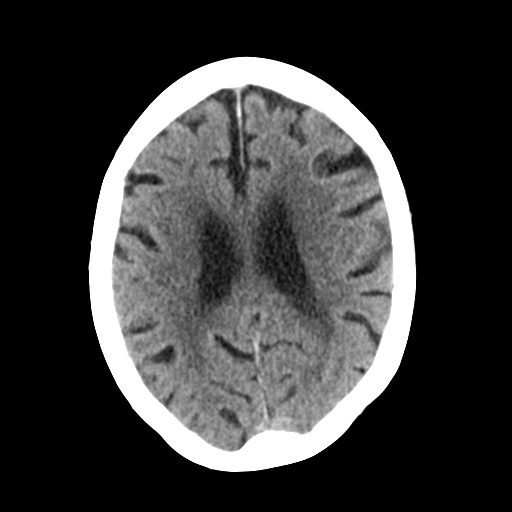
[im 15/28  bone]
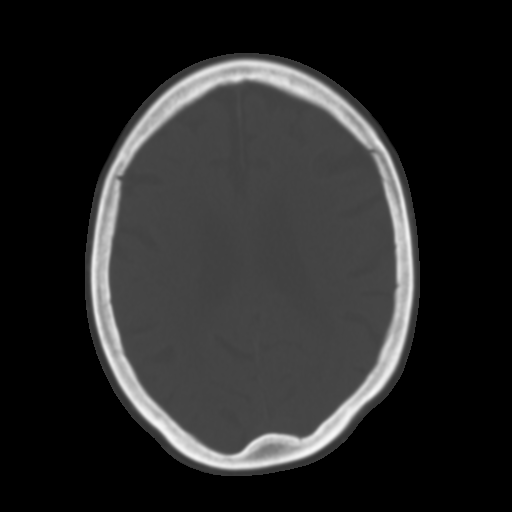
[im 17/28  brain]
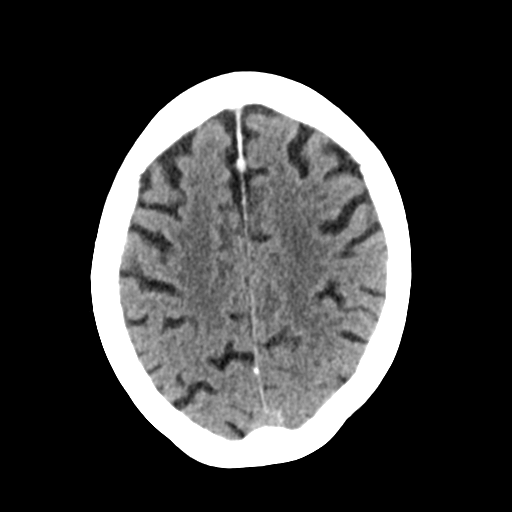
[im 20/28  brain]
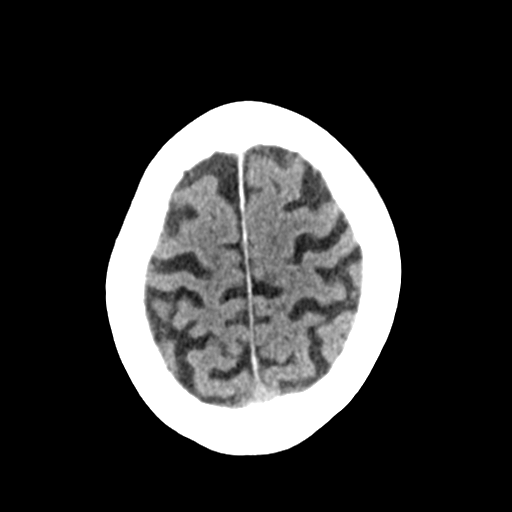
[im 23/28  brain]
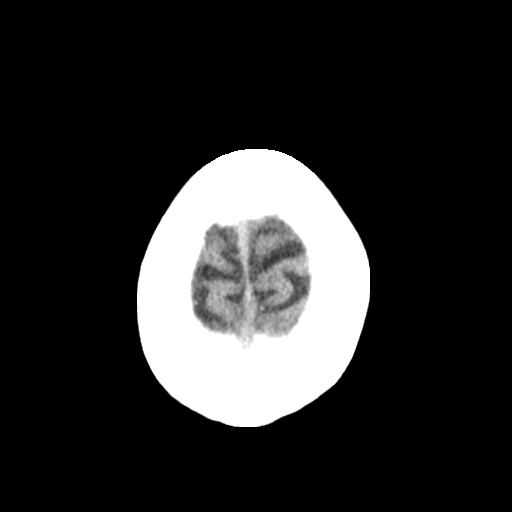
[im 26/28  brain]
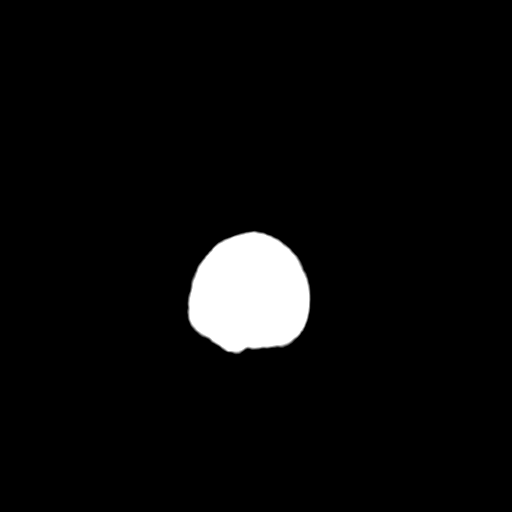
[im 26/28  bone]
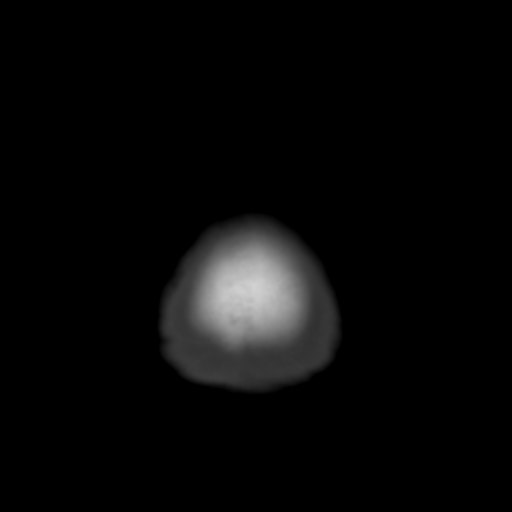

[Series 4: coronal soft tissue · coronal · 0.28mm/px · 3 of 60 slices shown]
[im 20/60  brain]
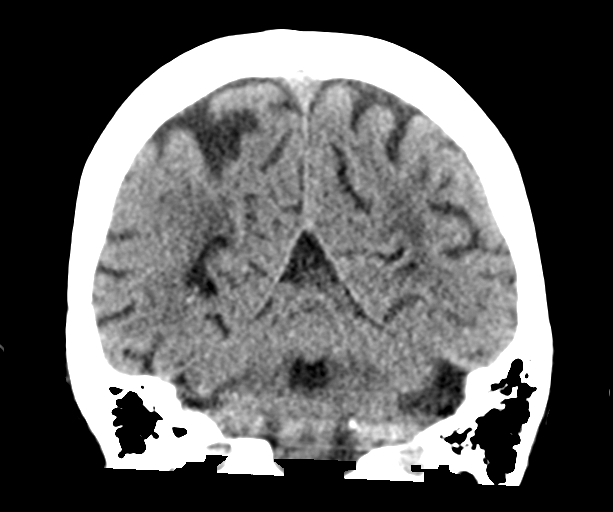
[im 27/60  brain]
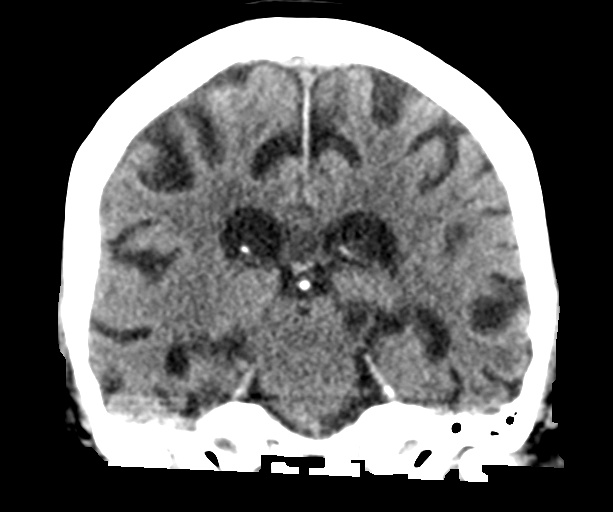
[im 33/60  brain]
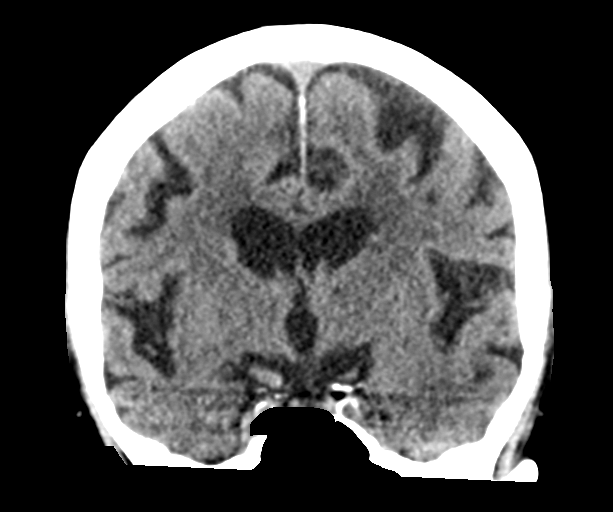

[Series 5: sagittal soft tissue · sagittal · 0.28mm/px · 3 of 50 slices shown]
[im 17/50  brain]
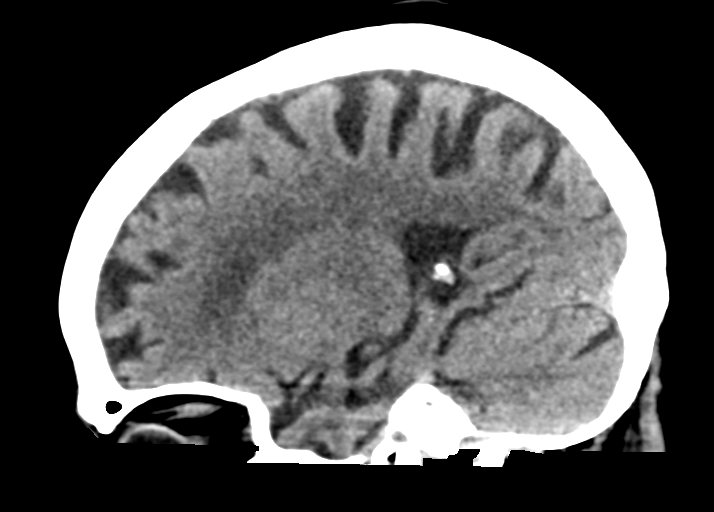
[im 25/50  brain]
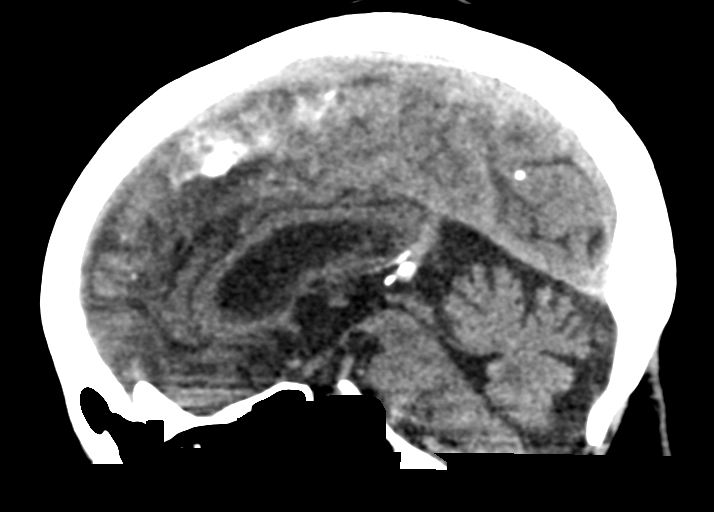
[im 33/50  brain]
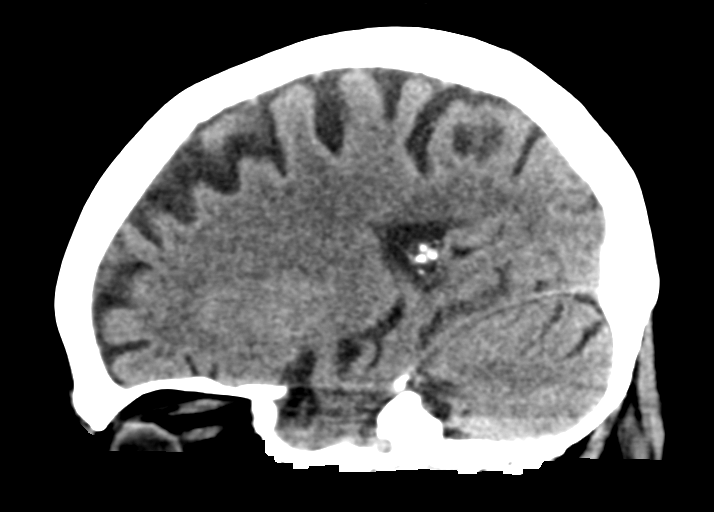

[15 of 45 positions shown; findings below may reference images not displayed]

FINDINGS: Brain: Mild age-related atrophy and chronic microvascular ischemic
changes. Probable small calcified meningioma along the anterior
falx. There is no acute intracranial hemorrhage. No mass effect
midline shift. No extra-axial fluid collection.

Vascular: No hyperdense vessel or unexpected calcification.

Skull: Normal. Negative for fracture or focal lesion.

Sinuses/Orbits: The visualized paranasal sinuses and mastoid air
cells are clear.

Other: None
IMPRESSION: 1. No acute intracranial pathology.
2. Mild age-related atrophy and chronic microvascular ischemic
changes.

## 2020-10-18 ENCOUNTER — Other Ambulatory Visit (HOSPITAL_COMMUNITY): Payer: Self-pay | Admitting: Neurology

## 2020-10-18 ENCOUNTER — Other Ambulatory Visit: Payer: Self-pay | Admitting: Neurology

## 2020-10-18 DIAGNOSIS — F0150 Vascular dementia without behavioral disturbance: Secondary | ICD-10-CM

## 2020-10-18 DIAGNOSIS — G309 Alzheimer's disease, unspecified: Secondary | ICD-10-CM

## 2020-11-01 ENCOUNTER — Encounter (HOSPITAL_COMMUNITY): Payer: Self-pay

## 2020-11-01 ENCOUNTER — Ambulatory Visit (HOSPITAL_COMMUNITY): Payer: Medicare Other

## 2020-11-02 ENCOUNTER — Other Ambulatory Visit: Payer: Self-pay

## 2020-11-02 ENCOUNTER — Ambulatory Visit (HOSPITAL_COMMUNITY)
Admission: RE | Admit: 2020-11-02 | Discharge: 2020-11-02 | Disposition: A | Discharge status: Home / Self Care | Payer: Medicare Other | Source: Ambulatory Visit | Attending: Neurology | Admitting: Neurology

## 2020-11-02 DIAGNOSIS — F0150 Vascular dementia without behavioral disturbance: Secondary | ICD-10-CM | POA: Insufficient documentation

## 2020-11-02 DIAGNOSIS — F0280 Dementia in other diseases classified elsewhere without behavioral disturbance: Secondary | ICD-10-CM | POA: Insufficient documentation

## 2020-11-02 DIAGNOSIS — G309 Alzheimer's disease, unspecified: Secondary | ICD-10-CM | POA: Diagnosis not present

## 2020-11-02 IMAGING — MR MR HEAD W/O CM
10 of 12 series · 29 of 48 positions shown · non-contrast
Comparison: None.

CLINICAL DATA: Mixed dementia

EXAM:
MRI HEAD WITHOUT CONTRAST
TECHNIQUE: Multiplanar, multiecho pulse sequences of the brain and surrounding
structures were obtained without intravenous contrast.

[Series 3: FLAIR · sagittal · 5.0mm · 0.47mm/px · 1 of 30 slices shown (1 of 2)]
[im 1/30]
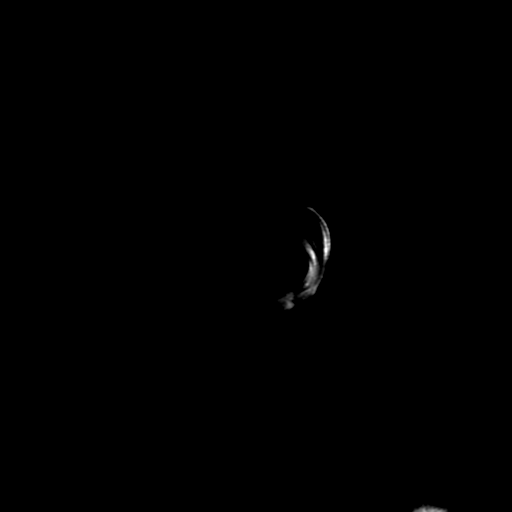

[Series 4: DWI · axial · 3.0mm · 0.94mm/px · z∈[-127,+20]mm · 5 of 102 slices shown (1 of 2)]
[im 1/102]
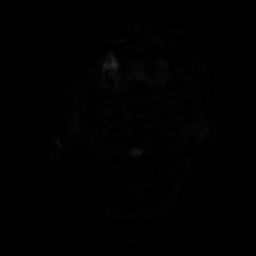
[im 26/102]
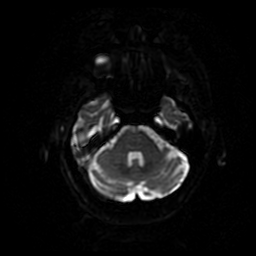
[im 51/102]
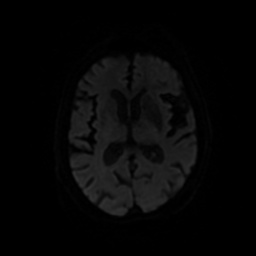
[im 76/102]
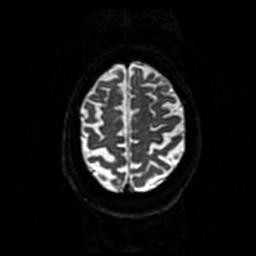
[im 102/102]
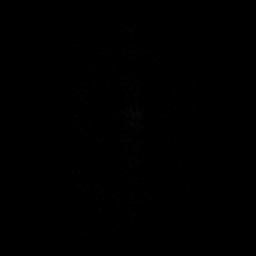

[Series 5: T2 · axial · 5.0mm · 0.47mm/px · z∈[-143,+9]mm · 2 of 27 slices shown (1 of 2)]
[im 1/27]
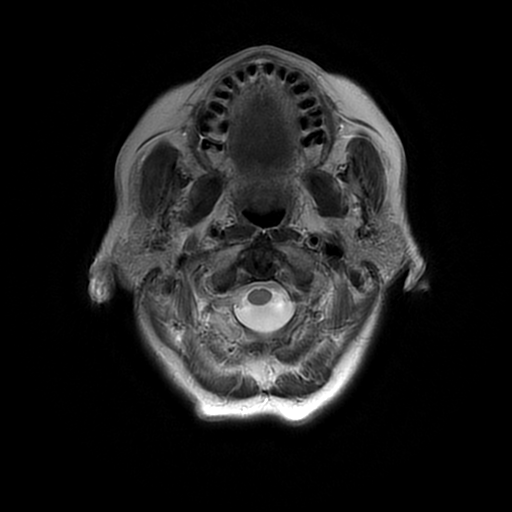
[im 27/27]
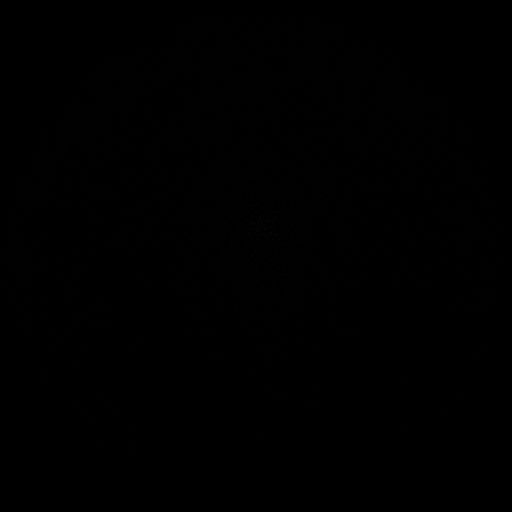

[Series 6: FLAIR · axial · 4.0mm · 0.41mm/px · z∈[-133,+17]mm · 2 of 29 slices shown (2 of 2)]
[im 1/29]
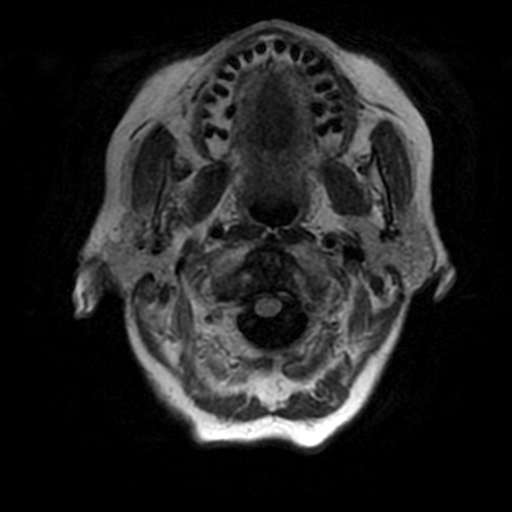
[im 29/29]
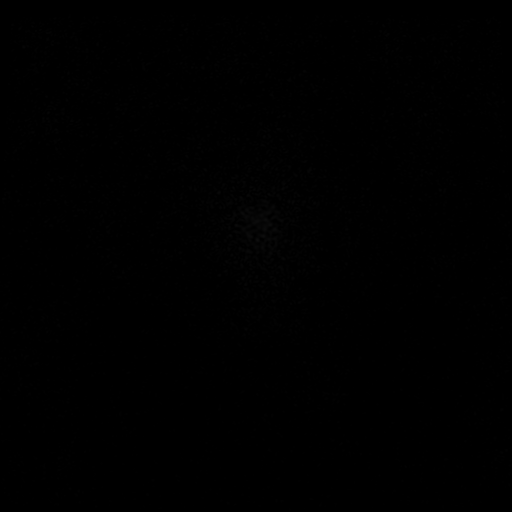

[Series 7: DWI · coronal · 4.0mm · 0.94mm/px · 4 of 80 slices shown (2 of 2)]
[im 1/80]
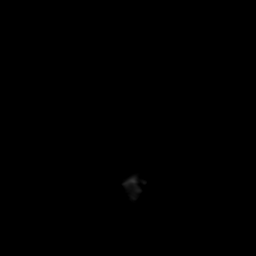
[im 27/80]
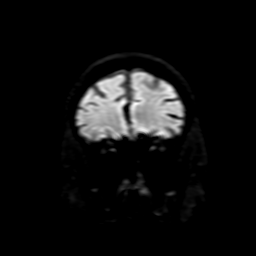
[im 53/80]
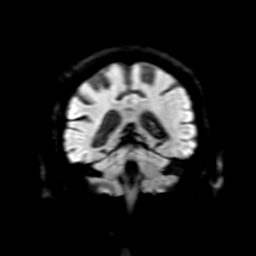
[im 80/80]
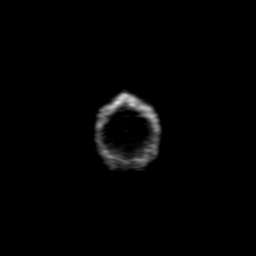

[Series 8: SWI · axial · 2.9mm · 0.47mm/px · z∈[-137,+10]mm · 6 of 102 slices shown (1 of 2)]
[im 1/102]
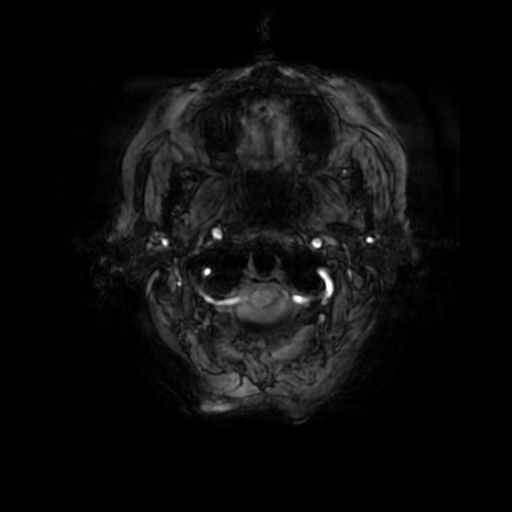
[im 21/102]
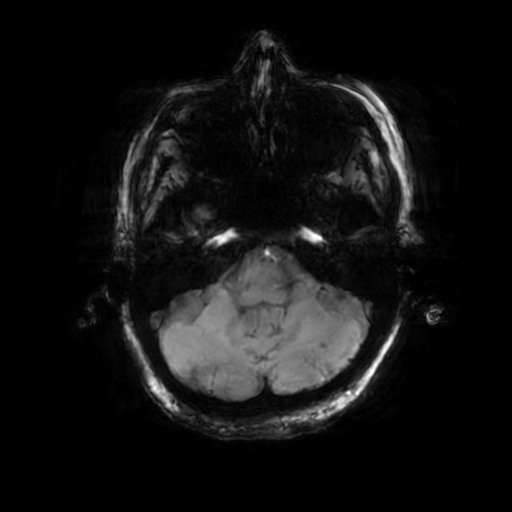
[im 41/102]
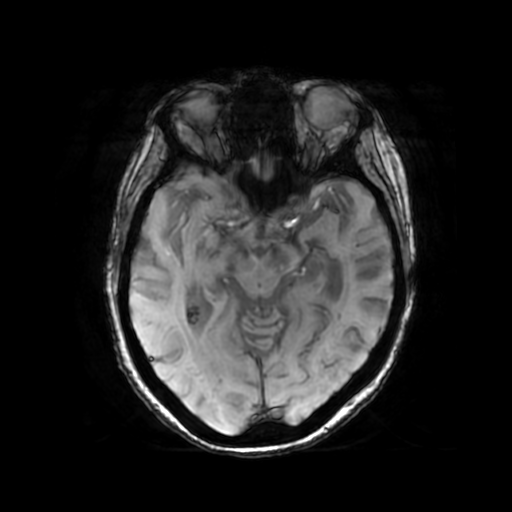
[im 61/102]
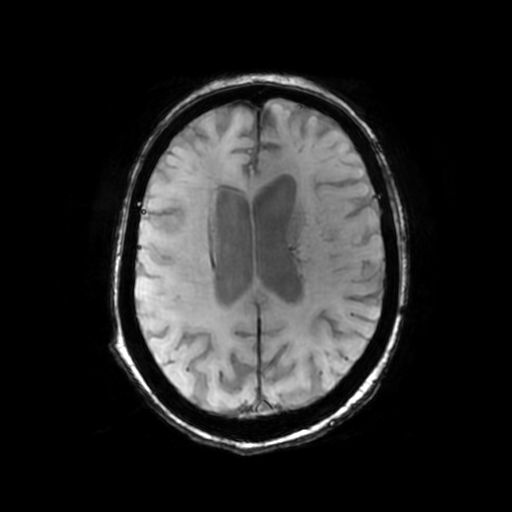
[im 81/102]
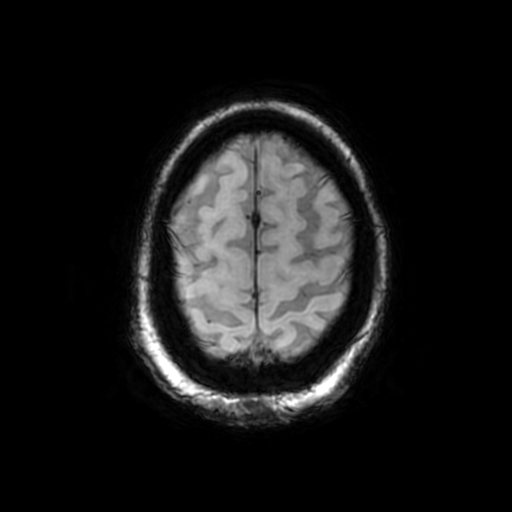
[im 102/102]
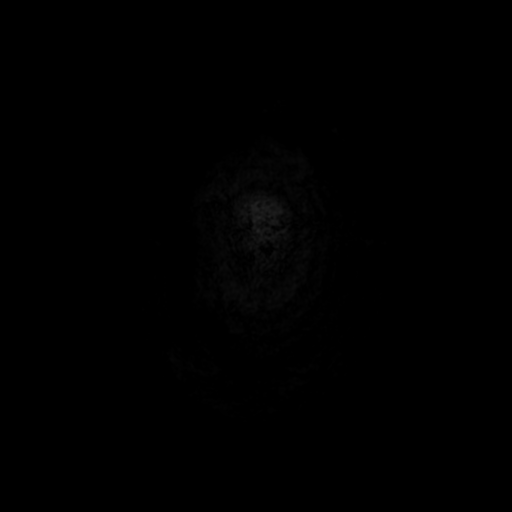

[Series 11: T2 · coronal · 5.0mm · 0.39mm/px · 2 of 30 slices shown (2 of 2)]
[im 1/30]
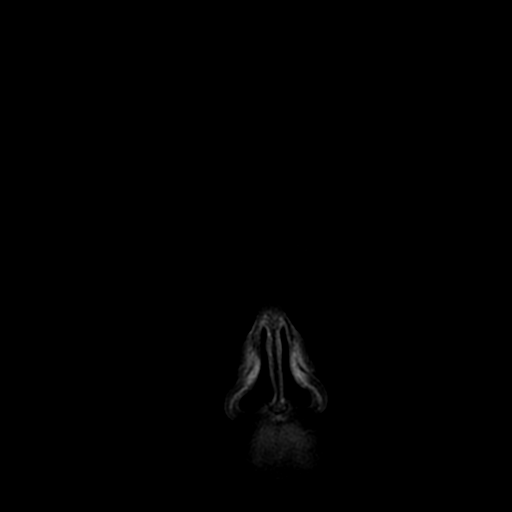
[im 30/30]
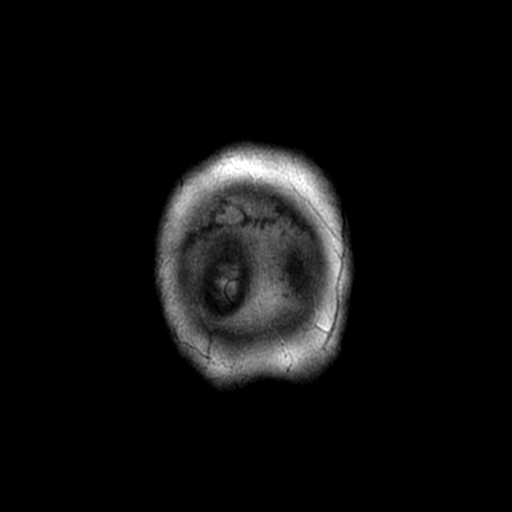

[Series 450: ADC · axial · 3.0mm · 0.94mm/px · z∈[-127,+20]mm · 3 of 51 slices shown (1 of 2)]
[im 1/51]
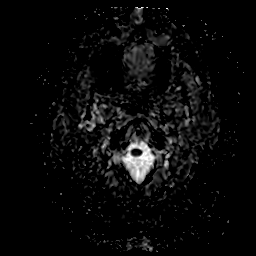
[im 26/51]
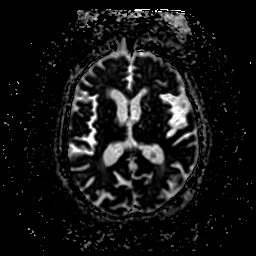
[im 51/51]
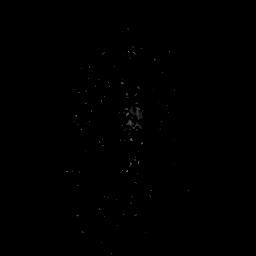

[Series 750: ADC · coronal · 4.0mm · 0.94mm/px · 2 of 40 slices shown (2 of 2)]
[im 1/40]
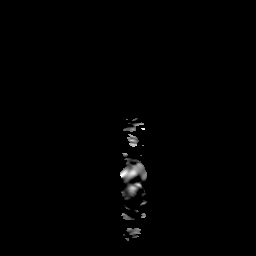
[im 40/40]
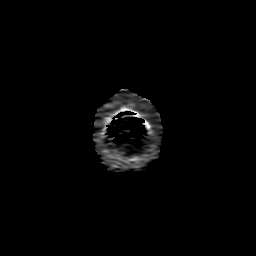

[Series 800: SWI · axial · 2.9mm · 0.47mm/px · z∈[-137,-108]mm · 2 of 101 slices shown (2 of 2)]
[im 1/101]
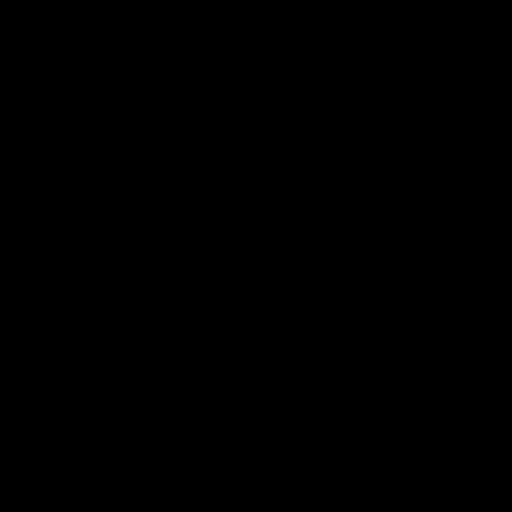
[im 21/101]
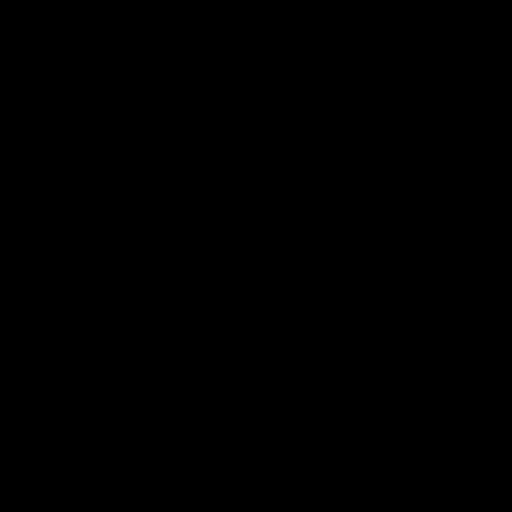

[29 of 48 positions shown; findings below may reference images not displayed]

FINDINGS: Brain: No acute infarct, mass effect or extra-axial collection. No
acute or chronic hemorrhage. There is multifocal hyperintense
T2-weighted signal within the white matter. Generalized volume loss
without a clear lobar predilection. The midline structures are
normal. Old left cerebellar infarct.

Vascular: Major flow voids are preserved.

Skull and upper cervical spine: Normal calvarium and skull base.
Visualized upper cervical spine and soft tissues are normal.

Sinuses/Orbits:No paranasal sinus fluid levels or advanced mucosal
thickening. No mastoid or middle ear effusion. Normal orbits.
IMPRESSION: 1. No acute intracranial abnormality.
2. Findings of chronic small vessel ischemia and old left cerebellar
infarct.
3. Generalized volume loss in a nonspecific pattern. Quantitative,
volumetric MRI of the brain may be helpful for more specific
evaluation for characteristic atrophy patterns associated with
dementia.

## 2021-08-16 ENCOUNTER — Other Ambulatory Visit: Payer: Self-pay | Admitting: Internal Medicine

## 2021-08-16 DIAGNOSIS — Z1231 Encounter for screening mammogram for malignant neoplasm of breast: Secondary | ICD-10-CM

## 2021-08-30 ENCOUNTER — Ambulatory Visit
Admission: RE | Admit: 2021-08-30 | Discharge: 2021-08-30 | Disposition: A | Discharge status: Home / Self Care | Payer: Medicare Other | Source: Ambulatory Visit | Attending: Internal Medicine | Admitting: Internal Medicine

## 2021-08-30 ENCOUNTER — Other Ambulatory Visit: Payer: Self-pay

## 2021-08-30 DIAGNOSIS — Z1231 Encounter for screening mammogram for malignant neoplasm of breast: Secondary | ICD-10-CM | POA: Diagnosis present

## 2021-08-30 IMAGING — MG MM DIGITAL SCREENING BILAT W/ TOMO AND CAD
6 of 10 series · 6 of 30 positions shown · non-contrast
Comparison: Previous exam(s).

CLINICAL DATA: Screening.

EXAM:
DIGITAL SCREENING BILATERAL MAMMOGRAM WITH TOMOSYNTHESIS AND CAD
TECHNIQUE: Bilateral screening digital craniocaudal and mediolateral oblique
mammograms were obtained. Bilateral screening digital breast
tomosynthesis was performed. The images were evaluated with
computer-aided detection.

[L MLO synth-2D]
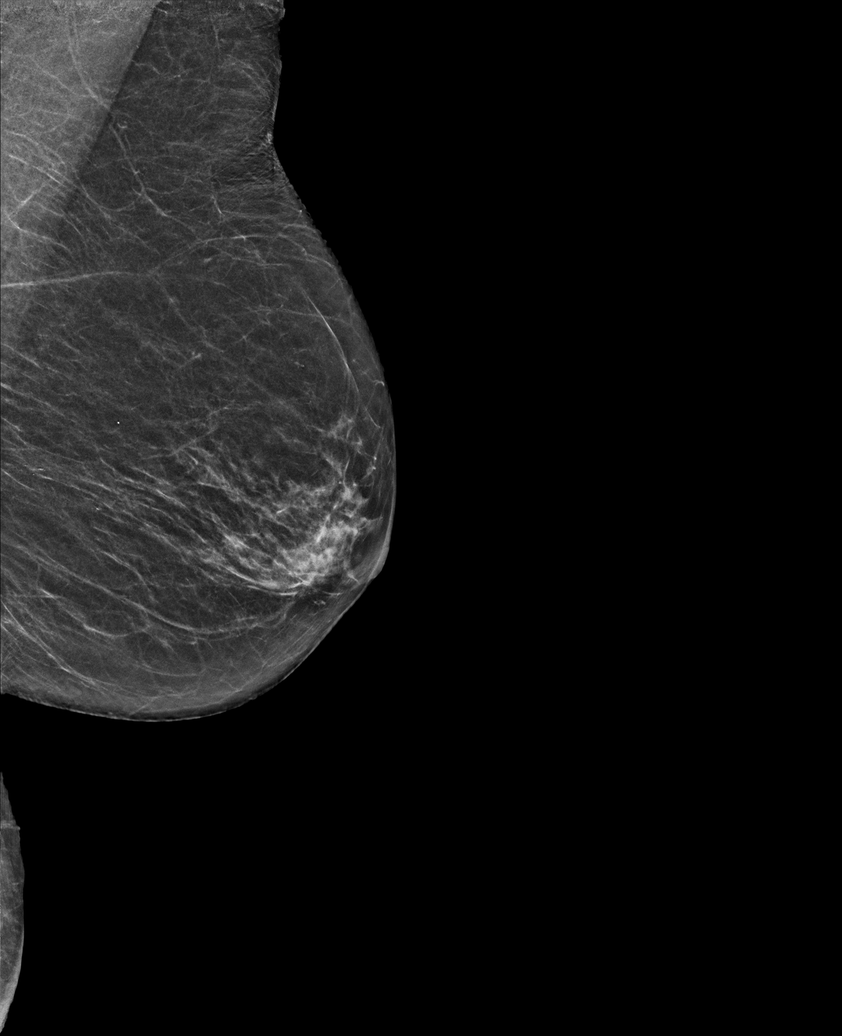

[R CC synth-2D (1 of 2)]
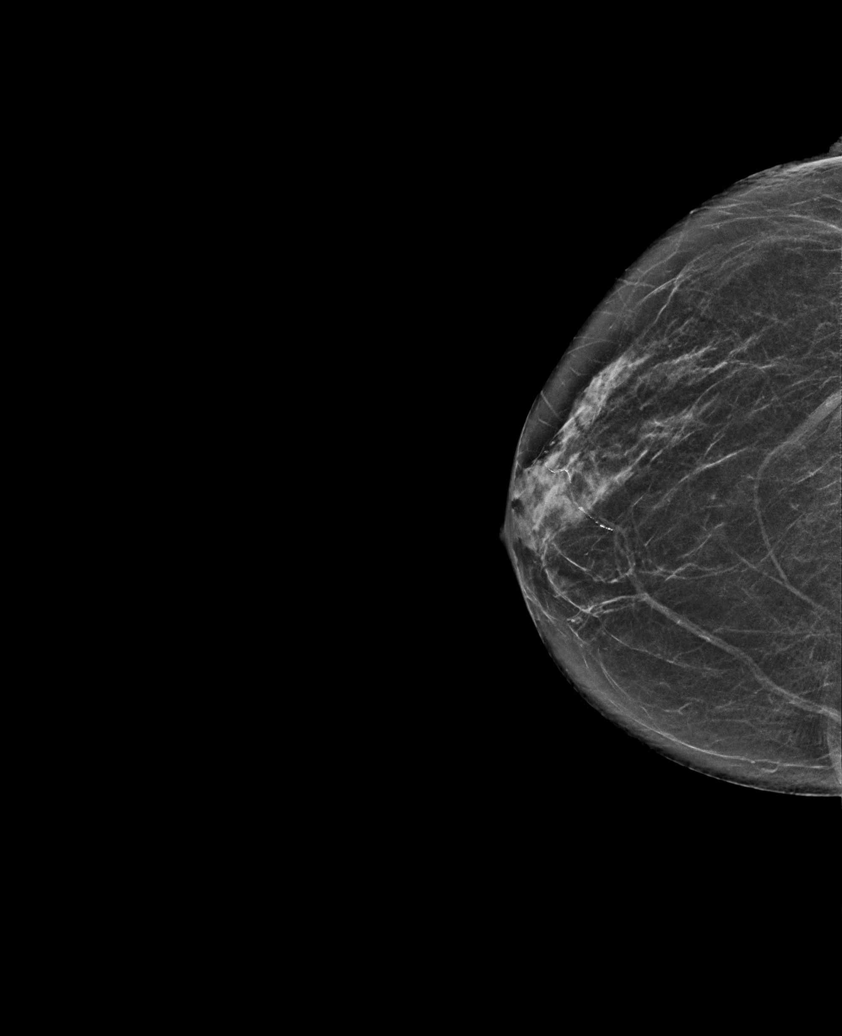

[R MLO synth-2D]
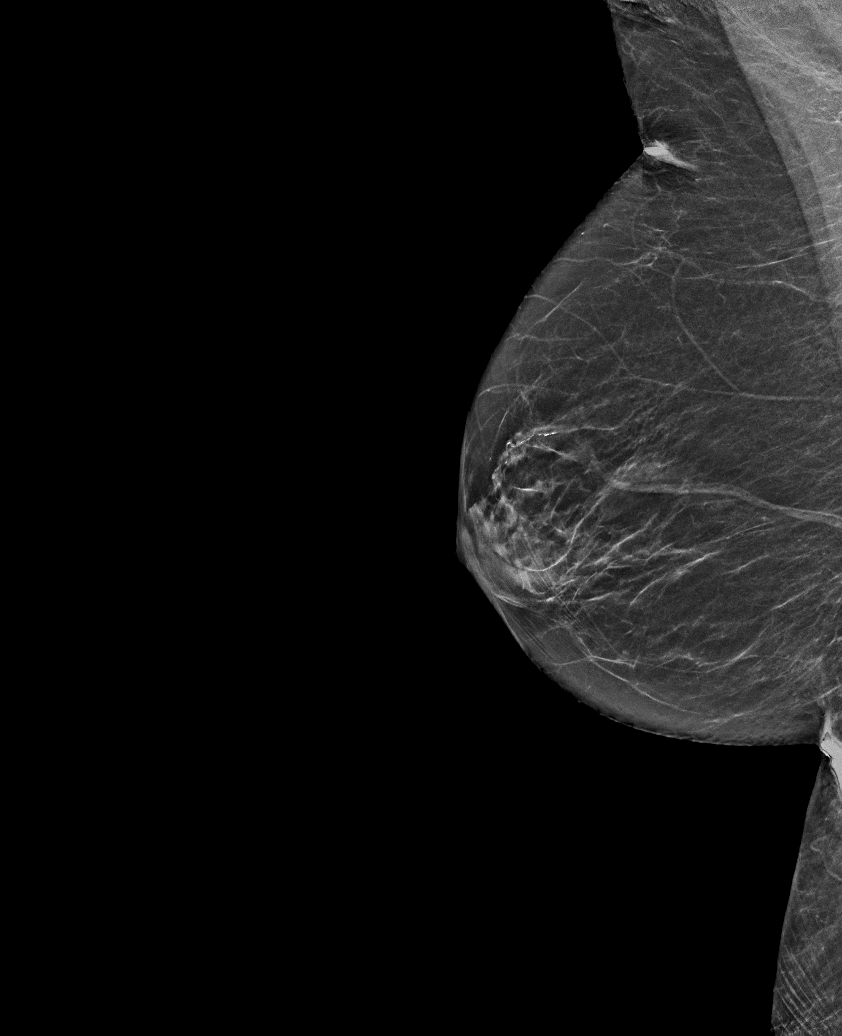

[R CC synth-2D (2 of 2)]
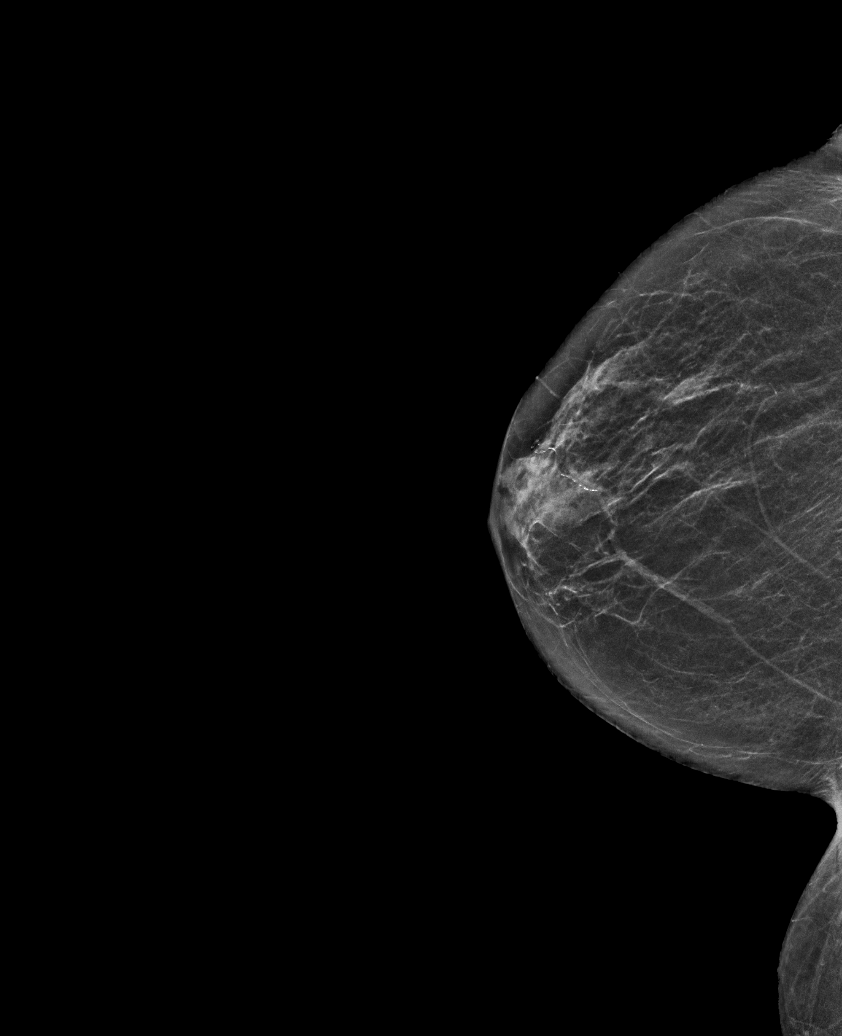

[L CC synth-2D]
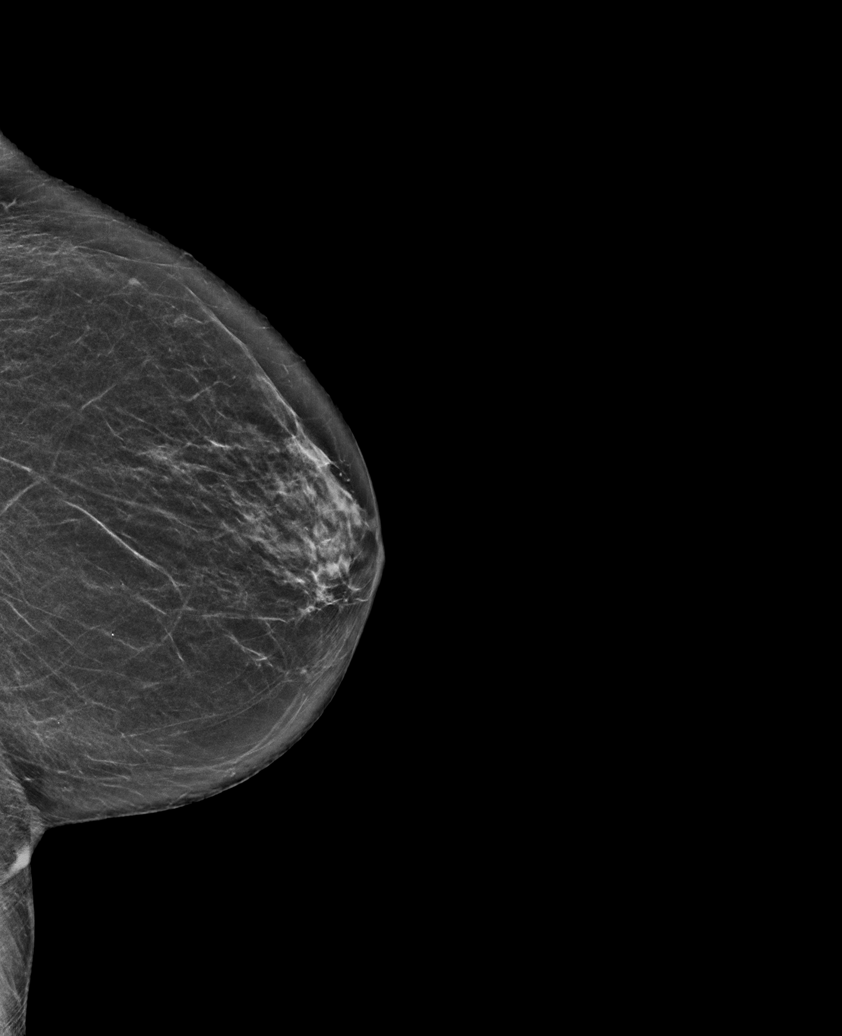

[L MLO tomo · tomo slice 27/53.0]
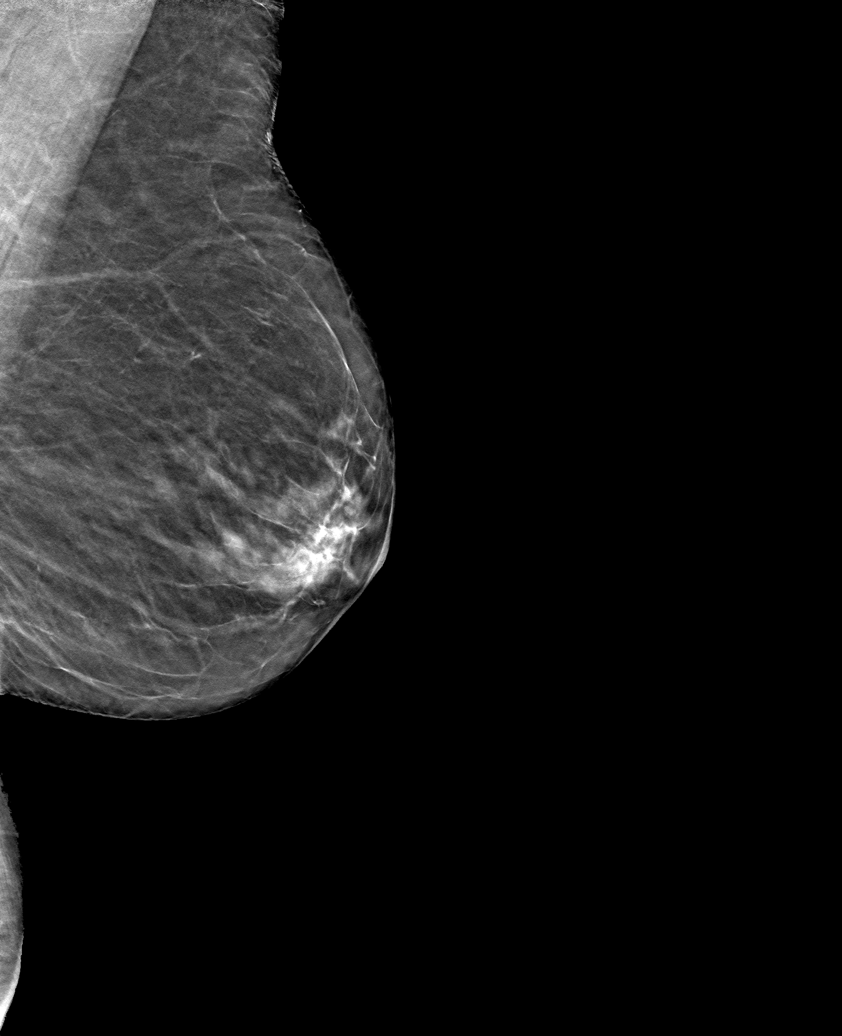

[6 of 30 positions shown; findings below may reference images not displayed]

ACR Breast Density Category b: There are scattered areas of
fibroglandular density.
FINDINGS: There are no findings suspicious for malignancy.
IMPRESSION: No mammographic evidence of malignancy. A result letter of this
screening mammogram will be mailed directly to the patient.

RECOMMENDATION:
Screening mammogram in one year. (Code:[BY])

BI-RADS CATEGORY  1: Negative.

## 2021-09-22 ENCOUNTER — Other Ambulatory Visit: Payer: Self-pay | Admitting: Family Medicine

## 2021-09-22 DIAGNOSIS — M5416 Radiculopathy, lumbar region: Secondary | ICD-10-CM

## 2021-09-23 ENCOUNTER — Other Ambulatory Visit: Payer: Self-pay

## 2021-09-23 ENCOUNTER — Ambulatory Visit
Admission: RE | Admit: 2021-09-23 | Discharge: 2021-09-23 | Disposition: A | Discharge status: Home / Self Care | Payer: Medicare Other | Source: Ambulatory Visit | Attending: Family Medicine | Admitting: Family Medicine

## 2021-09-23 DIAGNOSIS — M5416 Radiculopathy, lumbar region: Secondary | ICD-10-CM | POA: Diagnosis not present

## 2021-09-23 IMAGING — MR MR LUMBAR SPINE W/O CM
5 series · 30 of 48 positions shown · non-contrast
Comparison: CT abdomen/pelvis [DATE]

CLINICAL DATA: Low back pain for 3 months, radiating down left leg,
worsening over the last 3 weeks

EXAM:
MRI LUMBAR SPINE WITHOUT CONTRAST
TECHNIQUE: Multiplanar, multisequence MR imaging of the lumbar spine was
performed. No intravenous contrast was administered.

[Series 9: T2 · sagittal · 4.0mm · 0.81mm/px · 6 of 17 slices shown (1 of 2)]
[im 1/17]
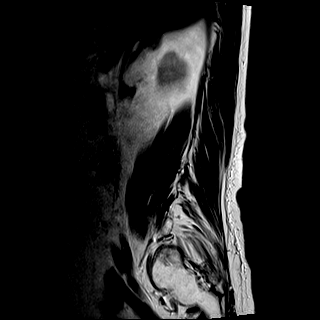
[im 4/17]
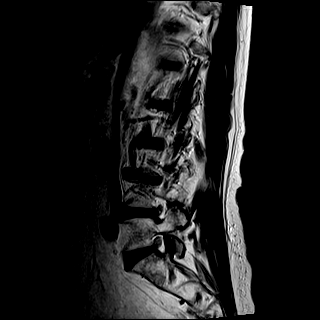
[im 7/17]
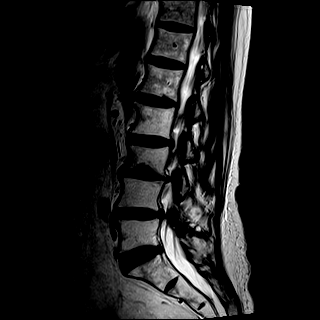
[im 10/17]
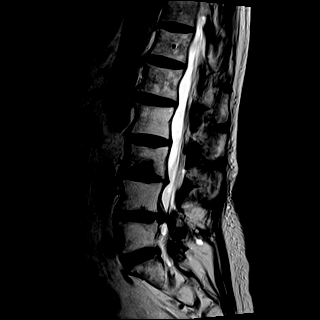
[im 13/17]
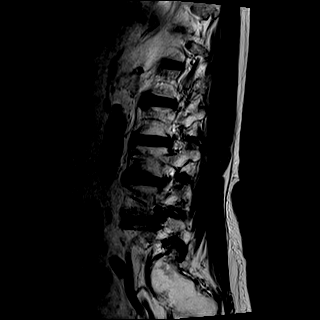
[im 17/17]
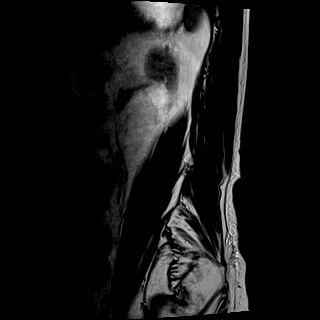

[Series 10: T1 · sagittal · 4.0mm · 0.81mm/px · 7 of 17 slices shown (1 of 2)]
[im 1/17]
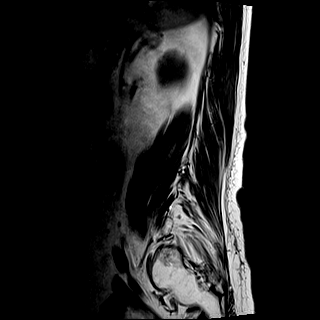
[im 3/17]
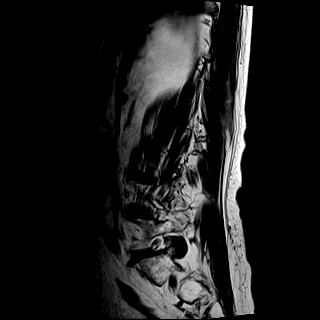
[im 6/17]
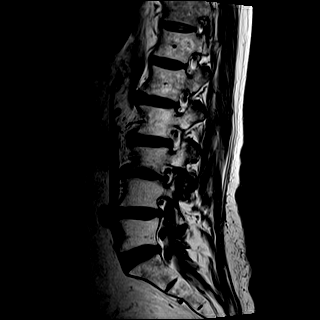
[im 9/17]
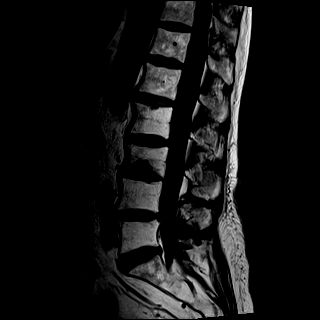
[im 11/17]
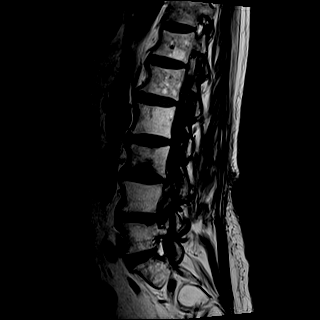
[im 14/17]
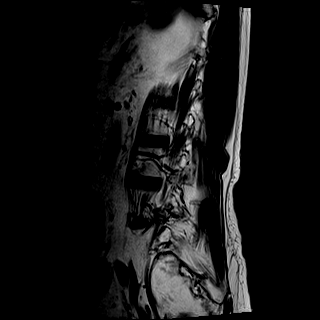
[im 17/17]
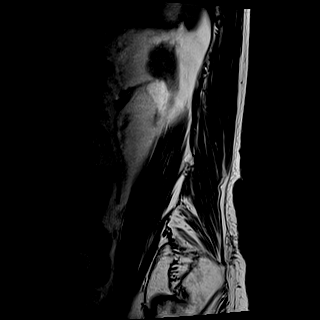

[Series 11: STIR · sagittal · 4.0mm · 0.41mm/px · 1 of 17 slices shown]
[im 1/17]
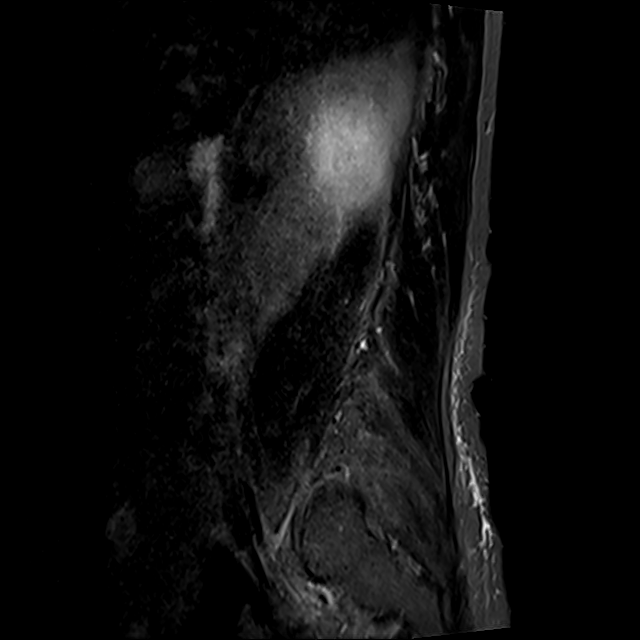

[Series 12: T2 · axial · 4.0mm · 0.78mm/px · z∈[-131,+67]mm · 8 of 35 slices shown (2 of 2)]
[im 1/35]
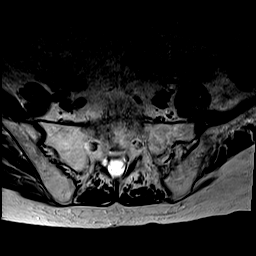
[im 6/35]
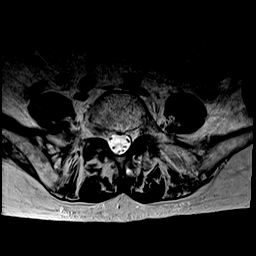
[im 11/35]
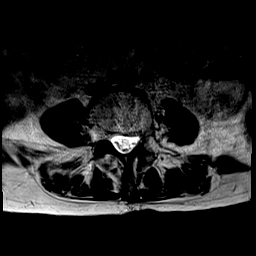
[im 16/35]
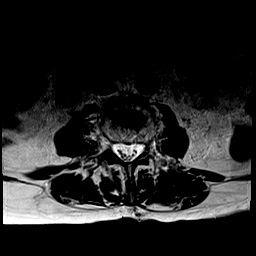
[im 19/35]
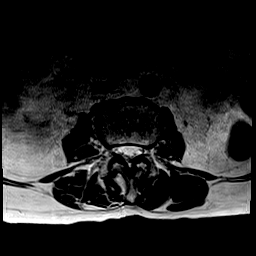
[im 24/35]
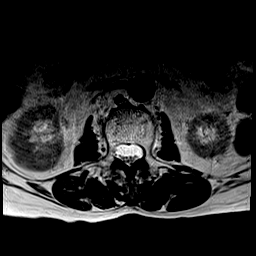
[im 29/35]
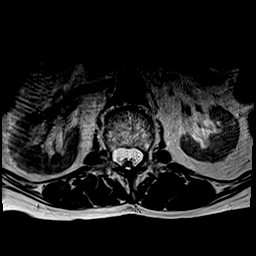
[im 35/35]
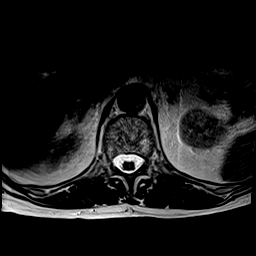

[Series 13: T1 · axial · 4.0mm · 0.39mm/px · z∈[-131,+67]mm · 8 of 35 slices shown (2 of 2)]
[im 1/35]
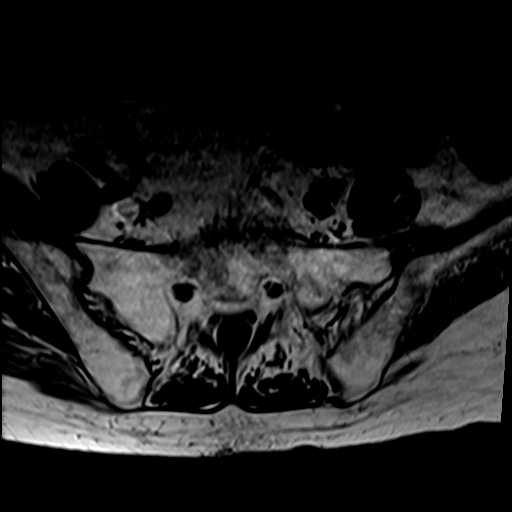
[im 6/35]
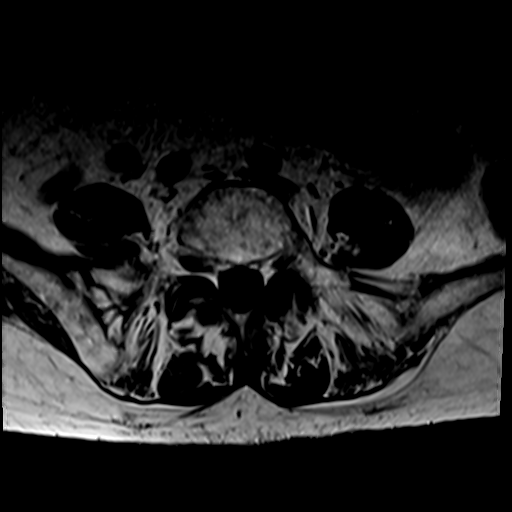
[im 11/35]
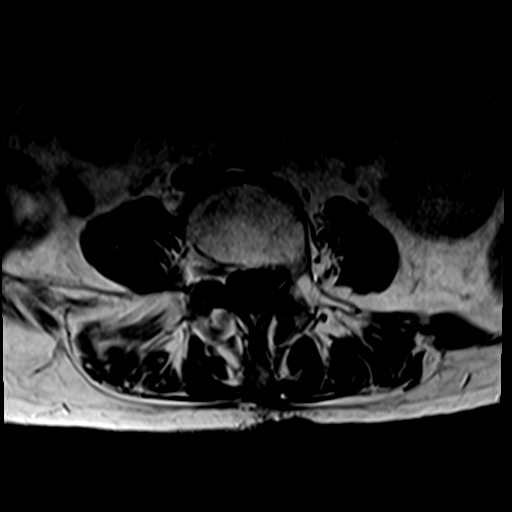
[im 16/35]
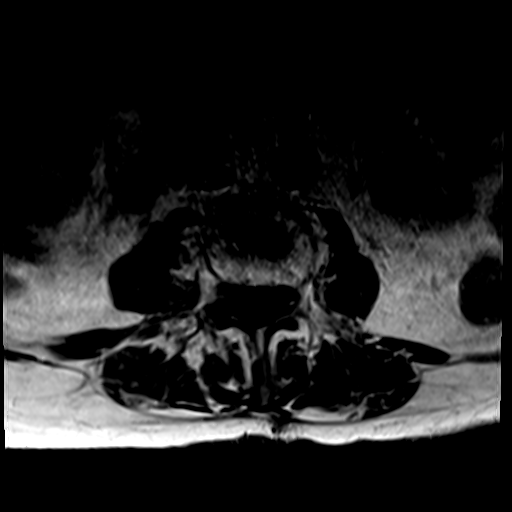
[im 19/35]
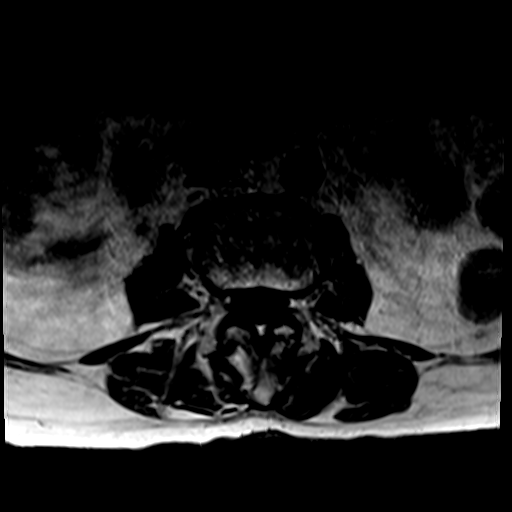
[im 24/35]
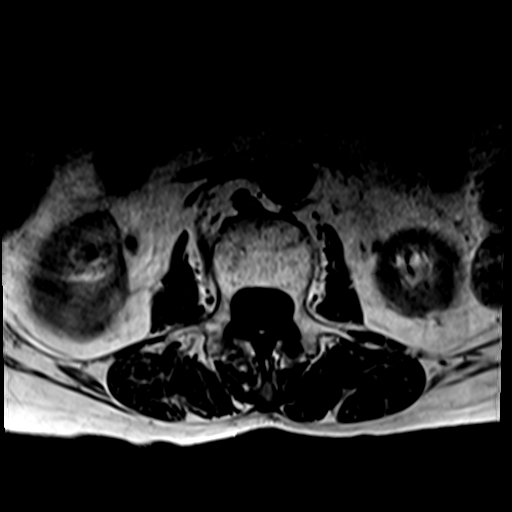
[im 29/35]
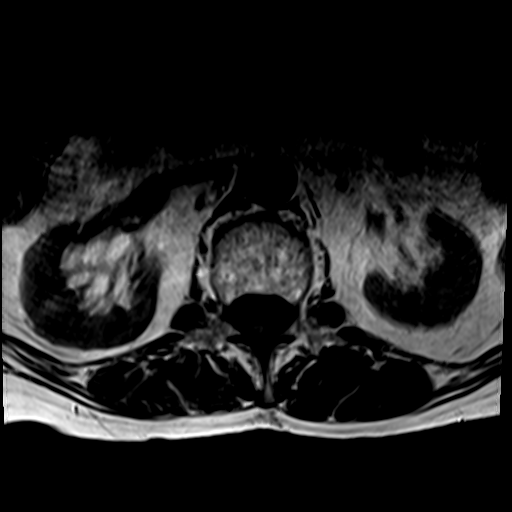
[im 35/35]
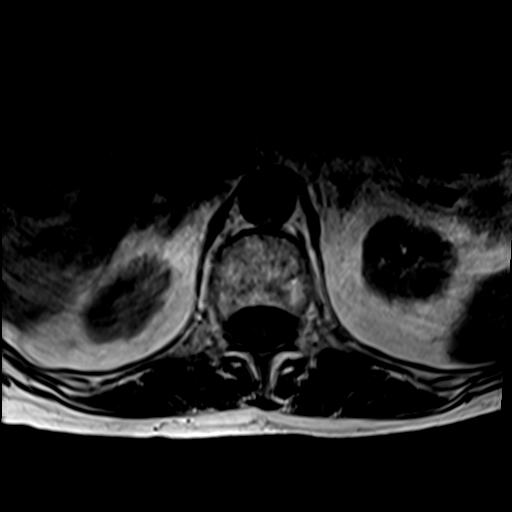

[30 of 48 positions shown; findings below may reference images not displayed]

FINDINGS: Segmentation: Standard; the lowest formed disc space is designated
L5-S1.

Alignment:  Normal.

Vertebrae: There is indentation of the inferior L3 endplate by a
prominent Schmorl's node, unchanged since the prior CT. Otherwise,
vertebral body heights are preserved. There is no suspicious marrow
signal abnormality. There is no marrow edema.

Conus medullaris and cauda equina: Conus extends to the L1-L2 level.
Conus and cauda equina appear normal.

Paraspinal and other soft tissues: Unremarkable.

Disc levels:

There is mild multilevel disc desiccation without significant loss
of height. There is multilevel facet arthropathy, most advanced at
L4-L5 with a small effusion on the right. There is a probable small
synovial cyst on the left, external to the spinal canal.

T12-L1: No significant spinal canal or neural foraminal stenosis.

L1-L2: There is a minimal disc bulge and mild bilateral facet
arthropathy without significant spinal canal or neural foraminal
stenosis

L2-L3: There is a mild disc bulge and left worse than right facet
arthropathy without significant spinal canal or neural foraminal
stenosis.

L3-L4: There is a diffuse disc bulge and moderate bilateral facet
arthropathy resulting in narrowing of the right subarticular zone
with possible irritation of the traversing right L4 nerve root and
mild right and no significant left neural foraminal stenosis.

L4-L5: There is a diffuse disc bulge with a left foraminal extrusion
and moderate bilateral facet arthropathy resulting in moderate
spinal canal stenosis with effacement of the left subarticular zone
and impingement of the traversing left L5 nerve root, and severe
left neural foraminal stenosis with impingement of the exiting L4
nerve root. There is mild right neural foraminal stenosis.

L5-S1: There is a mild disc bulge with a central annular fissure and
bilateral facet arthropathy without significant spinal canal or
neural foraminal stenosis.
IMPRESSION: 1. Diffuse disc bulge with a left foraminal extrusion at L4-L5
resulting in effacement of the left subarticular zone and severe
left neural foraminal stenosis with impingement of the exiting L4
nerve root and traversing L5 nerve root, which may account for the
patient's symptoms.
2. Narrowing of the right subarticular zone at L3-L4 with possible
irritation of the traversing right L4 nerve root.
3. Facet arthropathy most advanced at L4-L5 with a small effusion on
the right.

## 2021-10-18 ENCOUNTER — Telehealth: Payer: Self-pay

## 2021-10-18 NOTE — Telephone Encounter (Signed)
Spoke with patient's daughter Jeanette Newton and scheduled a Mychart Palliative Consult for 10/26/21 @ 2:30 PM.  Consent obtained; updated Outlook/Netsmart/Team List and Epic.

## 2021-10-18 NOTE — Telephone Encounter (Signed)
Attempted to contact patient's daughter [name] to schedule a Palliative Care consult appointment. No answer left a message to return call.

## 2021-10-26 ENCOUNTER — Telehealth: Payer: Medicare Other | Admitting: Nurse Practitioner

## 2021-10-26 ENCOUNTER — Encounter: Payer: Self-pay | Admitting: Nurse Practitioner

## 2021-10-26 ENCOUNTER — Other Ambulatory Visit: Payer: Self-pay

## 2021-10-26 DIAGNOSIS — Z515 Encounter for palliative care: Secondary | ICD-10-CM

## 2021-10-26 DIAGNOSIS — F0280 Dementia in other diseases classified elsewhere without behavioral disturbance: Secondary | ICD-10-CM

## 2021-10-26 DIAGNOSIS — R531 Weakness: Secondary | ICD-10-CM

## 2021-10-26 DIAGNOSIS — R630 Anorexia: Secondary | ICD-10-CM

## 2021-10-26 NOTE — Progress Notes (Signed)
[facility] Pall[facility] Consult Note Telephone: [phone]  Fax: [phone]   Date of encounter: 10/26/21 7:33 PM PATIENT NAME: Jeanette Newton [place] [address]   [phone] (home)  DOB: 11/16/1927 MRN: [id number] PRIMARY CARE PROVIDER:    Rusty Aus, MD,  [place] [address] [phone]  REFERRING PROVIDER:   Rusty Aus, MD [place],  [place] [address] [phone]  RESPONSIBLE PARTY:    Contact Information     Name Relation Home Work [place] Daughter [phone]  [phone]      Due to the COVID-19 crisis, this visit was done via telemedicine from my office and it was initiated and consent by this patient and or family.  I connected with [name], Ms. Davitt daughter with  JAJAIRA RUIS [employer] on 10/26/21 by a telephone as video not available enabled telemedicine application and verified that I am speaking with the correct person using two identifiers.   I discussed the limitations of evaluation and management by telemedicine. The patient expressed understanding and agreed to proceed.  Palliative Care was asked to follow this patient by consultation request of  Rusty Aus, MD to address advance care planning and complex medical decision making. This is the initial visit.                              ASSESSMENT AND PLAN / RECOMMENDATIONS:  Advance Care Planning/Goals of Care: Goals include to maximize quality of life and symptom management. Patient/health care surrogate gave his/her permission to discuss.Our advance care planning conversation included a discussion about:    The value and importance of advance care planning  Experiences with loved ones who have been seriously ill or have died  Exploration of personal, cultural or spiritual beliefs that might  influence medical decisions  Exploration of goals of care in the event of a sudden injury or illness  Identification  of a healthcare agent  Review and updating or creation of an  advance directive document . Decision not to resuscitate or to de-escalate disease focused treatments due to poor prognosis. CODE STATUS: DNR  Symptom Management/Plan: 1. Advance Care Planning; Discussed code status, [name], Daughter wishes for DNR, will complete goldenrod form, put in vynca, sent to [facility] as well as blank MOST form reviewed and sent.   2. Generalized weakness secondary to   3. 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 Goals of Care: Goals include to maximize quality of life and symptom management. Our advance care planning conversation included a discussion about:    The value and importance of advance care planning  Exploration of personal, cultural or spiritual beliefs that might influence medical decisions  Exploration of goals of care in the event of a sudden injury or illness  Identification and preparation of a healthcare agent  Review and updating or creation of an advance directive document.  5. Palliative care encounter; Palliative care encounter; Palliative medicine team will continue to support patient, patient's family, and medical team. Visit consisted of counseling and education dealing with the complex and emotionally intense issues of symptom management and palliative care in the setting of serious and potentially life-threatening illness  Follow up Palliative Care Visit: Palliative care will continue to follow for complex medical decision making, advance care planning, and clarification of goals. Return 4 weeks or prn.  I spent 62 minutes providing this consultation. More than 50% of the time in this  consultation was spent in counseling and care coordination. PPS: 50%  Chief Complaint: Initial palliative consult for complex medical decision making  HISTORY OF PRESENT ILLNESS:  Jeanette Newton is a 88 y.o. 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 Jeanette Newton had a epidural 10/24/2021 for acute on chronic low back pain with radiation to left buttock, posterior lateral thigh, calf consistent with lumbar radiculitis. I called [name], Ms. [place]k daughter with Ms. Bearce by telephone as video is not available for initial telemedicine palliative consult. We talked about purpose of pc visit, last time Jeanette Newton was independent, past medical history, disease progression of dementia, functional and cognitive changes. We talked about ros, symptoms, appetite, sleeping. We talked about medications. We talked about medical goals including code status, wishes are for DNR, golden rod form completed sent to [facility]. Reviewed MOST form, will send blank copy to review. We talked about Jeanette Newton ambulating, pending PT/speech with home health services with [facility] ordered by Dr Manuella Ghazi. Discussed Jeanette Newton is widowed with 1 adult daughter [name] who is HCPOA. Medical goals reviewed. We talked about role pc in poc. Discussed f/u pc visit with palliative RN in 4 weeks for monitoring weights, disease progression and to see how Jeanette Newton is doing with therapy. [name] in agreement, therapeutic listening, emotional support provided. Questions answered.    History obtained from review of EMR, discussion with [name] Jeanette Newton daughter with Jeanette Newton.  I reviewed available labs, medications, imaging, studies and related documents from the EMR.  Records reviewed and summarized above.   ROS 10  point system reviewed with Jeanette Newton. [place] daughter with Jeanette Newton all negative except HPI  Physical Exam: deferred CURRENT PROBLEM LIST:  Patient Active Problem List   Diagnosis Date Noted   Bowel perforation ([place]) 05/30/2019   Calculus of gallbladder with acute cholecystitis without obstruction 05/15/2016   PAST MEDICAL HISTORY:  Active Ambulatory Problems    Diagnosis Date Noted   Calculus of gallbladder with acute cholecystitis without obstruction 05/15/2016   Bowel perforation ([place]) 05/30/2019   Resolved Ambulatory Problems    Diagnosis Date Noted   No Resolved Ambulatory Problems   Past Medical History:  Diagnosis Date   Cancer ([place]) 2005-2006   Hypothyroidism    Status post chemoradiation    Status post radiation therapy    Thyroid disease    Wears hearing aid in both ears    SOCIAL HX:  Social History   Tobacco Use   Smoking status: Never   Smokeless tobacco: Never  Substance Use Topics   Alcohol use: No   FAMILY HX:  Family History  Problem Relation Age of Onset   Breast cancer Neg Hx       ALLERGIES:  Allergies  Allergen Reactions   Clarithromycin Nausea Only    Biaxin   Clindamycin Nausea Only   Ciprofloxacin Swelling and Rash    Pt states she can take this medication   Doxycycline Hyclate Rash   Etodolac Rash   Levaquin [Levofloxacin In D5w] Rash   Penicillins Rash  Propranolol Itching and Rash     PERTINENT MEDICATIONS:  Outpatient Encounter Medications as of 10/26/2021  Medication Sig   aspirin EC 81 MG tablet Take 81 mg by mouth daily.   Calcium-Vitamin D 600-200 MG-UNIT tablet Take 1 tablet by mouth 2 (two) times daily.    Cholecalciferol (VITAMIN D3) 50 MCG (2000 UT) TABS Take 2,000 Units by mouth daily.   HYDROcodone-acetaminophen (NORCO/VICODIN) 5-325 MG tablet Take 1 tablet by mouth every 6 (six) hours as needed for moderate pain.   levothyroxine (SYNTHROID, LEVOTHROID) 75 MCG tablet Take 75 mcg by mouth daily before  breakfast.    Omega-3 Fatty Acids (FISH OIL PO) Take 1 capsule by mouth 2 (two) times daily.    vitamin B-12 (CYANOCOBALAMIN) 1000 MCG tablet Take 1,000 mcg by mouth daily.   No facility-administered encounter medications on file as of 10/26/2021.   Thank you for the opportunity to participate in the care of Ms. Biedermann.  The palliative care team will continue to follow. Please call our office at [phone] if we can be of additional assistance.   Evely Gainey Z Bush Murdoch, NP ,

## 2022-05-03 ENCOUNTER — Other Ambulatory Visit: Payer: Medicare Other | Admitting: Nurse Practitioner

## 2022-05-03 ENCOUNTER — Encounter: Payer: Self-pay | Admitting: Nurse Practitioner

## 2022-05-03 DIAGNOSIS — F0280 Dementia in other diseases classified elsewhere without behavioral disturbance: Secondary | ICD-10-CM

## 2022-05-03 DIAGNOSIS — R630 Anorexia: Secondary | ICD-10-CM

## 2022-05-03 DIAGNOSIS — Z515 Encounter for palliative care: Secondary | ICD-10-CM

## 2022-05-03 NOTE — Progress Notes (Signed)
[facility] Palliative [facility] Consult Note Telephone: [phone]  Fax: [phone]    Date of encounter: 05/03/22 3:37 PM PATIENT NAME: Jeanette Newton [place] [address]   [phone] (home)  DOB: 86/30/1929 MRN: [id number] PRIMARY CARE PROVIDER:    Rusty Aus, MD,  [place] [address] [phone]  RESPONSIBLE PARTY:    Contact Information     Name Relation Home Work [place] Daughter [phone]  [phone]      I met face to face with patient and family in home. Palliative Care was asked to follow this patient by consultation request of  Rusty Aus, MD to address advance care planning and complex medical decision making. This is a follow up visit.                                  ASSESSMENT AND PLAN / RECOMMENDATIONS:  Advance Care Planning/Goals of Care: Goals include to maximize quality of life and symptom management. Patient/health care surrogate gave his/her permission to discuss. Our advance care planning conversation included a discussion about:    The value and importance of advance care planning  Experiences with loved ones who have been seriously ill or have died  Exploration of personal, cultural or spiritual beliefs that might influence medical decisions  Exploration of goals of care in the event of a sudden injury or illness  Identification of a healthcare agent  Review and updating or creation of an  advance directive document . Decision not to resuscitate or to de-escalate disease focused treatments due to poor prognosis. CODE STATUS: DNR  Symptom Management/Plan: 1. Advance Care Planning;  DNR, goldenrod  in home  2. Goals of Care: Goals include to maximize quality of life and symptom management. Our advance care planning conversation included a discussion about:    The value and importance of advance care planning   Exploration of personal, cultural or spiritual beliefs that might influence medical decisions  Exploration of goals of care in the event of a sudden injury or illness  Identification and preparation of a healthcare agent  Review and updating or creation of an advance directive document.  3.  Dementia progressive, continue to monitor, supportive role. Discussed multiple support groups, [employer], activities including puzzles, coloring using larger crayons. Talked about using weighted utensils to help stabilize tremor. We talked about Jeanette Newton staying by herself, Jeanette Newton has cameras in the home. We talked about daily routine, medical goals, quality of life. We talked about long term planning and hope is to keep at home as long as possible, currently Jeanette Newton is primary caregiver with help of other family members. Support provided.   4. Anorexia, declined though does not appear to have lost weight, nutrition discussed, meals, food likes, routines. Continue to monitor weights.   02/08/2022 weight 134 lbs 09/07/2021 weight 133.9 lbs 10/24/2021 weight 128 lbs  5. Palliative care encounter; Palliative care encounter; Palliative medicine team will continue to support patient, patient's family, and medical team. Visit consisted of counseling and education dealing with the complex and emotionally intense issues of symptom management and palliative care in the setting of serious and potentially life-threatening illness Total time spent on today's visit was   minutes dedicated to this patient today, preparing to see patient, examining the patient, ordering tests and/or medications and counseling the  patient, documenting clinical information in the EHR or other health record, independently interpreting results and communicating results to the patient/family, discussing treatment and goals, answering patient's questions and coordinating care.  Follow up Palliative Care Visit: Palliative care will continue  to follow for complex medical decision making, advance care planning, and clarification of goals. Return 12 weeks or prn.  I spent 61 minutes providing this consultation. More than 50% of the time in this consultation was spent in counseling and care coordination. PPS: 50% Chief Complaint: Follow up palliative consult for complex medical decision making, address goals, manage ongoing symptoms  HISTORY OF PRESENT ILLNESS:  Jeanette Newton is a 88 y.o. 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 [name] had a epidural 10/24/2021 for acute on chronic low back pain with radiation to left buttock, posterior lateral thigh, calf consistent with lumbar radiculitis. I called Jeanette Nevin, Ms. [place] daughter to confirm in home visit, in agreement. I visited Jeanette Newton and [place] in Jeanette Newton home. We talk about how Jeanette Newton has been feeling, ros, symptoms, cognitive and functional abilities. Jeanette Newton is able to shower, get herself dressed. She does use a walker if they go out in the community, reviewed using a cane. No recent falls, wounds, hospitalizations, infections. We talked about appetite, weights, nutrition. We talked about DNR, medical goals including LTC planning. We talked about activities, support groups, f/u pc visit. [name] in agreement, scheduled. Therapeutic listening emotional support provided. Questions answered and contact information provided. We talked about caregiver stress, fatigue, self care.   History obtained from review of EMR, discussion with primary team, and interview with family, facility staff/caregiver and/or Jeanette Newton.  I reviewed available labs, medications, imaging, studies and related documents from the EMR.  Records  reviewed and summarized above.   ROS 10 point system reviewed all negative except HPI  Physical Exam: Constitutional: NAD General: frail appearing,  pleasant female EYES: lids intact ENMT: oral mucous membranes moist CV: S1S2, RRR Pulmonary: LCTA Abdomen: soft and non tender MSK: ambulatory with walker at times Skin: warm and dry Neuro:  no generalized weakness,  + cognitive impairment Psych: non-anxious affect, A and O x 2, forgetful Thank you for the opportunity to participate in the care of Ms. Mckenna.  The palliative care team will continue to follow. Please call our office at [phone] if we can be of additional assistance.   Zoraida Havrilla Ihor Gully, NP

## 2022-06-06 ENCOUNTER — Encounter: Payer: Medicare Other | Attending: Neurology | Admitting: Dietician

## 2022-06-06 DIAGNOSIS — R634 Abnormal weight loss: Secondary | ICD-10-CM | POA: Insufficient documentation

## 2022-06-06 DIAGNOSIS — E079 Disorder of thyroid, unspecified: Secondary | ICD-10-CM | POA: Insufficient documentation

## 2022-06-06 DIAGNOSIS — F0390 Unspecified dementia without behavioral disturbance: Secondary | ICD-10-CM | POA: Diagnosis not present

## 2022-06-06 DIAGNOSIS — Z713 Dietary counseling and surveillance: Secondary | ICD-10-CM | POA: Insufficient documentation

## 2022-06-06 DIAGNOSIS — Z859 Personal history of malignant neoplasm, unspecified: Secondary | ICD-10-CM | POA: Insufficient documentation

## 2022-06-06 NOTE — Patient Instructions (Addendum)
Get more protein from foods that help your body heal and grow strong.  Aim to include protein in every meal and snack.  Eat small, frequent meals and snacks. Try to eat a snack between each meal- It can be something small! Aim to include a protein and carb at snacks  Consider trying a chair workout on youtube.   Consider adding butter or peanut butter to your muffin.   Consider having a snack between breakfast and lunch.   Drinks/supplements to add calories and protein: -Gatorade zero with protein -Gelatein jello cup.  -Ensure clear (juice based ensure) -Consider making a "milkshake-style" smoothie with 1 frozen banana, spoon of peanut butter, cocoa powder and milk of choice to sip on after breakfast.  Switch your buns to whole wheat.

## 2022-06-06 NOTE — Progress Notes (Signed)
Medical Nutrition Therapy  Appointment Start time:  1112  Appointment End time:  1212  Primary concerns today: Pt's daughter states that she is concerned about the pt's food choices and recent weight loss.   Referral diagnosis: abnormal weight loss Preferred learning style: no preference indicated) Learning readiness: ready   NUTRITION ASSESSMENT   Anthropometrics  Ht: 62in Wt: 126.7lbs  Clinical Medical Hx: cancer, thyroid disease, dementia Medications: reviewed Labs: no labs to review Notable Signs/Symptoms: unintentional weight loss  Lifestyle & Dietary Hx  Pt is present today with her daughter who is providing most of the information due to patient having some dementia.  Pt lives alone and is mostly independent still put pt's daughter goes over 1-2 times per day to help make sure pt has food to eat and is doing okay.    Pt has had unintentional weight loss of 10 lbs over the last 3 months. Pt's daughter states she is able to notice this weight loss on the pt and with the pt's clothes.   Pt states she is a little less hungry and has a lower appetite than she used to.   Pt's daughter states the pt was always busy, working in the yard, garden, Pharmacist, hospital, so pt does not watch TV but is more sedentary now.   Pt's daughter states she tries to use full fat milk when baking for the pt and tries to encourage eating when possible. Pt does not like milky products (milk, yogurt, ensure) but will drink a milkshake.   Estimated daily fluid intake: 16 oz Supplements: vitamin B12, vitamin D3, omega 3's, calcium vitamin D.  Sleep: 9 hours Stress / self-care: mild stress Current average weekly physical activity: ADLs  24-Hr Dietary Recall First Meal: 7am: banana nut muffin Snack: none Second Meal: 12pm: air fried chicken on bun or hamburger patty on bun with mayo OR peanut butter sandwich  Snack: none Third Meal: 5pm: chicken on bun OR hamburger on bun OR grilled chicken  salad Snack: none Beverages: sweet tea with meals, occasional pepsi, 16oz water   NUTRITION DIAGNOSIS  NB-1.1 Food and nutrition-related knowledge deficit As related to abnormal weight loss.  As evidenced by pt report and diet history.   NUTRITION INTERVENTION  Nutrition education (E-1) on the following topics:  MyPlate Balance of protein, carbohydrate, and non-starchy vegetables Building balanced snacks Importance of adequate protein for maintaining weight and muscle mass Tips on adding protein and calories to meals Importance of adequate hydration  Handouts Provided Include  Nutrition Care Manual: High-calorie, high-protein nutrition therapy Nutrition Care Manual: underweight nutrition therapy Dish Up a Healthy Meal Snack Suggestions  Learning Style & Readiness for Change Teaching method utilized: Visual & Auditory  Demonstrated degree of understanding via: Teach Back  Barriers to learning/adherence to lifestyle change: none  Goals Established by Pt Get more protein from foods that help your body heal and grow strong.  Aim to include protein in every meal and snack.  Eat small, frequent meals and snacks. Try to eat a snack between each meal- It can be something small! Aim to include a protein and carb at snacks  Consider trying a chair workout on youtube.   Consider adding butter or peanut butter to your muffin.   Consider having a snack between breakfast and lunch.   Drinks/supplements to add calories and protein: -Gatorade zero with protein -Gelatein jello cup.  -Ensure clear (juice based ensure) -Consider making a "milkshake-style" smoothie with 1 frozen banana, spoon of peanut butter,  cocoa powder and milk of choice to sip on after breakfast.  Switch your buns to whole wheat.   MONITORING & EVALUATION Dietary intake, weekly physical activity, and follow up as needed.   Next Steps  Patient is to call for questions.

## 2022-08-07 ENCOUNTER — Other Ambulatory Visit: Payer: Medicare Other

## 2022-08-07 VITALS — BP 122/60 | HR 60 | Temp 97.5°F

## 2022-08-07 DIAGNOSIS — Z515 Encounter for palliative care: Secondary | ICD-10-CM

## 2022-08-07 NOTE — Progress Notes (Signed)
PATIENT NAME: Jeanette Newton DOB: 1928-05-30 MRN: [id number]  PRIMARY CARE PROVIDER: Rusty Aus, MD  RESPONSIBLE PARTY:  Acct ID - Guarantor Home Phone Work Phone Relationship Acct Type  [id number] - Sazama,ER* [phone]  Self P/F     [address], [place], [place] [address]   Anorexia:  Noted 10 lb weight loss.  Nutrition consult completed.  Daughter has tried to incorporate additional foods into patient's diet but notes she is a picky eater.   Patient has GI evaluation pending to ensure no other cause for weight loss.   Dementia:  Living at home independently.  Daughter reports worsening forgetfulness.  Sometimes she forgets people's names.  No wandering.  Patient continues to complete dressing, grooming and hygiene on her own.  No longer cooking or driving.  Daughter assisting with some household chores.  Remains continent of bowel and bladder.   Education provided on disease progression.  Will bring dementia notebook on next visit.   Medication Management:  Patient is currently on synthroid but is not remembering to take it before she eats.  Daughter is leaving a note with pill but patient is still forgetting.  Daughter will administer this when she arrives later in the morning.  Education provided on purpose of taking this prior to eating to allow for better absorption.      CODE STATUS: DNR-form in the home. ADVANCED DIRECTIVES: Yes-not on file MOST FORM: No PPS: 50%   PHYSICAL EXAM:   VITALS: Today's Vitals   08/07/22 1046  BP: 122/60  Pulse: 60  Temp: (!) 97.5 F (36.4 C)  SpO2: 98%    LUNGS: clear to auscultation  CARDIAC: Cor RRR}  EXTREMITIES: - for edema SKIN: Skin color, texture, turgor normal. No rashes or lesions or mobility and turgor normal  NEURO: positive for memory problems       Lorenza Burton, RN

## 2022-08-09 ENCOUNTER — Telehealth: Payer: Self-pay

## 2022-08-09 NOTE — Telephone Encounter (Signed)
1 pm.  Phone call made to [facility] to ensure referral was received from Dr. Manuella Ghazi last month.  Spoke with [facility] Janus who advised they have received the referral.  It will likely be January or February before patient is contacted.  Update provided to daughter [name].

## 2022-09-04 ENCOUNTER — Other Ambulatory Visit: Payer: Medicare Other

## 2022-09-04 VITALS — BP 124/68 | HR 68 | Temp 97.9°F | Wt 133.0 lb

## 2022-09-04 DIAGNOSIS — Z515 Encounter for palliative care: Secondary | ICD-10-CM

## 2022-09-04 NOTE — Progress Notes (Signed)
PATIENT NAME: Jeanette Newton DOB: Oct 06, 1927 MRN: [id number]  PRIMARY CARE PROVIDER: Rusty Aus, MD  RESPONSIBLE PARTY:  Acct ID - Guarantor Home Phone Work Phone Relationship Acct Type  [id number] - Marschall,ER* [phone]  Self P/F     [address] [place], [place], [place] [address]   Home visit completed with patient, daughter and Lorelee Market, LPN.  Dementia:  Ongoing education provided on disease progression.  Daughter to start with support group/education group this Friday at Dr. Trena Platt office.  Continues with namenda.  Continues to live home alone with daughter checking in frequently during the day.  Able to feed and dress herself.  Daughter is assisting with meal preparations.   Edema:  1+ bilateral ankle edema present.  Diuretic currently prn due to worsening kidney functions per daughter.  Has not recently received any medication.  Discussed when to administer and checking ankles routinely for edema in addition to weighing routinely.  Medication Management:  See below for new meds.  Daughter continues to manage medications.  Now giving synthroid to patient when she arrives in the morning as patient is unable to remember to take 30 minutes prior to meal.  Weight Loss:  Seen by GI yesterday for weight loss and started on benefiber and Remeron.  Daughter hesitant to start Remeron due to side effects of constipation, drowsiness and dizziness. Patient already had issues with constipation.  Living alone and concern for safety with drowsiness and dizziness.   Will start with benefiber first and monitor weights weekly.  She will start Remeron if needed.  Weight currently stable at 133 lbs.  CODE STATUS: DNR ADVANCED DIRECTIVES: Yes-not on file MOST FORM: No PPS: 50%   PHYSICAL EXAM:   VITALS: Today's Vitals   09/04/22 1005  BP: 124/68  Pulse: 68  Temp: 97.9 F (36.6 C)  SpO2: 96%  Weight: 133 lb (60.3 kg)    LUNGS: clear to auscultation  no rales, wheezing or rhonchi  noted. CARDIAC: Cor RRR}  EXTREMITIES: 1+ bilateral ankle edema SKIN: Skin color, texture, turgor normal. No rashes or lesions or mobility and turgor normal  NEURO: positive for memory problems       Lorenza Burton, RN

## 2022-09-06 ENCOUNTER — Ambulatory Visit: Payer: Medicare Other | Admitting: Speech Pathology

## 2022-09-06 ENCOUNTER — Ambulatory Visit: Payer: Medicare Other | Attending: Neurology

## 2022-09-06 DIAGNOSIS — R41841 Cognitive communication deficit: Secondary | ICD-10-CM | POA: Diagnosis present

## 2022-09-06 DIAGNOSIS — R269 Unspecified abnormalities of gait and mobility: Secondary | ICD-10-CM

## 2022-09-06 DIAGNOSIS — R278 Other lack of coordination: Secondary | ICD-10-CM | POA: Insufficient documentation

## 2022-09-06 DIAGNOSIS — R262 Difficulty in walking, not elsewhere classified: Secondary | ICD-10-CM

## 2022-09-06 DIAGNOSIS — M6281 Muscle weakness (generalized): Secondary | ICD-10-CM

## 2022-09-06 NOTE — Therapy (Signed)
OUTPATIENT PHYSICAL THERAPY NEURO EVALUATION   Patient Name: Jeanette Newton MRN: [id number] DOB:22-Jan-1928, 89 y.o., female Today's Date: 09/07/2022   PCP: Dr. Emily Filbert REFERRING PROVIDER: Dr. Jennings Books  END OF SESSION:  PT End of Session - 09/06/22 1010     Visit Number 1    Number of Visits 24    Date for PT Re-Evaluation 11/29/22    Progress Note Due on Visit 10    PT Start Time 1000    PT Stop Time 1055    PT Time Calculation (min) 55 min    Equipment Utilized During Treatment Gait belt    Activity Tolerance Patient tolerated treatment well    Behavior During Therapy WFL for tasks assessed/performed             Past Medical History:  Diagnosis Date   Cancer ([place]) 2005-2006   lymphoma   Hypothyroidism    Status post chemoradiation    lymphoma   Status post radiation therapy    Thyroid disease    Wears hearing aid in both ears    Past Surgical History:  Procedure Laterality Date   ABDOMINAL EXPLORATION SURGERY  2005-2006   Dr Pat Patrick   CATARACT EXTRACTION [facility] Left 07/28/2019   Procedure: CATARACT EXTRACTION PHACO AND INTRAOCULAR LENS PLACEMENT ([place]) LEFT 7.83  00:53.4;  Surgeon: Birder Robson, MD;  Location: [facility];  Service: Ophthalmology;  Laterality: Left;   CATARACT EXTRACTION W/PHACO Right 08/25/2019   Procedure: CATARACT EXTRACTION PHACO AND INTRAOCULAR LENS PLACEMENT ([facility]) RIGHT 6.04 00:41.0;  Surgeon: Birder Robson, MD;  Location: [facility];  Service: Ophthalmology;  Laterality: Right;   CHOLECYSTECTOMY N/A 05/25/2016   Procedure: LAPAROSCOPIC CHOLECYSTECTOMY WITH INTRAOPERATIVE CHOLANGIOGRAM;  Surgeon: Robert Bellow, MD;  Location: [facility];  Service: General;  Laterality: N/A;   ELBOW FRACTURE SURGERY Right    EYE SURGERY     LAPAROTOMY N/A 05/30/2019   Procedure: EXPLORATORY LAPAROTOMYand small bowel resection;  Surgeon: Jules Husbands, MD;  Location: [facility];  Service: General;  Laterality: N/A;   XI ROBOTIC  ASSISTED VENTRAL HERNIA N/A 11/06/2019   Procedure: XI ROBOTIC ASSISTED VENTRAL HERNIA REPAIR WITH MESH;  Surgeon: Jules Husbands, MD;  Location: [facility];  Service: General;  Laterality: N/A;   Patient Active Problem List   Diagnosis Date Noted   Bowel perforation ([place]) 05/30/2019   Calculus of gallbladder with acute cholecystitis without obstruction 05/15/2016    ONSET DATE: Over 6 months  REFERRING DIAG: R26.89 (ICD-10-CM) - Imbalance   THERAPY DIAG:  Difficulty in walking, not elsewhere classified  Muscle weakness (generalized)  Abnormality of gait and mobility  Other lack of coordination  Rationale for Evaluation and Treatment: Rehabilitation  SUBJECTIVE:  SUBJECTIVE STATEMENT: Patient reports she is doing well and here because her MD and daughter wanted her to come. Daughter provided most of history secondary to patient with diagnosis of mixed Alzheimers and vascular dementia. Daughter reports > 6 month history of some imbalance. She states at baseline- patient lives alone and able to perform most ADLs. Daughter reports she mostly stays with her mother throughout the day as she does not work and then leaves to go home at night and patient goes to bed early. She reports living 2 miles away and readily available. States she as noticed some imbalance and MD thought some outpatient PT may be beneficial.   Pt accompanied by: family member-Jeanette Newton  PERTINENT HISTORY: Patient is a 89 year old female with Mixed Alzheimers and vascular dementia who lives alone yet daughter stays with her during the day. Reports does not drive and not happy about that yet has been independent with all ADLs- some imbalance however daughter denies any recent falls.   PAIN:  Are you having pain? No  PRECAUTIONS:  Fall  WEIGHT BEARING RESTRICTIONS: No  FALLS: Has patient fallen in last 6 months? No  LIVING ENVIRONMENT: Lives with: lives alone and daughter is in and out and lives 2 mi away Lives in: House/apartment Stairs: Yes: Internal: 15 steps; on left going up Has following equipment at home: Single point cane, Walker - 2 wheeled, and Grab bars  PLOF: Independent with basic ADLs, Independent with gait, and Needs assistance with homemaking  PATIENT GOALS: Caregiver goal- is to improve her stability and keep her safe in her home.2  OBJECTIVE:   DIAGNOSTIC FINDINGS:   Narrative & Impression  CLINICAL DATA:  Mixed dementia   EXAM: MRI HEAD WITHOUT CONTRAST   TECHNIQUE: Multiplanar, multiecho pulse sequences of the brain and surrounding structures were obtained without intravenous contrast.   COMPARISON:  None.   FINDINGS: Brain: No acute infarct, mass effect or extra-axial collection. No acute or chronic hemorrhage. There is multifocal hyperintense T2-weighted signal within the white matter. Generalized volume loss without a clear lobar predilection. The midline structures are normal. Old left cerebellar infarct.   Vascular: Major flow voids are preserved.   Skull and upper cervical spine: Normal calvarium and skull base. Visualized upper cervical spine and soft tissues are normal.   Sinuses/Orbits:No paranasal sinus fluid levels or advanced mucosal thickening. No mastoid or middle ear effusion. Normal orbits.   IMPRESSION: 1. No acute intracranial abnormality. 2. Findings of chronic small vessel ischemia and old left cerebellar infarct. 3. Generalized volume loss in a nonspecific pattern. Quantitative, volumetric MRI of the brain may be helpful for more specific evaluation for characteristic atrophy patterns associated with dementia.     Electronically Signed   By: Ulyses Jarred M.D.   On: 11/02/2020 23:42     COGNITION: Overall cognitive status: History of  cognitive impairments - at baseline   SENSATION: WFL  COORDINATION: Slow to respond at times  EDEMA:  None observed  MUSCLE TONE: Normal throughout BUE/LE's   POSTURE: rounded shoulders and forward head  LOWER EXTREMITY ROM:     Active  Right Eval Left Eval  Hip flexion 3+ 3+  Hip extension 4 4  Hip abduction 4 4  Hip adduction 4 4  Hip internal rotation 4 4  Hip external rotation 4 4  Knee flexion 4 4  Knee extension 4 4  Ankle dorsiflexion 4 4  Ankle plantarflexion    Ankle inversion    Ankle eversion     (  Blank rows = not tested)  LOWER EXTREMITY ROM:    MMT Right Eval Left Eval  Hip flexion WNL  WNL  Hip extension WNL WNL  Hip abduction WNL WNL  Hip adduction WNL WNL  Hip internal rotation WNL WNL  Hip external rotation WNL WNL  Knee flexion WNL WNL  Knee extension WNL WNL  Ankle dorsiflexion WNL WNL  Ankle plantarflexion WNL WNL  Ankle inversion WNL WNL  Ankle eversion WNL WNL  (Blank rows = not tested)    TRANSFERS: Assistive device utilized: None  Sit to stand: Complete Independence Stand to sit: Complete Independence Chair to chair: Complete Independence Floor:  not tested   GAIT: Gait pattern: decreased step length- Right, decreased step length- Left, and shuffling Distance walked: 150 feet Assistive device utilized: None Level of assistance: SBA Comments: Mild unsteadiness with 180 deg turning and shuffling gait noted  FUNCTIONAL TESTS:  5 times sit to stand: 18.89 sec without UE Support Timed up and go (TUG): 17.23 sec without an AD Berg Balance Scale: 45/56  PATIENT SURVEYS:  FOTO 56  TODAY'S TREATMENT:                                                                                                                              DATE:   Performed PT evaluation today.     PATIENT EDUCATION: Education details: purpose of PT and how this service may assist in improving her balance Person educated: Patient, Child(ren), and  adult dtr- Jeanette Newton Education method: Explanation, Demonstration, Tactile cues, and Verbal cues Education comprehension: verbalized understanding, returned demonstration, verbal cues required, tactile cues required, and needs further education  HOME EXERCISE PROGRAM: To be initiated next 1-2 visit  GOALS: Goals reviewed with patient? Yes  SHORT TERM GOALS: Target date: 10/18/2022  Pt and caregiver (daughter) will be independent with HEP in order to improve strength and balance in order to decrease fall risk and improve function at home and work Baseline: EVAL- Patient has no formal HEP and admits to not exercising.  Goal status: INITIAL   LONG TERM GOALS: Target date: 11/29/2022  Pt will improve FOTO to target score  of > 57 to display perceived improvements in ability to complete ADL's.  Baseline: EVAL= 56  Goal status: INITIAL  2.  Pt will decrease 5TSTS by at least 3 seconds in order to demonstrate clinically significant improvement in LE strength. Baseline: EVAL=18.89 sec without UE support Goal status: INITIAL  3.  Pt will decrease TUG to below 14 seconds/decrease in order to demonstrate decreased fall risk.  Baseline: EVAL= 17.23 sec without UE support Goal status: INITIAL  4.  Pt will improve BERG by at least 3 points in order to demonstrate clinically significant improvement in balance.  Baseline: EVAL=45/56 Goal status: INITIAL  5.  Patient will deny any falls during episode of care for optimal safety with all household and community distances  Baseline: EVAL= Unsteady yet no reported falls in  last 6 months.  Goal status: INITIAL    ASSESSMENT:  CLINICAL IMPRESSION: Patient is a 89 y.o. female  who was seen today for physical therapy evaluation and treatment for diagnosis of imbalance. She presents today with some Bilateral Lower extremity muscle weakness, at risk for falling based on TUG and BERG testing. She will benefit from skilled PT services to improve her  overall strength and balance for optimal independence in the home. Barriers to progress may be but not limited to impaired cognition.    OBJECTIVE IMPAIRMENTS: Abnormal gait, decreased balance, decreased cognition, decreased coordination, decreased mobility, difficulty walking, and decreased strength.   ACTIVITY LIMITATIONS: carrying, lifting, and stairs  PARTICIPATION LIMITATIONS: cleaning, laundry, shopping, community activity, and yard work  PERSONAL FACTORS: 1 comorbidity: dementia  are also affecting patient's functional outcome.   REHAB POTENTIAL: Good  CLINICAL DECISION MAKING: Evolving/moderate complexity  EVALUATION COMPLEXITY: Low  PLAN:  PT FREQUENCY: 1-2x/week  PT DURATION: 12 weeks  PLANNED INTERVENTIONS: Therapeutic exercises, Therapeutic activity, Neuromuscular re-education, Balance training, Gait training, Patient/Family education, Self Care, Joint mobilization, Stair training, Vestibular training, Canalith repositioning, DME instructions, Cryotherapy, Moist heat, and Manual therapy  PLAN FOR NEXT SESSION: Implement balance training and LE strengthening and initiate exercises for HEP for good carry over.    Lewis Moccasin, PT 09/07/2022, 7:42 AM

## 2022-09-11 ENCOUNTER — Ambulatory Visit: Payer: Medicare Other

## 2022-09-11 ENCOUNTER — Ambulatory Visit: Payer: Medicare Other | Admitting: Speech Pathology

## 2022-09-11 DIAGNOSIS — R41841 Cognitive communication deficit: Secondary | ICD-10-CM

## 2022-09-11 DIAGNOSIS — M6281 Muscle weakness (generalized): Secondary | ICD-10-CM

## 2022-09-11 DIAGNOSIS — R278 Other lack of coordination: Secondary | ICD-10-CM

## 2022-09-11 DIAGNOSIS — R262 Difficulty in walking, not elsewhere classified: Secondary | ICD-10-CM

## 2022-09-11 DIAGNOSIS — R269 Unspecified abnormalities of gait and mobility: Secondary | ICD-10-CM

## 2022-09-11 NOTE — Therapy (Signed)
OUTPATIENT PHYSICAL THERAPY NEURO TREATMENT   Patient Name: Jeanette Newton MRN: [id number] DOB:26-Dec-1927, 89 y.o., female Today's Date: 09/11/2022   PCP: Dr. Emily Filbert REFERRING PROVIDER: Dr. Jennings Books  END OF SESSION:  PT End of Session - 09/11/22 0903     Visit Number 2    Number of Visits 24    Date for PT Re-Evaluation 11/29/22    Progress Note Due on Visit 10    PT Start Time 0915    PT Stop Time 0948    PT Time Calculation (min) 33 min    Equipment Utilized During Treatment Gait belt    Activity Tolerance Patient tolerated treatment well    Behavior During Therapy [facility] for tasks assessed/performed             Past Medical History:  Diagnosis Date   Cancer ([place]) 2005-2006   lymphoma   Hypothyroidism    Status post chemoradiation    lymphoma   Status post radiation therapy    Thyroid disease    Wears hearing aid in both ears    Past Surgical History:  Procedure Laterality Date   ABDOMINAL EXPLORATION SURGERY  2005-2006   Dr Pat Patrick   CATARACT EXTRACTION [facility] Left 07/28/2019   Procedure: CATARACT EXTRACTION PHACO AND INTRAOCULAR LENS PLACEMENT ([place]) LEFT 7.83  00:53.4;  Surgeon: Birder Robson, MD;  Location: [place];  Service: Ophthalmology;  Laterality: Left;   CATARACT EXTRACTION W/PHACO Right 08/25/2019   Procedure: CATARACT EXTRACTION PHACO AND INTRAOCULAR LENS PLACEMENT ([facility]) RIGHT 6.04 00:41.0;  Surgeon: Birder Robson, MD;  Location: [place];  Service: Ophthalmology;  Laterality: Right;   CHOLECYSTECTOMY N/A 05/25/2016   Procedure: LAPAROSCOPIC CHOLECYSTECTOMY WITH INTRAOPERATIVE CHOLANGIOGRAM;  Surgeon: Robert Bellow, MD;  Location: [facility];  Service: General;  Laterality: N/A;   ELBOW FRACTURE SURGERY Right    EYE SURGERY     LAPAROTOMY N/A 05/30/2019   Procedure: EXPLORATORY LAPAROTOMYand small bowel resection;  Surgeon: Jules Husbands, MD;  Location: [facility];  Service: General;  Laterality: N/A;   XI ROBOTIC  ASSISTED VENTRAL HERNIA N/A 11/06/2019   Procedure: XI ROBOTIC ASSISTED VENTRAL HERNIA REPAIR WITH MESH;  Surgeon: Jules Husbands, MD;  Location: [facility];  Service: General;  Laterality: N/A;   Patient Active Problem List   Diagnosis Date Noted   Bowel perforation ([place]) 05/30/2019   Calculus of gallbladder with acute cholecystitis without obstruction 05/15/2016    ONSET DATE: Over 6 months  REFERRING DIAG: R26.89 (ICD-10-CM) - Imbalance   THERAPY DIAG:  Difficulty in walking, not elsewhere classified  Muscle weakness (generalized)  Abnormality of gait and mobility  Other lack of coordination  Rationale for Evaluation and Treatment: Rehabilitation  SUBJECTIVE:  SUBJECTIVE STATEMENT: Patient reports doing okay- Did report fall in den while negotiating something with the TV.   Pt accompanied by: family member-[name]  PERTINENT HISTORY: Patient is a 89 year old female with Mixed Alzheimers and vascular dementia who lives alone yet daughter stays with her during the day. Reports does not drive and not happy about that yet has been independent with all ADLs- some imbalance however daughter denies any recent falls.   PAIN:  Are you having pain? No  PRECAUTIONS: Fall  WEIGHT BEARING RESTRICTIONS: No  FALLS: Has patient fallen in last 6 months? No  LIVING ENVIRONMENT: Lives with: lives alone and daughter is in and out and lives 2 mi away Lives in: House/apartment Stairs: Yes: Internal: 15 steps; on left going up Has following equipment at home: Single point cane, Walker - 2 wheeled, and Grab bars  PLOF: Independent with basic ADLs, Independent with gait, and Needs assistance with homemaking  PATIENT GOALS: Caregiver goal- is to improve her stability and keep her safe in her  home.2  OBJECTIVE:   DIAGNOSTIC FINDINGS:   Narrative & Impression  CLINICAL DATA:  Mixed dementia   EXAM: MRI HEAD WITHOUT CONTRAST   TECHNIQUE: Multiplanar, multiecho pulse sequences of the brain and surrounding structures were obtained without intravenous contrast.   COMPARISON:  None.   FINDINGS: Brain: No acute infarct, mass effect or extra-axial collection. No acute or chronic hemorrhage. There is multifocal hyperintense T2-weighted signal within the white matter. Generalized volume loss without a clear lobar predilection. The midline structures are normal. Old left cerebellar infarct.   Vascular: Major flow voids are preserved.   Skull and upper cervical spine: Normal calvarium and skull base. Visualized upper cervical spine and soft tissues are normal.   Sinuses/Orbits:No paranasal sinus fluid levels or advanced mucosal thickening. No mastoid or middle ear effusion. Normal orbits.   IMPRESSION: 1. No acute intracranial abnormality. 2. Findings of chronic small vessel ischemia and old left cerebellar infarct. 3. Generalized volume loss in a nonspecific pattern. Quantitative, volumetric MRI of the brain may be helpful for more specific evaluation for characteristic atrophy patterns associated with dementia.     Electronically Signed   By: Ulyses Jarred M.D.   On: 11/02/2020 23:42     COGNITION: Overall cognitive status: History of cognitive impairments - at baseline   SENSATION: WFL  COORDINATION: Slow to respond at times  EDEMA:  None observed  MUSCLE TONE: Normal throughout BUE/LE's   POSTURE: rounded shoulders and forward head  LOWER EXTREMITY ROM:     Active  Right Eval Left Eval  Hip flexion 3+ 3+  Hip extension 4 4  Hip abduction 4 4  Hip adduction 4 4  Hip internal rotation 4 4  Hip external rotation 4 4  Knee flexion 4 4  Knee extension 4 4  Ankle dorsiflexion 4 4  Ankle plantarflexion    Ankle inversion    Ankle eversion      (Blank rows = not tested)  LOWER EXTREMITY ROM  MMT Right Eval Left Eval  Hip flexion WNL  WNL  Hip extension WNL WNL  Hip abduction WNL WNL  Hip adduction WNL WNL  Hip internal rotation WNL WNL  Hip external rotation WNL WNL  Knee flexion WNL WNL  Knee extension WNL WNL  Ankle dorsiflexion WNL WNL  Ankle plantarflexion WNL WNL  Ankle inversion WNL WNL  Ankle eversion WNL WNL  (Blank rows = not tested)    TRANSFERS: Assistive device utilized: None  Sit to stand: Complete Independence Stand to sit: Complete Independence Chair to chair: Complete Independence Floor:  not tested   GAIT: Gait pattern: decreased step length- Right, decreased step length- Left, and shuffling Distance walked: 150 feet Assistive device utilized: None Level of assistance: SBA Comments: Mild unsteadiness with 180 deg turning and shuffling gait noted  FUNCTIONAL TESTS:  5 times sit to stand: 18.89 sec without UE Support Timed up and go (TUG): 17.23 sec without an AD Berg Balance Scale: 45/56  PATIENT SURVEYS:  FOTO 56  TODAY'S TREATMENT:                                                                                                                              DATE:  09/11/2022  Therex:   Seated LE strengthening-  -Hip march BLE x 15 reps with 3lb AW Knee ext BLE x 10 reps each with 3lb AW Hamstring curl- BTB x 12 reps BLE 3lb AW Heel raises 3lb AW x 12 reps BLE Toe Raises 3lb AW x 12 reps BLE Hip add with ball squeeze- hold 3 sec and 10 reps  PATIENT EDUCATION: Education details: purpose of PT and how this service may assist in improving her balance Person educated: Patient, Child(ren), and [name] Education method: Explanation, Demonstration, Tactile cues, and Verbal cues Education comprehension: verbalized understanding, returned demonstration, verbal cues required, tactile cues required, and needs further education  HOME EXERCISE PROGRAM: Access Code:  [email or web address] URL: [email or web address] Date: 09/11/2022 Prepared by: Sande Brothers  Exercises - Seated Hip Flexion March with Ankle Weights  - 1 x daily - 3 x weekly - 3 sets - 10 reps - Seated Hip Abduction with Resistance  - 1 x daily - 3 x weekly - 3 sets - 10 reps - Seated Hip Adduction Isometrics with Ball  - 1 x daily - 3 x weekly - 3 sets - 10 reps - Seated Long Arc Quad with Ankle Weight  - 1 x daily - 3 x weekly - 3 sets - 10 reps - Seated Hamstring Curl with Anchored Resistance  - 1 x daily - 3 x weekly - 3 sets - 10 reps - Seated Heel Raise  - 1 x daily - 3 x weekly - 3 sets - 10 reps - Seated Heel Toe Raises  - 1 x daily - 3 x weekly - 3 sets - 10 reps - Sit to Stand with Arms Crossed  - 1 x daily - 3 x weekly - 3 sets - 10 reps  TGOALS: Goals reviewed with patient? Yes  SHORT TERM GOALS: Target date: 10/18/2022  Pt and caregiver (daughter) will be independent with HEP in order to improve strength and balance in order to decrease fall risk and improve function at home and work Baseline: EVAL- Patient has no formal HEP and admits to not exercising.  Goal status: INITIAL   LONG TERM GOALS: Target date: 11/29/2022  Pt will improve FOTO to  target score  of > 57 to display perceived improvements in ability to complete ADL's.  Baseline: EVAL= 56  Goal status: INITIAL  2.  Pt will decrease 5TSTS by at least 3 seconds in order to demonstrate clinically significant improvement in LE strength. Baseline: EVAL=18.89 sec without UE support Goal status: INITIAL  3.  Pt will decrease TUG to below 14 seconds/decrease in order to demonstrate decreased fall risk.  Baseline: EVAL= 17.23 sec without UE support Goal status: INITIAL  4.  Pt will improve BERG by at least 3 points in order to demonstrate clinically significant improvement in balance.  Baseline: EVAL=45/56 Goal status: INITIAL  5.  Patient will deny any falls during episode of care for optimal safety with  all household and community distances  Baseline: EVAL= Unsteady yet no reported falls in last 6 months.  Goal status: INITIAL    ASSESSMENT:  CLINICAL IMPRESSION:  Patient responded well today to introduction to LE strengthening. She was able to accept some ankle weighted resistance well without complaints. She was also able to follow all verbal cues and return demonstration with all therex well without significant difficulty. She will continue to benefit from skilled PT services to improve her overall strength and balance for optimal independence in the home. Barriers to progress may be but not limited to impaired cognition.    OBJECTIVE IMPAIRMENTS: Abnormal gait, decreased balance, decreased cognition, decreased coordination, decreased mobility, difficulty walking, and decreased strength.   ACTIVITY LIMITATIONS: carrying, lifting, and stairs  PARTICIPATION LIMITATIONS: cleaning, laundry, shopping, community activity, and yard work  PERSONAL FACTORS: 1 comorbidity: dementia  are also affecting patient's functional outcome.   REHAB POTENTIAL: Good  CLINICAL DECISION MAKING: Evolving/moderate complexity  EVALUATION COMPLEXITY: Low  PLAN:  PT FREQUENCY: 1-2x/week  PT DURATION: 12 weeks  PLANNED INTERVENTIONS: Therapeutic exercises, Therapeutic activity, Neuromuscular re-education, Balance training, Gait training, Patient/Family education, Self Care, Joint mobilization, Stair training, Vestibular training, Canalith repositioning, DME instructions, Cryotherapy, Moist heat, and Manual therapy  PLAN FOR NEXT SESSION: Continue with LE strengthening and balance training as appropriate.     Lewis Moccasin, PT 09/11/2022, 8:28 PM

## 2022-09-11 NOTE — Therapy (Signed)
OUTPATIENT SPEECH LANGUAGE PATHOLOGY TREATMENT NOTE   Patient Name: Jeanette Newton MRN: [id number] DOB:August 13, 1928, 89 y.o., female Today's Date: 09/11/2022  PCP: Emily Filbert, MD REFERRING PROVIDER: Jennings Books, MD  END OF SESSION:   End of Session - 09/11/22 2133     Visit Number 2    Number of Visits 17    Date for SLP Re-Evaluation 11/01/22    Authorization Type [employer]    Progress Note Due on Visit 10    SLP Start Time 1000    SLP Stop Time  1100    SLP Time Calculation (min) 60 min    Activity Tolerance Patient tolerated treatment well             Past Medical History:  Diagnosis Date   Cancer ([place]) 2005-2006   lymphoma   Hypothyroidism    Status post chemoradiation    lymphoma   Status post radiation therapy    Thyroid disease    Wears hearing aid in both ears    Past Surgical History:  Procedure Laterality Date   ABDOMINAL EXPLORATION SURGERY  2005-2006   Dr Pat Patrick   CATARACT EXTRACTION [facility] Left 07/28/2019   Procedure: CATARACT EXTRACTION PHACO AND INTRAOCULAR LENS PLACEMENT ([place]) LEFT 7.83  00:53.4;  Surgeon: Birder Robson, MD;  Location: [place];  Service: Ophthalmology;  Laterality: Left;   CATARACT EXTRACTION W/PHACO Right 08/25/2019   Procedure: CATARACT EXTRACTION PHACO AND INTRAOCULAR LENS PLACEMENT ([facility]) RIGHT 6.04 00:41.0;  Surgeon: Birder Robson, MD;  Location: [place];  Service: Ophthalmology;  Laterality: Right;   CHOLECYSTECTOMY N/A 05/25/2016   Procedure: LAPAROSCOPIC CHOLECYSTECTOMY WITH INTRAOPERATIVE CHOLANGIOGRAM;  Surgeon: Robert Bellow, MD;  Location: [facility];  Service: General;  Laterality: N/A;   ELBOW FRACTURE SURGERY Right    EYE SURGERY     LAPAROTOMY N/A 05/30/2019   Procedure: EXPLORATORY LAPAROTOMYand small bowel resection;  Surgeon: Jules Husbands, MD;  Location: [facility];  Service: General;  Laterality: N/A;   XI ROBOTIC ASSISTED VENTRAL HERNIA N/A 11/06/2019   Procedure:  XI ROBOTIC ASSISTED VENTRAL HERNIA REPAIR WITH MESH;  Surgeon: Jules Husbands, MD;  Location: [facility];  Service: General;  Laterality: N/A;   Patient Active Problem List   Diagnosis Date Noted   Bowel perforation ([place]) 05/30/2019   Calculus of gallbladder with acute cholecystitis without obstruction 05/15/2016    ONSET DATE: 09/2020;  05/10/2022 date of referral   REFERRING DIAG:  G30.9 (ICD-10-CM) - Alzheimer's disease, unspecified  F01.50 (ICD-10-CM) - Vascular dementia ([place])      PERTINENT HISTORY: Pt is a 89 year old female with diagnosis of Mixed Dementia (Alzheimer's + Vascular) with mild age-related atrophy and chronic microvascular ischemic changes as well as small stroke in the left cerebellum.    THERAPY DIAG:  Cognitive communication deficit  Rationale for Evaluation and Treatment Rehabilitation  SUBJECTIVE: pt pleasant, HOH, poor historian  Pt accompanied by: family member  PAIN:  Are you having pain? No  PATIENT GOALS: to continue living safely in her home  OBJECTIVE:   TODAY'S TREATMENT: Skilled treatment session focused on pt's cognitive communication goals, specifically goals surrounding safety within her home. SLP facilitated session by providing the following interventions:  Provided skilled information on [facility] and resources within community for louder fire alarms.   Education provided on pt's reduced ability for new learning therefore recommend pt's daughter check into possible life alert with falls notification.   Skilled information with list made of important papers  and information to have complied together for easy access to emergency services (information including current medication lists, DNR, medical problems,        PATIENT EDUCATION: Education details: see above Person educated: Patient and Child(ren) Education method: Explanation, Demonstration, Verbal cues, and Handouts Education comprehension: verbalized  understanding    GOALS: Goals reviewed with patient? Yes   SHORT TERM GOALS: Target date: 10 sessions   Pt and caregiver will increase understanding of medical condition by answering basic [place] questions with 80% accuracy. Baseline: New Goal Goal status: INITIAL   2.  Pt will use external memory aids to help with orientation and recall of when appts are with 50% ability.  Baseline:  Goal status: INITIAL   LONG TERM GOALS: Target date: 11/01/2022 Pt and caregiver will utilize cognitive compensatory strategies to increase pt's safety within pt's home environment.  Baseline:  Goal status: INITIAL    ASSESSMENT:  CLINICAL IMPRESSION: Pt and her daughter are eager to implement all recommendations.    OBJECTIVE IMPAIRMENTS include attention, memory, awareness, and executive functioning. These impairments are limiting patient from household responsibilities and effectively communicating at home and in community. Factors affecting potential to achieve goals and functional outcome are ability to learn/carryover information, co-morbidities, medical prognosis, previous level of function, and severity of impairments. Patient will benefit from skilled SLP services to address above impairments and improve overall function.  REHAB POTENTIAL: Good  PLAN: SLP FREQUENCY: 1-2x/week  SLP DURATION: 8 weeks  PLANNED INTERVENTIONS: Internal/external aids, Functional tasks, SLP instruction and feedback, and Patient/family education

## 2022-09-11 NOTE — Therapy (Signed)
OUTPATIENT SPEECH LANGUAGE PATHOLOGY  COGNITION EVALUATION   Patient Name: Jeanette Newton MRN: [id number] DOB:[birth year], 89 y.o., female Today's Date: 09/06/2022  PCP: Emily Filbert, MD REFERRING PROVIDER: Jennings Books, MD   End of Session - 09/06/22 2131     Visit Number 1    Number of Visits 17    Date for SLP Re-Evaluation 11/01/22    Authorization Type [employer]    Progress Note Due on Visit 10    SLP Start Time 1100    SLP Stop Time  1200    SLP Time Calculation (min) 60 min             Past Medical History:  Diagnosis Date   Cancer ([place]) 2005-2006   lymphoma   Hypothyroidism    Status post chemoradiation    lymphoma   Status post radiation therapy    Thyroid disease    Wears hearing aid in both ears    Past Surgical History:  Procedure Laterality Date   ABDOMINAL EXPLORATION SURGERY  2005-2006   Dr Pat Patrick   CATARACT EXTRACTION [facility] Left 07/28/2019   Procedure: CATARACT EXTRACTION PHACO AND INTRAOCULAR LENS PLACEMENT ([place]) LEFT 7.83  00:53.4;  Surgeon: Birder Robson, MD;  Location: [place];  Service: Ophthalmology;  Laterality: Left;   CATARACT EXTRACTION W/PHACO Right 08/25/2019   Procedure: CATARACT EXTRACTION PHACO AND INTRAOCULAR LENS PLACEMENT ([facility]) RIGHT 6.04 00:41.0;  Surgeon: Birder Robson, MD;  Location: [place];  Service: Ophthalmology;  Laterality: Right;   CHOLECYSTECTOMY N/A 05/25/2016   Procedure: LAPAROSCOPIC CHOLECYSTECTOMY WITH INTRAOPERATIVE CHOLANGIOGRAM;  Surgeon: Robert Bellow, MD;  Location: [facility];  Service: General;  Laterality: N/A;   ELBOW FRACTURE SURGERY Right    EYE SURGERY     LAPAROTOMY N/A 05/30/2019   Procedure: EXPLORATORY LAPAROTOMYand small bowel resection;  Surgeon: Jules Husbands, MD;  Location: [facility];  Service: General;  Laterality: N/A;   XI ROBOTIC ASSISTED VENTRAL HERNIA N/A 11/06/2019   Procedure: XI ROBOTIC ASSISTED VENTRAL HERNIA REPAIR WITH MESH;  Surgeon:  Jules Husbands, MD;  Location: [facility];  Service: General;  Laterality: N/A;   Patient Active Problem List   Diagnosis Date Noted   Bowel perforation ([place]) 05/30/2019   Calculus of gallbladder with acute cholecystitis without obstruction 05/15/2016    ONSET DATE: 09/2020;  05/10/2022 date if referral  REFERRING DIAG:  G30.9 (ICD-10-CM) - Alzheimer's disease, unspecified  F01.50 (ICD-10-CM) - Vascular dementia ([place])    THERAPY DIAG:  Cognitive communication deficit  Rationale for Evaluation and Treatment Rehabilitation  SUBJECTIVE:   SUBJECTIVE STATEMENT: Pt pleasant, HOH, poor historian  Pt accompanied by: family member  PERTINENT HISTORY: Pt is a 89 year old female with diagnosis of Mixed Dementia (Alzheimer's + Vascular) with mild age-related atrophy and chronic microvascular ischemic changes as well as small stroke in the left cerebellum.     PAIN:  Are you having pain? No   FALLS: Has patient fallen in last 6 months?  No  LIVING ENVIRONMENT: Lives with: lives alone Lives in: House/apartment  PLOF:  Level of assistance: Needed assistance with ADLs, Needed assistance with IADLS Employment: Retired   PATIENT GOALS To continue living safely in her house  OBJECTIVE:   COGNITIVE COMMUNICATION Overall cognitive status: Impaired Areas of impairment:  Attention: Impaired: Selective Memory: Impaired: Immediate Working Short term Programmer, systems Awareness: Impaired: Anticipatory Executive function: Impaired: Initiation, Problem solving, Planning, Error awareness, and Slow processing Behavior: Lability Auditory comprehension: WFL Verbal expression: [facility]  Functional communication: WFL Functional deficits: pt's daughter manages medicine, money, doctor's appts  ORAL MOTOR EXAMINATION Facial : WFL Lingual: WFL Velum: WFL Mandible: WFL Cough: WFL Voice: WFL   STANDARDIZED ASSESSMENTS: The "[place] Mental Status" (SLUMS) Examination was  administered. Pt scored **/30, raising concern for the presence of a neurocognitive disorder. Further testing would be beneficial, as deficits of attention, memory, oriantion, problem solving, and executive functions identified today may negatively impact pt safety with independent living.   SLUMS Examination Orientation  0/3  Numeric Problem Solving  1/3  Memory  1/5  Attention 1/2  Thought Organization 0/3  Clock Drawing 0/4  Visuospatial Skills               2/2  Short Story Recall  0/8  Total  5/30     Scoring  High School Education  Less than High School Education   Normal  27-30 25-30  Mild Neurocognitive Disorder 21-26 20-24  Dementia  1-20 1-19     PATIENT REPORTED OUTCOME MEASURES (PROM):  Pt unable to complete d/t cognitive deficits  TODAY'S TREATMENT:  Skilled education provided on dementia, specific types of dementia. Regarding pt's decreased PO intake, pt states "I am not hungry." She nor her daughter report any overt s/s of aspiration. Suspect that pt's lack of hunger is related to pt's cognitive deficits rather than dysphagia. Notes from GI were reviewed. Currently there isn't anything from a GI point of view to explain structural cause.   PATIENT EDUCATION: Education details: see above Person educated: Patient and Child(ren) Education method: Explanation, Demonstration, Verbal cues, and Handouts Education comprehension: verbalized understanding and needs further education     GOALS: Goals reviewed with patient? Yes  SHORT TERM GOALS: Target date: 10 sessions  Pt and caregiver will increase understanding of medical condition by answering basic [place] questions with 80% accuracy. Baseline: New Goal Goal status: INITIAL  2.  Pt will use external memory aids to help with orientation and recall of when appts are with 50% ability.  Baseline:  Goal status: INITIAL  LONG TERM GOALS: Target date: 11/01/2022 Pt and caregiver will utilize cognitive compensatory  strategies to increase pt's safety within pt's home environment.  Baseline:  Goal status: INITIAL    ASSESSMENT:  CLINICAL IMPRESSION: Patient is a 89 y.o. female who was seen today for cognitive communication assessment in the setting of weight loss and decreased hunger.  Pt presents with severe to profound cognitive deficits that are likely impacting her ability to consume POs.   OBJECTIVE IMPAIRMENTS include attention, memory, awareness, and executive functioning. These impairments are limiting patient from safety when swallowing. Factors affecting potential to achieve goals and functional outcome are ability to learn/carryover information, co-morbidities, medical prognosis, previous level of function, and severity of impairments.. Patient will benefit from skilled SLP services to address above impairments and improve overall function.  REHAB POTENTIAL: Fair severe cognitive deficits  PLAN: SLP FREQUENCY: 1-2x/week  SLP DURATION: 8 weeks  PLANNED INTERVENTIONS: Environmental controls, Cueing hierachy, Internal/external aids, Functional tasks, Compensatory strategies, and Patient/family education   Jarrel Knoke B. Rutherford Nail, M.S., CCC-SLP, Mining engineer Certified Brain Injury [place]  [place] Office [phone] Ascom [phone] Fax [phone]

## 2022-09-13 ENCOUNTER — Ambulatory Visit: Payer: Medicare Other | Admitting: Speech Pathology

## 2022-09-13 ENCOUNTER — Ambulatory Visit: Payer: Medicare Other

## 2022-09-13 DIAGNOSIS — R262 Difficulty in walking, not elsewhere classified: Secondary | ICD-10-CM | POA: Diagnosis not present

## 2022-09-13 DIAGNOSIS — M6281 Muscle weakness (generalized): Secondary | ICD-10-CM

## 2022-09-13 DIAGNOSIS — R269 Unspecified abnormalities of gait and mobility: Secondary | ICD-10-CM

## 2022-09-13 NOTE — Therapy (Signed)
OUTPATIENT PHYSICAL THERAPY NEURO TREATMENT   Patient Name: Jeanette Newton MRN: [id number] DOB:04-Feb-1928, 89 y.o., female Today's Date: 09/13/2022   PCP: Dr. Emily Filbert REFERRING PROVIDER: Dr. Jennings Books  END OF SESSION:  PT End of Session - 09/13/22 0913     Visit Number 3    Number of Visits 24    Date for PT Re-Evaluation 11/29/22    Progress Note Due on Visit 10    PT Start Time 0913    PT Stop Time 0957    PT Time Calculation (min) 44 min    Equipment Utilized During Treatment Gait belt    Activity Tolerance Patient tolerated treatment well    Behavior During Therapy [facility] for tasks assessed/performed             Past Medical History:  Diagnosis Date   Cancer ([place]) 2005-2006   lymphoma   Hypothyroidism    Status post chemoradiation    lymphoma   Status post radiation therapy    Thyroid disease    Wears hearing aid in both ears    Past Surgical History:  Procedure Laterality Date   ABDOMINAL EXPLORATION SURGERY  2005-2006   Dr Pat Patrick   CATARACT EXTRACTION [facility] Left 07/28/2019   Procedure: CATARACT EXTRACTION PHACO AND INTRAOCULAR LENS PLACEMENT ([place]) LEFT 7.83  00:53.4;  Surgeon: Birder Robson, MD;  Location: [place];  Service: Ophthalmology;  Laterality: Left;   CATARACT EXTRACTION W/PHACO Right 08/25/2019   Procedure: CATARACT EXTRACTION PHACO AND INTRAOCULAR LENS PLACEMENT ([facility]) RIGHT 6.04 00:41.0;  Surgeon: Birder Robson, MD;  Location: [place];  Service: Ophthalmology;  Laterality: Right;   CHOLECYSTECTOMY N/A 05/25/2016   Procedure: LAPAROSCOPIC CHOLECYSTECTOMY WITH INTRAOPERATIVE CHOLANGIOGRAM;  Surgeon: Robert Bellow, MD;  Location: [facility];  Service: General;  Laterality: N/A;   ELBOW FRACTURE SURGERY Right    EYE SURGERY     LAPAROTOMY N/A 05/30/2019   Procedure: EXPLORATORY LAPAROTOMYand small bowel resection;  Surgeon: Jules Husbands, MD;  Location: [facility];  Service: General;  Laterality: N/A;   XI ROBOTIC  ASSISTED VENTRAL HERNIA N/A 11/06/2019   Procedure: XI ROBOTIC ASSISTED VENTRAL HERNIA REPAIR WITH MESH;  Surgeon: Jules Husbands, MD;  Location: [facility];  Service: General;  Laterality: N/A;   Patient Active Problem List   Diagnosis Date Noted   Bowel perforation ([place]) 05/30/2019   Calculus of gallbladder with acute cholecystitis without obstruction 05/15/2016    ONSET DATE: Over 6 months  REFERRING DIAG: R26.89 (ICD-10-CM) - Imbalance   THERAPY DIAG:  Difficulty in walking, not elsewhere classified  Muscle weakness (generalized)  Abnormality of gait and mobility  Rationale for Evaluation and Treatment: Rehabilitation  SUBJECTIVE:  SUBJECTIVE STATEMENT: Pt reports no falls since last visit. Has not trialed her HEP as she reports it is 3x/week and was here earlier this week. Denies soreness from last session but reports tiredness from activity. Daughter present throughout session.  Pt accompanied by: family member-[name]  PERTINENT HISTORY: Patient is a 89 year old female with Mixed Alzheimers and vascular dementia who lives alone yet daughter stays with her during the day. Reports does not drive and not happy about that yet has been independent with all ADLs- some imbalance however daughter denies any recent falls.   PAIN:  Are you having pain? No  PRECAUTIONS: Fall  WEIGHT BEARING RESTRICTIONS: No  FALLS: Has patient fallen in last 6 months? No  LIVING ENVIRONMENT: Lives with: lives alone and daughter is in and out and lives 2 mi away Lives in: House/apartment Stairs: Yes: Internal: 15 steps; on left going up Has following equipment at home: Single point cane, Walker - 2 wheeled, and Grab bars  PLOF: Independent with basic ADLs, Independent with gait, and Needs assistance with  homemaking  PATIENT GOALS: Caregiver goal- is to improve her stability and keep her safe in her home.2  OBJECTIVE:   DIAGNOSTIC FINDINGS:   Narrative & Impression  CLINICAL DATA:  Mixed dementia   EXAM: MRI HEAD WITHOUT CONTRAST   TECHNIQUE: Multiplanar, multiecho pulse sequences of the brain and surrounding structures were obtained without intravenous contrast.   COMPARISON:  None.   FINDINGS: Brain: No acute infarct, mass effect or extra-axial collection. No acute or chronic hemorrhage. There is multifocal hyperintense T2-weighted signal within the white matter. Generalized volume loss without a clear lobar predilection. The midline structures are normal. Old left cerebellar infarct.   Vascular: Major flow voids are preserved.   Skull and upper cervical spine: Normal calvarium and skull base. Visualized upper cervical spine and soft tissues are normal.   Sinuses/Orbits:No paranasal sinus fluid levels or advanced mucosal thickening. No mastoid or middle ear effusion. Normal orbits.   IMPRESSION: 1. No acute intracranial abnormality. 2. Findings of chronic small vessel ischemia and old left cerebellar infarct. 3. Generalized volume loss in a nonspecific pattern. Quantitative, volumetric MRI of the brain may be helpful for more specific evaluation for characteristic atrophy patterns associated with dementia.     Electronically Signed   By: Ulyses Jarred M.D.   On: 11/02/2020 23:42     COGNITION: Overall cognitive status: History of cognitive impairments - at baseline   SENSATION: WFL  COORDINATION: Slow to respond at times  EDEMA:  None observed  MUSCLE TONE: Normal throughout BUE/LE's   POSTURE: rounded shoulders and forward head  LOWER EXTREMITY ROM:     Active  Right Eval Left Eval  Hip flexion 3+ 3+  Hip extension 4 4  Hip abduction 4 4  Hip adduction 4 4  Hip internal rotation 4 4  Hip external rotation 4 4  Knee flexion 4 4  Knee  extension 4 4  Ankle dorsiflexion 4 4  Ankle plantarflexion    Ankle inversion    Ankle eversion     (Blank rows = not tested)  LOWER EXTREMITY ROM  MMT Right Eval Left Eval  Hip flexion WNL  WNL  Hip extension WNL WNL  Hip abduction WNL WNL  Hip adduction WNL WNL  Hip internal rotation WNL WNL  Hip external rotation WNL WNL  Knee flexion WNL WNL  Knee extension WNL WNL  Ankle dorsiflexion WNL WNL  Ankle plantarflexion WNL WNL  Ankle  inversion WNL WNL  Ankle eversion WNL WNL  (Blank rows = not tested)    TRANSFERS: Assistive device utilized: None  Sit to stand: Complete Independence Stand to sit: Complete Independence Chair to chair: Complete Independence Floor:  not tested   GAIT: Gait pattern: decreased step length- Right, decreased step length- Left, and shuffling Distance walked: 150 feet Assistive device utilized: None Level of assistance: SBA Comments: Mild unsteadiness with 180 deg turning and shuffling gait noted  FUNCTIONAL TESTS:  5 times sit to stand: 18.89 sec without UE Support Timed up and go (TUG): 17.23 sec without an AD Berg Balance Scale: 45/56  PATIENT SURVEYS:  FOTO 56  TODAY'S TREATMENT:                                                                                                                              DATE:  09/13/2022  Therex:  Reviewed HEP:  Performed 1x10/exercise as below with 5# AW's for time management and to engage in standing balance therapy. Min to mod VC's and intermittent max TC's for form/technique. Hip abduction and hamstring curls performed with blue TB.   3x10 STS with arms across chest   Neuro Re-Ed: CGA throughout session  Figure 8 turns with cones placed roughly 10' apart.    Cone weaves 8 cones. Min to mod VC's for scanning environment and not staring at her feet on the floor    1/2 bolster and 6" hurdle step overs working on consistent step through lengths. CGA, x6 total 3 lengths per leg. Min to mod  VC's for not staring at the floor.    Length of the hallway:    Horizontal head turns with dual task naming sticky notes. Mod to max VC's for sequencing head turns. Difficulty coordinating head turns and forward gait with variable step lengths and R/L drifting. X4 laps    Vertical head turns: x2 laps with mod to max VC's for sequencing head turns.       Access Code: [email or web address] URL: [email or web address] Date: 09/11/2022 Prepared by: Sande Brothers   Exercises - Seated Hip Flexion March with Ankle Weights  - 1 x daily - 3 x weekly - 3 sets - 10 reps - Seated Hip Abduction with Resistance  - 1 x daily - 3 x weekly - 3 sets - 10 reps - Seated Hip Adduction Isometrics with Ball  - 1 x daily - 3 x weekly - 3 sets - 10 reps - Seated Long Arc Quad with Ankle Weight  - 1 x daily - 3 x weekly - 3 sets - 10 reps - Seated Hamstring Curl with Anchored Resistance  - 1 x daily - 3 x weekly - 3 sets - 10 reps - Seated Heel Raise  - 1 x daily - 3 x weekly - 3 sets - 10 reps - Seated Heel Toe Raises  - 1 x daily - 3 x weekly - 3 sets - 10 reps -  Sit to Stand with Arms Crossed  - 1 x daily - 3 x weekly - 3 sets - 10 reps  PATIENT EDUCATION: Education details: purpose of PT and how this service may assist in improving her balance Person educated: Patient, Child(ren), and [name] Education method: Explanation, Demonstration, Tactile cues, and Verbal cues Education comprehension: verbalized understanding, returned demonstration, verbal cues required, tactile cues required, and needs further education  HOME EXERCISE PROGRAM: Access Code: [email or web address] URL: [email or web address] Date: 09/11/2022 Prepared by: Sande Brothers  Exercises - Seated Hip Flexion March with Ankle Weights  - 1 x daily - 3 x weekly - 3 sets - 10 reps - Seated Hip Abduction with Resistance  - 1 x daily - 3 x weekly - 3 sets - 10 reps - Seated Hip Adduction Isometrics with Ball  - 1 x daily  - 3 x weekly - 3 sets - 10 reps - Seated Long Arc Quad with Ankle Weight  - 1 x daily - 3 x weekly - 3 sets - 10 reps - Seated Hamstring Curl with Anchored Resistance  - 1 x daily - 3 x weekly - 3 sets - 10 reps - Seated Heel Raise  - 1 x daily - 3 x weekly - 3 sets - 10 reps - Seated Heel Toe Raises  - 1 x daily - 3 x weekly - 3 sets - 10 reps - Sit to Stand with Arms Crossed  - 1 x daily - 3 x weekly - 3 sets - 10 reps  TGOALS: Goals reviewed with patient? Yes  SHORT TERM GOALS: Target date: 10/18/2022  Pt and caregiver (daughter) will be independent with HEP in order to improve strength and balance in order to decrease fall risk and improve function at home and work Baseline: EVAL- Patient has no formal HEP and admits to not exercising.  Goal status: INITIAL   LONG TERM GOALS: Target date: 11/29/2022  Pt will improve FOTO to target score  of > 57 to display perceived improvements in ability to complete ADL's.  Baseline: EVAL= 56  Goal status: INITIAL  2.  Pt will decrease 5TSTS by at least 3 seconds in order to demonstrate clinically significant improvement in LE strength. Baseline: EVAL=18.89 sec without UE support Goal status: INITIAL  3.  Pt will decrease TUG to below 14 seconds/decrease in order to demonstrate decreased fall risk.  Baseline: EVAL= 17.23 sec without UE support Goal status: INITIAL  4.  Pt will improve BERG by at least 3 points in order to demonstrate clinically significant improvement in balance.  Baseline: EVAL=45/56 Goal status: INITIAL  5.  Patient will deny any falls during episode of care for optimal safety with all household and community distances  Baseline: EVAL= Unsteady yet no reported falls in last 6 months.  Goal status: INITIAL    ASSESSMENT:  CLINICAL IMPRESSION: Beginning of session reviewed HEP with pt and pt's daughter. Pt tolerating increased AW resistance but requiring min to mod VC's and TC's due to [facility] and possibly baseline  cognition. Pt demonstrating difficulty with vestibular input challenges with head turns and SLS with step over tasks with stepping strategy to correct LOB with variable step lengths and SLS tasks but pt able to correct without physical assist. Pt will continue to benefit from skilled PT services to address balance impairments to reduce falls risk.    OBJECTIVE IMPAIRMENTS: Abnormal gait, decreased balance, decreased cognition, decreased coordination, decreased mobility, difficulty walking, and decreased strength.   ACTIVITY LIMITATIONS: carrying,  lifting, and stairs  PARTICIPATION LIMITATIONS: cleaning, laundry, shopping, community activity, and yard work  PERSONAL FACTORS: 1 comorbidity: dementia  are also affecting patient's functional outcome.   REHAB POTENTIAL: Good  CLINICAL DECISION MAKING: Evolving/moderate complexity  EVALUATION COMPLEXITY: Low  PLAN:  PT FREQUENCY: 1-2x/week  PT DURATION: 12 weeks  PLANNED INTERVENTIONS: Therapeutic exercises, Therapeutic activity, Neuromuscular re-education, Balance training, Gait training, Patient/Family education, Self Care, Joint mobilization, Stair training, Vestibular training, Canalith repositioning, DME instructions, Cryotherapy, Moist heat, and Manual therapy  PLAN FOR NEXT SESSION: Continue with LE strengthening and balance training as appropriate.     Salem Caster. Fairly IV, PT, DPT Physical Therapist- [facility]  09/13/2022, 10:20 AM

## 2022-09-18 ENCOUNTER — Ambulatory Visit: Payer: Medicare Other | Admitting: Speech Pathology

## 2022-09-18 ENCOUNTER — Ambulatory Visit: Payer: Medicare Other

## 2022-09-18 DIAGNOSIS — R269 Unspecified abnormalities of gait and mobility: Secondary | ICD-10-CM

## 2022-09-18 DIAGNOSIS — R262 Difficulty in walking, not elsewhere classified: Secondary | ICD-10-CM | POA: Diagnosis not present

## 2022-09-18 DIAGNOSIS — M6281 Muscle weakness (generalized): Secondary | ICD-10-CM

## 2022-09-18 NOTE — Therapy (Signed)
OUTPATIENT PHYSICAL THERAPY NEURO TREATMENT   Patient Name: Jeanette Newton MRN: [id number] DOB:August 06, 1928, 89 y.o., female Today's Date: 09/18/2022   PCP: Dr. Emily Filbert REFERRING PROVIDER: Dr. Jennings Books  END OF SESSION:  PT End of Session - 09/18/22 0858     Visit Number 4    Number of Visits 24    Date for PT Re-Evaluation 11/29/22    Progress Note Due on Visit 10    PT Start Time 0914    PT Stop Time 0958    PT Time Calculation (min) 44 min    Equipment Utilized During Treatment Gait belt    Activity Tolerance Patient tolerated treatment well    Behavior During Therapy [facility] for tasks assessed/performed             Past Medical History:  Diagnosis Date   Cancer ([place]) 2005-2006   lymphoma   Hypothyroidism    Status post chemoradiation    lymphoma   Status post radiation therapy    Thyroid disease    Wears hearing aid in both ears    Past Surgical History:  Procedure Laterality Date   ABDOMINAL EXPLORATION SURGERY  2005-2006   Dr Pat Patrick   CATARACT EXTRACTION [facility] Left 07/28/2019   Procedure: CATARACT EXTRACTION PHACO AND INTRAOCULAR LENS PLACEMENT ([place]) LEFT 7.83  00:53.4;  Surgeon: Birder Robson, MD;  Location: [place];  Service: Ophthalmology;  Laterality: Left;   CATARACT EXTRACTION W/PHACO Right 08/25/2019   Procedure: CATARACT EXTRACTION PHACO AND INTRAOCULAR LENS PLACEMENT ([facility]) RIGHT 6.04 00:41.0;  Surgeon: Birder Robson, MD;  Location: [place];  Service: Ophthalmology;  Laterality: Right;   CHOLECYSTECTOMY N/A 05/25/2016   Procedure: LAPAROSCOPIC CHOLECYSTECTOMY WITH INTRAOPERATIVE CHOLANGIOGRAM;  Surgeon: Robert Bellow, MD;  Location: [facility];  Service: General;  Laterality: N/A;   ELBOW FRACTURE SURGERY Right    EYE SURGERY     LAPAROTOMY N/A 05/30/2019   Procedure: EXPLORATORY LAPAROTOMYand small bowel resection;  Surgeon: Jules Husbands, MD;  Location: [facility];  Service: General;  Laterality: N/A;   XI ROBOTIC  ASSISTED VENTRAL HERNIA N/A 11/06/2019   Procedure: XI ROBOTIC ASSISTED VENTRAL HERNIA REPAIR WITH MESH;  Surgeon: Jules Husbands, MD;  Location: [facility];  Service: General;  Laterality: N/A;   Patient Active Problem List   Diagnosis Date Noted   Bowel perforation ([place]) 05/30/2019   Calculus of gallbladder with acute cholecystitis without obstruction 05/15/2016    ONSET DATE: Over 6 months  REFERRING DIAG: R26.89 (ICD-10-CM) - Imbalance   THERAPY DIAG:  Difficulty in walking, not elsewhere classified  Muscle weakness (generalized)  Abnormality of gait and mobility  Rationale for Evaluation and Treatment: Rehabilitation  SUBJECTIVE:  SUBJECTIVE STATEMENT: Pt reports some compliance with HEP. Denies soreness after last session. Denies falls. Daughter present.    Pt accompanied by: family member-[name]  PERTINENT HISTORY: Patient is a 89 year old female with Mixed Alzheimers and vascular dementia who lives alone yet daughter stays with her during the day. Reports does not drive and not happy about that yet has been independent with all ADLs- some imbalance however daughter denies any recent falls.   PAIN:  Are you having pain? No  PRECAUTIONS: Fall  WEIGHT BEARING RESTRICTIONS: No  FALLS: Has patient fallen in last 6 months? No  LIVING ENVIRONMENT: Lives with: lives alone and daughter is in and out and lives 2 mi away Lives in: House/apartment Stairs: Yes: Internal: 15 steps; on left going up Has following equipment at home: Single point cane, Walker - 2 wheeled, and Grab bars  PLOF: Independent with basic ADLs, Independent with gait, and Needs assistance with homemaking  PATIENT GOALS: Caregiver goal- is to improve her stability and keep her safe in her home.2  OBJECTIVE:    DIAGNOSTIC FINDINGS:   Narrative & Impression  CLINICAL DATA:  Mixed dementia   EXAM: MRI HEAD WITHOUT CONTRAST   TECHNIQUE: Multiplanar, multiecho pulse sequences of the brain and surrounding structures were obtained without intravenous contrast.   COMPARISON:  None.   FINDINGS: Brain: No acute infarct, mass effect or extra-axial collection. No acute or chronic hemorrhage. There is multifocal hyperintense T2-weighted signal within the white matter. Generalized volume loss without a clear lobar predilection. The midline structures are normal. Old left cerebellar infarct.   Vascular: Major flow voids are preserved.   Skull and upper cervical spine: Normal calvarium and skull base. Visualized upper cervical spine and soft tissues are normal.   Sinuses/Orbits:No paranasal sinus fluid levels or advanced mucosal thickening. No mastoid or middle ear effusion. Normal orbits.   IMPRESSION: 1. No acute intracranial abnormality. 2. Findings of chronic small vessel ischemia and old left cerebellar infarct. 3. Generalized volume loss in a nonspecific pattern. Quantitative, volumetric MRI of the brain may be helpful for more specific evaluation for characteristic atrophy patterns associated with dementia.     Electronically Signed   By: Ulyses Jarred M.D.   On: 11/02/2020 23:42     COGNITION: Overall cognitive status: History of cognitive impairments - at baseline   SENSATION: WFL  COORDINATION: Slow to respond at times  EDEMA:  None observed  MUSCLE TONE: Normal throughout BUE/LE's   POSTURE: rounded shoulders and forward head  LOWER EXTREMITY ROM:     Active  Right Eval Left Eval  Hip flexion 3+ 3+  Hip extension 4 4  Hip abduction 4 4  Hip adduction 4 4  Hip internal rotation 4 4  Hip external rotation 4 4  Knee flexion 4 4  Knee extension 4 4  Ankle dorsiflexion 4 4  Ankle plantarflexion    Ankle inversion    Ankle eversion     (Blank rows =  not tested)  LOWER EXTREMITY ROM  MMT Right Eval Left Eval  Hip flexion WNL  WNL  Hip extension WNL WNL  Hip abduction WNL WNL  Hip adduction WNL WNL  Hip internal rotation WNL WNL  Hip external rotation WNL WNL  Knee flexion WNL WNL  Knee extension WNL WNL  Ankle dorsiflexion WNL WNL  Ankle plantarflexion WNL WNL  Ankle inversion WNL WNL  Ankle eversion WNL WNL  (Blank rows = not tested)    TRANSFERS: Assistive device utilized:  None  Sit to stand: Complete Independence Stand to sit: Complete Independence Chair to chair: Complete Independence Floor:  not tested   GAIT: Gait pattern: decreased step length- Right, decreased step length- Left, and shuffling Distance walked: 150 feet Assistive device utilized: None Level of assistance: SBA Comments: Mild unsteadiness with 180 deg turning and shuffling gait noted  FUNCTIONAL TESTS:  5 times sit to stand: 18.89 sec without UE Support Timed up and go (TUG): 17.23 sec without an AD Berg Balance Scale: 45/56  PATIENT SURVEYS:  FOTO 56  TODAY'S TREATMENT:                                                                                                                              DATE:  09/18/2022  Therex:   STS: 3x10. Min VC's for anterior weight shift coming into standing and TKE upon upright standing.   Standing version of HEP education and performance with 5# AW's:   Alternating marches: 2x10  Hip abduction: 2x10  Hamstring curl: 2x10  Heel to toe raises: 2x10    Min to mod multimodal cuing on first set. Excellent carryover after first set.    Alternating 6" step up with SUE support on balance bar. 2x10, CGA. Mod to max VC's for sequencing her limbs.     Neuro Re-Ed: CGA throughout session  Ball tosses on airex pad forwards and outside BOS to R/L. 5 min total CGA    SLS: LLE 2x30 sec   Length of hallway: CGA throughout. Max VC's for sequencing head turns.    X2 horizontal head turns     X2 vertical head  turns        Access Code: [email or web address] URL: [email or web address] Date: 09/11/2022 Prepared by: Sande Brothers   Exercises - Seated Hip Flexion March with Ankle Weights  - 1 x daily - 3 x weekly - 3 sets - 10 reps - Seated Hip Abduction with Resistance  - 1 x daily - 3 x weekly - 3 sets - 10 reps - Seated Hip Adduction Isometrics with Ball  - 1 x daily - 3 x weekly - 3 sets - 10 reps - Seated Long Arc Quad with Ankle Weight  - 1 x daily - 3 x weekly - 3 sets - 10 reps - Seated Hamstring Curl with Anchored Resistance  - 1 x daily - 3 x weekly - 3 sets - 10 reps - Seated Heel Raise  - 1 x daily - 3 x weekly - 3 sets - 10 reps - Seated Heel Toe Raises  - 1 x daily - 3 x weekly - 3 sets - 10 reps - Sit to Stand with Arms Crossed  - 1 x daily - 3 x weekly - 3 sets - 10 reps  PATIENT EDUCATION: Education details: purpose of PT and how this service may assist in improving her balance Person educated: Patient, Child(ren), and [name] Education method: Explanation, Demonstration, Tactile cues,  and Verbal cues Education comprehension: verbalized understanding, returned demonstration, verbal cues required, tactile cues required, and needs further education  HOME EXERCISE PROGRAM: Access Code: [email or web address] URL: [email or web address] Date: 09/11/2022 Prepared by: Sande Brothers  Exercises - Seated Hip Flexion March with Ankle Weights  - 1 x daily - 3 x weekly - 3 sets - 10 reps - Seated Hip Abduction with Resistance  - 1 x daily - 3 x weekly - 3 sets - 10 reps - Seated Hip Adduction Isometrics with Ball  - 1 x daily - 3 x weekly - 3 sets - 10 reps - Seated Long Arc Quad with Ankle Weight  - 1 x daily - 3 x weekly - 3 sets - 10 reps - Seated Hamstring Curl with Anchored Resistance  - 1 x daily - 3 x weekly - 3 sets - 10 reps - Seated Heel Raise  - 1 x daily - 3 x weekly - 3 sets - 10 reps - Seated Heel Toe Raises  - 1 x daily - 3 x weekly - 3 sets - 10  reps - Sit to Stand with Arms Crossed  - 1 x daily - 3 x weekly - 3 sets - 10 reps  GOALS: Goals reviewed with patient? Yes  SHORT TERM GOALS: Target date: 10/18/2022  Pt and caregiver (daughter) will be independent with HEP in order to improve strength and balance in order to decrease fall risk and improve function at home and work Baseline: EVAL- Patient has no formal HEP and admits to not exercising.  Goal status: INITIAL   LONG TERM GOALS: Target date: 11/29/2022  Pt will improve FOTO to target score  of > 57 to display perceived improvements in ability to complete ADL's.  Baseline: EVAL= 56  Goal status: INITIAL  2.  Pt will decrease 5TSTS by at least 3 seconds in order to demonstrate clinically significant improvement in LE strength. Baseline: EVAL=18.89 sec without UE support Goal status: INITIAL  3.  Pt will decrease TUG to below 14 seconds/decrease in order to demonstrate decreased fall risk.  Baseline: EVAL= 17.23 sec without UE support Goal status: INITIAL  4.  Pt will improve BERG by at least 3 points in order to demonstrate clinically significant improvement in balance.  Baseline: EVAL=45/56 Goal status: INITIAL  5.  Patient will deny any falls during episode of care for optimal safety with all household and community distances  Baseline: EVAL= Unsteady yet no reported falls in last 6 months.  Goal status: INITIAL    ASSESSMENT:  CLINICAL IMPRESSION: Continuing PT POC with focus on progressing HEP to standing tasks. Pt with great understanding of HEP from sitting forms to standing forms. Pt still demonstrates deficits in dynamic balance with L stepping strategy frequently with horizontal head turns and turning around in hallways. Pt remains highly motivated throughout session. Pt will continue to benefit from skilled PT services to address balance impairments to reduce falls risk.     OBJECTIVE IMPAIRMENTS: Abnormal gait, decreased balance, decreased cognition,  decreased coordination, decreased mobility, difficulty walking, and decreased strength.   ACTIVITY LIMITATIONS: carrying, lifting, and stairs  PARTICIPATION LIMITATIONS: cleaning, laundry, shopping, community activity, and yard work  PERSONAL FACTORS: 1 comorbidity: dementia  are also affecting patient's functional outcome.   REHAB POTENTIAL: Good  CLINICAL DECISION MAKING: Evolving/moderate complexity  EVALUATION COMPLEXITY: Low  PLAN:  PT FREQUENCY: 1-2x/week  PT DURATION: 12 weeks  PLANNED INTERVENTIONS: Therapeutic exercises, Therapeutic activity, Neuromuscular re-education, Balance training, Gait training, Patient/Family education, Self  Care, Joint mobilization, Stair training, Vestibular training, Canalith repositioning, DME instructions, Cryotherapy, Moist heat, and Manual therapy  PLAN FOR NEXT SESSION: Continue with LE strengthening and balance training as appropriate.     Salem Caster. Fairly IV, PT, DPT Physical Therapist- [facility]  09/18/2022, 10:59 AM

## 2022-09-20 ENCOUNTER — Ambulatory Visit: Payer: Medicare Other | Admitting: Speech Pathology

## 2022-09-20 ENCOUNTER — Ambulatory Visit: Payer: Medicare Other

## 2022-09-20 DIAGNOSIS — R41841 Cognitive communication deficit: Secondary | ICD-10-CM

## 2022-09-20 DIAGNOSIS — M6281 Muscle weakness (generalized): Secondary | ICD-10-CM

## 2022-09-20 DIAGNOSIS — R278 Other lack of coordination: Secondary | ICD-10-CM

## 2022-09-20 DIAGNOSIS — R262 Difficulty in walking, not elsewhere classified: Secondary | ICD-10-CM

## 2022-09-20 DIAGNOSIS — R269 Unspecified abnormalities of gait and mobility: Secondary | ICD-10-CM

## 2022-09-20 NOTE — Therapy (Signed)
OUTPATIENT PHYSICAL THERAPY NEURO TREATMENT   Patient Name: Jeanette Newton MRN: [id number] DOB:1928/06/20, 89 y.o., female Today's Date: 09/20/2022   PCP: Dr. Emily Filbert REFERRING PROVIDER: Dr. Jennings Books  END OF SESSION:  PT End of Session - 09/20/22 0907     Visit Number 5    Number of Visits 24    Date for PT Re-Evaluation 11/29/22    Progress Note Due on Visit 10    PT Start Time 0910    PT Stop Time 0952    PT Time Calculation (min) 42 min    Equipment Utilized During Treatment Gait belt    Activity Tolerance Patient tolerated treatment well    Behavior During Therapy [facility] for tasks assessed/performed             Past Medical History:  Diagnosis Date   Cancer ([place]) 2005-2006   lymphoma   Hypothyroidism    Status post chemoradiation    lymphoma   Status post radiation therapy    Thyroid disease    Wears hearing aid in both ears    Past Surgical History:  Procedure Laterality Date   ABDOMINAL EXPLORATION SURGERY  2005-2006   Dr Pat Patrick   CATARACT EXTRACTION [facility] Left 07/28/2019   Procedure: CATARACT EXTRACTION PHACO AND INTRAOCULAR LENS PLACEMENT ([place]) LEFT 7.83  00:53.4;  Surgeon: Birder Robson, MD;  Location: [place];  Service: Ophthalmology;  Laterality: Left;   CATARACT EXTRACTION W/PHACO Right 08/25/2019   Procedure: CATARACT EXTRACTION PHACO AND INTRAOCULAR LENS PLACEMENT ([facility]) RIGHT 6.04 00:41.0;  Surgeon: Birder Robson, MD;  Location: [place];  Service: Ophthalmology;  Laterality: Right;   CHOLECYSTECTOMY N/A 05/25/2016   Procedure: LAPAROSCOPIC CHOLECYSTECTOMY WITH INTRAOPERATIVE CHOLANGIOGRAM;  Surgeon: Robert Bellow, MD;  Location: [facility];  Service: General;  Laterality: N/A;   ELBOW FRACTURE SURGERY Right    EYE SURGERY     LAPAROTOMY N/A 05/30/2019   Procedure: EXPLORATORY LAPAROTOMYand small bowel resection;  Surgeon: Jules Husbands, MD;  Location: [facility];  Service: General;  Laterality: N/A;   XI ROBOTIC  ASSISTED VENTRAL HERNIA N/A 11/06/2019   Procedure: XI ROBOTIC ASSISTED VENTRAL HERNIA REPAIR WITH MESH;  Surgeon: Jules Husbands, MD;  Location: [facility];  Service: General;  Laterality: N/A;   Patient Active Problem List   Diagnosis Date Noted   Bowel perforation ([place]) 05/30/2019   Calculus of gallbladder with acute cholecystitis without obstruction 05/15/2016    ONSET DATE: Over 6 months  REFERRING DIAG: R26.89 (ICD-10-CM) - Imbalance   THERAPY DIAG:  Difficulty in walking, not elsewhere classified  Muscle weakness (generalized)  Abnormality of gait and mobility  Cognitive communication deficit  Other lack of coordination  Rationale for Evaluation and Treatment: Rehabilitation  SUBJECTIVE:  SUBJECTIVE STATEMENT: Pt reports no soreness from previous sessions. No falls or LOB. Daughter, [name] present.    Pt accompanied by: family member-[name]  PERTINENT HISTORY: Patient is a 89 year old female with Mixed Alzheimers and vascular dementia who lives alone yet daughter stays with her during the day. Reports does not drive and not happy about that yet has been independent with all ADLs- some imbalance however daughter denies any recent falls.   PAIN:  Are you having pain? No  PRECAUTIONS: Fall  WEIGHT BEARING RESTRICTIONS: No  FALLS: Has patient fallen in last 6 months? No  LIVING ENVIRONMENT: Lives with: lives alone and daughter is in and out and lives 2 mi away Lives in: House/apartment Stairs: Yes: Internal: 15 steps; on left going up Has following equipment at home: Single point cane, Walker - 2 wheeled, and Grab bars  PLOF: Independent with basic ADLs, Independent with gait, and Needs assistance with homemaking  PATIENT GOALS: Caregiver goal- is to improve her stability and keep  her safe in her home.2  OBJECTIVE:   DIAGNOSTIC FINDINGS:   Narrative & Impression  CLINICAL DATA:  Mixed dementia   EXAM: MRI HEAD WITHOUT CONTRAST   TECHNIQUE: Multiplanar, multiecho pulse sequences of the brain and surrounding structures were obtained without intravenous contrast.   COMPARISON:  None.   FINDINGS: Brain: No acute infarct, mass effect or extra-axial collection. No acute or chronic hemorrhage. There is multifocal hyperintense T2-weighted signal within the white matter. Generalized volume loss without a clear lobar predilection. The midline structures are normal. Old left cerebellar infarct.   Vascular: Major flow voids are preserved.   Skull and upper cervical spine: Normal calvarium and skull base. Visualized upper cervical spine and soft tissues are normal.   Sinuses/Orbits:No paranasal sinus fluid levels or advanced mucosal thickening. No mastoid or middle ear effusion. Normal orbits.   IMPRESSION: 1. No acute intracranial abnormality. 2. Findings of chronic small vessel ischemia and old left cerebellar infarct. 3. Generalized volume loss in a nonspecific pattern. Quantitative, volumetric MRI of the brain may be helpful for more specific evaluation for characteristic atrophy patterns associated with dementia.     Electronically Signed   By: Ulyses Jarred M.D.   On: 11/02/2020 23:42     COGNITION: Overall cognitive status: History of cognitive impairments - at baseline   SENSATION: WFL  COORDINATION: Slow to respond at times  EDEMA:  None observed  MUSCLE TONE: Normal throughout BUE/LE's   POSTURE: rounded shoulders and forward head  LOWER EXTREMITY ROM:     Active  Right Eval Left Eval  Hip flexion 3+ 3+  Hip extension 4 4  Hip abduction 4 4  Hip adduction 4 4  Hip internal rotation 4 4  Hip external rotation 4 4  Knee flexion 4 4  Knee extension 4 4  Ankle dorsiflexion 4 4  Ankle plantarflexion    Ankle inversion     Ankle eversion     (Blank rows = not tested)  LOWER EXTREMITY ROM  MMT Right Eval Left Eval  Hip flexion WNL  WNL  Hip extension WNL WNL  Hip abduction WNL WNL  Hip adduction WNL WNL  Hip internal rotation WNL WNL  Hip external rotation WNL WNL  Knee flexion WNL WNL  Knee extension WNL WNL  Ankle dorsiflexion WNL WNL  Ankle plantarflexion WNL WNL  Ankle inversion WNL WNL  Ankle eversion WNL WNL  (Blank rows = not tested)    TRANSFERS: Assistive device utilized: None  Sit to stand: Complete Independence Stand to sit: Complete Independence Chair to chair: Complete Independence Floor:  not tested   GAIT: Gait pattern: decreased step length- Right, decreased step length- Left, and shuffling Distance walked: 150 feet Assistive device utilized: None Level of assistance: SBA Comments: Mild unsteadiness with 180 deg turning and shuffling gait noted  FUNCTIONAL TESTS:  5 times sit to stand: 18.89 sec without UE Support Timed up and go (TUG): 17.23 sec without an AD Berg Balance Scale: 45/56  PATIENT SURVEYS:  FOTO 56  TODAY'S TREATMENT:                                                                                                                              DATE:  09/20/2022   Therex:    STS: 3x8. Min VC's for anterior weight shift coming into standing and TKE upon upright standing. Progressed to weighted 2 KG med ball throughout. Excellent carryover.    Forwards marches with 5# AW's in // bars. X6 laps   Lateral marches with 5# AW's in // bars. X6 laps     Neuro Re-ed:   Standing on airex pad shooting basketballs into hoop for ankle and hip righting reactions, forward reaching outside BOS with shots, improving vestibular input.    X3 cycles with basket of balls. CGA.     Obstacle course: alternating cone taps (6 cones, 3/LE)  ambulating over unstable surface (red mat table) x3  bolster step overs  6" box step up and over. X4 laps    Zig zags around 8  cones to work on change in direction/turns. CGA, 4 laps.     Pt reliant on PT demo, min to mod multimodal cuing and visual targeting for successful carryover in exercises.             PATIENT EDUCATION: Education details: purpose of PT and how this service may assist in improving her balance Person educated: Patient, Child(ren), and [name] Education method: Explanation, Demonstration, Tactile cues, and Verbal cues Education comprehension: verbalized understanding, returned demonstration, verbal cues required, tactile cues required, and needs further education  HOME EXERCISE PROGRAM: Access Code: [email or web address] URL: [email or web address] Date: 09/11/2022 Prepared by: Sande Brothers  Exercises - Seated Hip Flexion March with Ankle Weights  - 1 x daily - 3 x weekly - 3 sets - 10 reps - Seated Hip Abduction with Resistance  - 1 x daily - 3 x weekly - 3 sets - 10 reps - Seated Hip Adduction Isometrics with Ball  - 1 x daily - 3 x weekly - 3 sets - 10 reps - Seated Long Arc Quad with Ankle Weight  - 1 x daily - 3 x weekly - 3 sets - 10 reps - Seated Hamstring Curl with Anchored Resistance  - 1 x daily - 3 x weekly - 3 sets - 10 reps - Seated Heel Raise  - 1 x daily - 3 x weekly - 3 sets -  10 reps - Seated Heel Toe Raises  - 1 x daily - 3 x weekly - 3 sets - 10 reps - Sit to Stand with Arms Crossed  - 1 x daily - 3 x weekly - 3 sets - 10 reps  GOALS: Goals reviewed with patient? Yes  SHORT TERM GOALS: Target date: 10/18/2022  Pt and caregiver (daughter) will be independent with HEP in order to improve strength and balance in order to decrease fall risk and improve function at home and work Baseline: EVAL- Patient has no formal HEP and admits to not exercising.  Goal status: INITIAL   LONG TERM GOALS: Target date: 11/29/2022  Pt will improve FOTO to target score  of > 57 to display perceived improvements in ability to complete ADL's.  Baseline: EVAL= 56   Goal status: INITIAL  2.  Pt will decrease 5TSTS by at least 3 seconds in order to demonstrate clinically significant improvement in LE strength. Baseline: EVAL=18.89 sec without UE support Goal status: INITIAL  3.  Pt will decrease TUG to below 14 seconds/decrease in order to demonstrate decreased fall risk.  Baseline: EVAL= 17.23 sec without UE support Goal status: INITIAL  4.  Pt will improve BERG by at least 3 points in order to demonstrate clinically significant improvement in balance.  Baseline: EVAL=45/56 Goal status: INITIAL  5.  Patient will deny any falls during episode of care for optimal safety with all household and community distances  Baseline: EVAL= Unsteady yet no reported falls in last 6 months.  Goal status: INITIAL    ASSESSMENT:  CLINICAL IMPRESSION: Continuing PT POC with focus on dynamic balance and strengthening to progress balance with strength. Pt remains with good motivation throughout session. She is reliant on frequent SUE support on objects due to general lateral sway and unsteadiness and does rely on multimodal cuing and visual targets for improved completion of exercises correctly. Pt will continue to benefit from skilled PT services to address balance impairments to reduce falls risk.     OBJECTIVE IMPAIRMENTS: Abnormal gait, decreased balance, decreased cognition, decreased coordination, decreased mobility, difficulty walking, and decreased strength.   ACTIVITY LIMITATIONS: carrying, lifting, and stairs  PARTICIPATION LIMITATIONS: cleaning, laundry, shopping, community activity, and yard work  PERSONAL FACTORS: 1 comorbidity: dementia  are also affecting patient's functional outcome.   REHAB POTENTIAL: Good  CLINICAL DECISION MAKING: Evolving/moderate complexity  EVALUATION COMPLEXITY: Low  PLAN:  PT FREQUENCY: 1-2x/week  PT DURATION: 12 weeks  PLANNED INTERVENTIONS: Therapeutic exercises, Therapeutic activity, Neuromuscular  re-education, Balance training, Gait training, Patient/Family education, Self Care, Joint mobilization, Stair training, Vestibular training, Canalith repositioning, DME instructions, Cryotherapy, Moist heat, and Manual therapy  PLAN FOR NEXT SESSION: Continue with LE strengthening and balance training as appropriate.     Salem Caster. Fairly IV, PT, DPT Physical Therapist- [facility]  09/20/2022, 10:14 AM

## 2022-09-24 NOTE — Therapy (Signed)
OUTPATIENT PHYSICAL THERAPY NEURO TREATMENT   Patient Name: Jeanette Newton MRN: [id number] DOB:1928-01-12, 89 y.o., female Today's Date: 09/25/2022   PCP: Dr. Emily Filbert REFERRING PROVIDER: Dr. Jennings Books  END OF SESSION:  PT End of Session - 09/25/22 0923     Visit Number 6    Number of Visits 24    Date for PT Re-Evaluation 11/29/22    Progress Note Due on Visit 10    PT Start Time 0918    PT Stop Time 0958    PT Time Calculation (min) 40 min    Equipment Utilized During Treatment Gait belt    Activity Tolerance Patient tolerated treatment well    Behavior During Therapy [facility] for tasks assessed/performed              Past Medical History:  Diagnosis Date   Cancer ([place]) 2005-2006   lymphoma   Hypothyroidism    Status post chemoradiation    lymphoma   Status post radiation therapy    Thyroid disease    Wears hearing aid in both ears    Past Surgical History:  Procedure Laterality Date   ABDOMINAL EXPLORATION SURGERY  2005-2006   Dr Pat Patrick   CATARACT EXTRACTION [facility] Left 07/28/2019   Procedure: CATARACT EXTRACTION PHACO AND INTRAOCULAR LENS PLACEMENT ([place]) LEFT 7.83  00:53.4;  Surgeon: Birder Robson, MD;  Location: [place];  Service: Ophthalmology;  Laterality: Left;   CATARACT EXTRACTION W/PHACO Right 08/25/2019   Procedure: CATARACT EXTRACTION PHACO AND INTRAOCULAR LENS PLACEMENT ([facility]) RIGHT 6.04 00:41.0;  Surgeon: Birder Robson, MD;  Location: [place];  Service: Ophthalmology;  Laterality: Right;   CHOLECYSTECTOMY N/A 05/25/2016   Procedure: LAPAROSCOPIC CHOLECYSTECTOMY WITH INTRAOPERATIVE CHOLANGIOGRAM;  Surgeon: Robert Bellow, MD;  Location: [facility];  Service: General;  Laterality: N/A;   ELBOW FRACTURE SURGERY Right    EYE SURGERY     LAPAROTOMY N/A 05/30/2019   Procedure: EXPLORATORY LAPAROTOMYand small bowel resection;  Surgeon: Jules Husbands, MD;  Location: [facility];  Service: General;  Laterality: N/A;   XI ROBOTIC  ASSISTED VENTRAL HERNIA N/A 11/06/2019   Procedure: XI ROBOTIC ASSISTED VENTRAL HERNIA REPAIR WITH MESH;  Surgeon: Jules Husbands, MD;  Location: [facility];  Service: General;  Laterality: N/A;   Patient Active Problem List   Diagnosis Date Noted   Bowel perforation ([place]) 05/30/2019   Calculus of gallbladder with acute cholecystitis without obstruction 05/15/2016    ONSET DATE: Over 6 months  REFERRING DIAG: R26.89 (ICD-10-CM) - Imbalance   THERAPY DIAG:  Difficulty in walking, not elsewhere classified  Muscle weakness (generalized)  Abnormality of gait and mobility  Cognitive communication deficit  Other lack of coordination  Rationale for Evaluation and Treatment: Rehabilitation  SUBJECTIVE:  SUBJECTIVE STATEMENT: Patient reports doing well and denies any pain or falls.    Pt accompanied by: family member-[name]  PERTINENT HISTORY: Patient is a 89 year old female with Mixed Alzheimers and vascular dementia who lives alone yet daughter stays with her during the day. Reports does not drive and not happy about that yet has been independent with all ADLs- some imbalance however daughter denies any recent falls.   PAIN:  Are you having pain? No  PRECAUTIONS: Fall  WEIGHT BEARING RESTRICTIONS: No  FALLS: Has patient fallen in last 6 months? No  LIVING ENVIRONMENT: Lives with: lives alone and daughter is in and out and lives 2 mi away Lives in: House/apartment Stairs: Yes: Internal: 15 steps; on left going up Has following equipment at home: Single point cane, Walker - 2 wheeled, and Grab bars  PLOF: Independent with basic ADLs, Independent with gait, and Needs assistance with homemaking  PATIENT GOALS: Caregiver goal- is to improve her stability and keep her safe in her  home.2  OBJECTIVE:   DIAGNOSTIC FINDINGS:   Narrative & Impression  CLINICAL DATA:  Mixed dementia   EXAM: MRI HEAD WITHOUT CONTRAST   TECHNIQUE: Multiplanar, multiecho pulse sequences of the brain and surrounding structures were obtained without intravenous contrast.   COMPARISON:  None.   FINDINGS: Brain: No acute infarct, mass effect or extra-axial collection. No acute or chronic hemorrhage. There is multifocal hyperintense T2-weighted signal within the white matter. Generalized volume loss without a clear lobar predilection. The midline structures are normal. Old left cerebellar infarct.   Vascular: Major flow voids are preserved.   Skull and upper cervical spine: Normal calvarium and skull base. Visualized upper cervical spine and soft tissues are normal.   Sinuses/Orbits:No paranasal sinus fluid levels or advanced mucosal thickening. No mastoid or middle ear effusion. Normal orbits.   IMPRESSION: 1. No acute intracranial abnormality. 2. Findings of chronic small vessel ischemia and old left cerebellar infarct. 3. Generalized volume loss in a nonspecific pattern. Quantitative, volumetric MRI of the brain may be helpful for more specific evaluation for characteristic atrophy patterns associated with dementia.     Electronically Signed   By: Ulyses Jarred M.D.   On: 11/02/2020 23:42     COGNITION: Overall cognitive status: History of cognitive impairments - at baseline   SENSATION: WFL  COORDINATION: Slow to respond at times  EDEMA:  None observed  MUSCLE TONE: Normal throughout BUE/LE's   POSTURE: rounded shoulders and forward head  LOWER EXTREMITY ROM:     Active  Right Eval Left Eval  Hip flexion 3+ 3+  Hip extension 4 4  Hip abduction 4 4  Hip adduction 4 4  Hip internal rotation 4 4  Hip external rotation 4 4  Knee flexion 4 4  Knee extension 4 4  Ankle dorsiflexion 4 4  Ankle plantarflexion    Ankle inversion    Ankle eversion      (Blank rows = not tested)  LOWER EXTREMITY ROM  MMT Right Eval Left Eval  Hip flexion WNL  WNL  Hip extension WNL WNL  Hip abduction WNL WNL  Hip adduction WNL WNL  Hip internal rotation WNL WNL  Hip external rotation WNL WNL  Knee flexion WNL WNL  Knee extension WNL WNL  Ankle dorsiflexion WNL WNL  Ankle plantarflexion WNL WNL  Ankle inversion WNL WNL  Ankle eversion WNL WNL  (Blank rows = not tested)    TRANSFERS: Assistive device utilized: None  Sit to stand:  Complete Independence Stand to sit: Complete Independence Chair to chair: Complete Independence Floor:  not tested   GAIT: Gait pattern: decreased step length- Right, decreased step length- Left, and shuffling Distance walked: 150 feet Assistive device utilized: None Level of assistance: SBA Comments: Mild unsteadiness with 180 deg turning and shuffling gait noted  FUNCTIONAL TESTS:  5 times sit to stand: 18.89 sec without UE Support Timed up and go (TUG): 17.23 sec without an AD Berg Balance Scale: 45/56  PATIENT SURVEYS:  FOTO 56  TODAY'S TREATMENT:                                                                                                                              DATE:  09/25/2022   Therex:    Seated hip march 2 sets x 12 reps with 2.5# AW Seated knee ext- 2 sets x 12 reps with 2.5# AW Seated hip flex/abd up and over cone 2.5 # AW  STS: 2x10 while arms holding 2 kg ball- Min VC's for anterior weight shift coming into standing.   Forward step up with 2.5# AW x 8 each LE with single leg support then 8 more each LE without UE support.     Neuro Re-ed:   Standing on airex pad - dynamic march x 20 reps each. Mild unsteadiness with some UE reaching for railing.  Staggered standing on airex pad  - hold 30 sec x 2 trials each side- More unsteadiness with right LE in rear position.  Tandem standing - in // bars without UE support x 30 sec x 2 SLS-Attempted yet patient unable to hold > 2  sec without railing support.    Pt reliant on PT demo, min to mod multimodal cuing and visual targeting for successful carryover in exercises.             PATIENT EDUCATION: Education details: purpose of PT and how this service may assist in improving her balance Person educated: Patient, Child(ren), and [name] Education method: Explanation, Demonstration, Tactile cues, and Verbal cues Education comprehension: verbalized understanding, returned demonstration, verbal cues required, tactile cues required, and needs further education  HOME EXERCISE PROGRAM: Access Code: [email or web address] URL: [email or web address] Date: 09/11/2022 Prepared by: Sande Brothers  Exercises - Seated Hip Flexion March with Ankle Weights  - 1 x daily - 3 x weekly - 3 sets - 10 reps - Seated Hip Abduction with Resistance  - 1 x daily - 3 x weekly - 3 sets - 10 reps - Seated Hip Adduction Isometrics with Ball  - 1 x daily - 3 x weekly - 3 sets - 10 reps - Seated Long Arc Quad with Ankle Weight  - 1 x daily - 3 x weekly - 3 sets - 10 reps - Seated Hamstring Curl with Anchored Resistance  - 1 x daily - 3 x weekly - 3 sets - 10 reps - Seated Heel Raise  - 1 x daily - 3  x weekly - 3 sets - 10 reps - Seated Heel Toe Raises  - 1 x daily - 3 x weekly - 3 sets - 10 reps - Sit to Stand with Arms Crossed  - 1 x daily - 3 x weekly - 3 sets - 10 reps  GOALS: Goals reviewed with patient? Yes  SHORT TERM GOALS: Target date: 10/18/2022  Pt and caregiver (daughter) will be independent with HEP in order to improve strength and balance in order to decrease fall risk and improve function at home and work Baseline: EVAL- Patient has no formal HEP and admits to not exercising.  Goal status: INITIAL   LONG TERM GOALS: Target date: 11/29/2022  Pt will improve FOTO to target score  of > 57 to display perceived improvements in ability to complete ADL's.  Baseline: EVAL= 56  Goal status: INITIAL  2.   Pt will decrease 5TSTS by at least 3 seconds in order to demonstrate clinically significant improvement in LE strength. Baseline: EVAL=18.89 sec without UE support Goal status: INITIAL  3.  Pt will decrease TUG to below 14 seconds/decrease in order to demonstrate decreased fall risk.  Baseline: EVAL= 17.23 sec without UE support Goal status: INITIAL  4.  Pt will improve BERG by at least 3 points in order to demonstrate clinically significant improvement in balance.  Baseline: EVAL=45/56 Goal status: INITIAL  5.  Patient will deny any falls during episode of care for optimal safety with all household and community distances  Baseline: EVAL= Unsteady yet no reported falls in last 6 months.  Goal status: INITIAL    ASSESSMENT:  CLINICAL IMPRESSION: Continuing PT POC with focus on dynamic balance and strengthening to progress balance with strength. Patient fatigued after performing seated therex exhibiting increased faituge left vs. Right LE. She was able to follow cues but seems to perform better with demonstration. She struggled with narrowed feet balance exercises and will continue to be point of focus to improve her balance with standing and walking.  Pt will continue to benefit from skilled PT services to address balance impairments to reduce falls risk.     OBJECTIVE IMPAIRMENTS: Abnormal gait, decreased balance, decreased cognition, decreased coordination, decreased mobility, difficulty walking, and decreased strength.   ACTIVITY LIMITATIONS: carrying, lifting, and stairs  PARTICIPATION LIMITATIONS: cleaning, laundry, shopping, community activity, and yard work  PERSONAL FACTORS: 1 comorbidity: dementia  are also affecting patient's functional outcome.   REHAB POTENTIAL: Good  CLINICAL DECISION MAKING: Evolving/moderate complexity  EVALUATION COMPLEXITY: Low  PLAN:  PT FREQUENCY: 1-2x/week  PT DURATION: 12 weeks  PLANNED INTERVENTIONS: Therapeutic exercises,  Therapeutic activity, Neuromuscular re-education, Balance training, Gait training, Patient/Family education, Self Care, Joint mobilization, Stair training, Vestibular training, Canalith repositioning, DME instructions, Cryotherapy, Moist heat, and Manual therapy  PLAN FOR NEXT SESSION: Continue with LE strengthening and balance training as appropriate.     Ollen Bowl, PT Physical Therapist- [facility]  09/25/2022, 2:23 PM

## 2022-09-25 ENCOUNTER — Ambulatory Visit: Payer: Medicare Other

## 2022-09-25 ENCOUNTER — Ambulatory Visit: Payer: Medicare Other | Admitting: Speech Pathology

## 2022-09-25 DIAGNOSIS — M6281 Muscle weakness (generalized): Secondary | ICD-10-CM

## 2022-09-25 DIAGNOSIS — R269 Unspecified abnormalities of gait and mobility: Secondary | ICD-10-CM

## 2022-09-25 DIAGNOSIS — R41841 Cognitive communication deficit: Secondary | ICD-10-CM

## 2022-09-25 DIAGNOSIS — R262 Difficulty in walking, not elsewhere classified: Secondary | ICD-10-CM

## 2022-09-25 DIAGNOSIS — R278 Other lack of coordination: Secondary | ICD-10-CM

## 2022-09-25 NOTE — Therapy (Signed)
OUTPATIENT SPEECH LANGUAGE PATHOLOGY TREATMENT NOTE DISCHARGE SUMMARY   Patient Name: Jeanette Newton MRN: [id number] DOB:06-23-1928, 89 y.o., female Today's Date: 09/25/2022  PCP: Emily Filbert, MD REFERRING PROVIDER: Jennings Books, MD  END OF SESSION:   End of Session - 09/25/22 1253     Visit Number 3    Number of Visits 17    Date for SLP Re-Evaluation 11/01/22    Authorization Type [employer]    Progress Note Due on Visit 10    SLP Start Time 1000    SLP Stop Time  1045    SLP Time Calculation (min) 45 min    Activity Tolerance Patient tolerated treatment well             Past Medical History:  Diagnosis Date   Cancer ([place]) 2005-2006   lymphoma   Hypothyroidism    Status post chemoradiation    lymphoma   Status post radiation therapy    Thyroid disease    Wears hearing aid in both ears    Past Surgical History:  Procedure Laterality Date   ABDOMINAL EXPLORATION SURGERY  2005-2006   Dr Pat Patrick   CATARACT EXTRACTION [facility] Left 07/28/2019   Procedure: CATARACT EXTRACTION PHACO AND INTRAOCULAR LENS PLACEMENT ([place]) LEFT 7.83  00:53.4;  Surgeon: Birder Robson, MD;  Location: [place];  Service: Ophthalmology;  Laterality: Left;   CATARACT EXTRACTION W/PHACO Right 08/25/2019   Procedure: CATARACT EXTRACTION PHACO AND INTRAOCULAR LENS PLACEMENT ([facility]) RIGHT 6.04 00:41.0;  Surgeon: Birder Robson, MD;  Location: [place];  Service: Ophthalmology;  Laterality: Right;   CHOLECYSTECTOMY N/A 05/25/2016   Procedure: LAPAROSCOPIC CHOLECYSTECTOMY WITH INTRAOPERATIVE CHOLANGIOGRAM;  Surgeon: Robert Bellow, MD;  Location: [facility];  Service: General;  Laterality: N/A;   ELBOW FRACTURE SURGERY Right    EYE SURGERY     LAPAROTOMY N/A 05/30/2019   Procedure: EXPLORATORY LAPAROTOMYand small bowel resection;  Surgeon: Jules Husbands, MD;  Location: [facility];  Service: General;  Laterality: N/A;   XI ROBOTIC ASSISTED VENTRAL HERNIA N/A  11/06/2019   Procedure: XI ROBOTIC ASSISTED VENTRAL HERNIA REPAIR WITH MESH;  Surgeon: Jules Husbands, MD;  Location: [facility];  Service: General;  Laterality: N/A;   Patient Active Problem List   Diagnosis Date Noted   Bowel perforation ([place]) 05/30/2019   Calculus of gallbladder with acute cholecystitis without obstruction 05/15/2016    ONSET DATE: 09/2020;  05/10/2022 date of referral   REFERRING DIAG:  G30.9 (ICD-10-CM) - Alzheimer's disease, unspecified  F01.50 (ICD-10-CM) - Vascular dementia ([place])      PERTINENT HISTORY: Pt is a 89 year old female with diagnosis of Mixed Dementia (Alzheimer's + Vascular) with mild age-related atrophy and chronic microvascular ischemic changes as well as small stroke in the left cerebellum.    THERAPY DIAG:  Cognitive communication deficit  Rationale for Evaluation and Treatment Rehabilitation  SUBJECTIVE: pt pleasant, HOH, poor historian  Pt accompanied by: family member  PAIN:  Are you having pain? No  PATIENT GOALS: to continue living safely in her home  OBJECTIVE:   TODAY'S TREATMENT: Skilled treatment session focused on pt's cognitive communication goals, specifically goals surrounding safety within her home. SLP facilitated session by providing the following interventions:  Pt and her daughter report that are pleased with previously made recommendations and currently have the louder ringer installed on pt's phone. This has been helpful in pt answering her phone in a timely manner. Pt's daughter continues to investigate Life [employer] and is  in the process of gathering medical information in a central location in pt's house for easy access.       PATIENT EDUCATION: Education details: see above Person educated: Patient and Child(ren) Education method: Explanation, Demonstration, Verbal cues, and Handouts Education comprehension: verbalized understanding    GOALS: Goals reviewed with patient? Yes   SHORT TERM GOALS: Target date:  10 sessions   Pt and caregiver will increase understanding of medical condition by answering basic [place] questions with 80% accuracy. Baseline: New Goal Goal status: MET   2.  Pt will use external memory aids to help with orientation and recall of when appts are with 50% ability.  Baseline:  Goal status: MET   LONG TERM GOALS: Target date: 11/01/2022 Pt and caregiver will utilize cognitive compensatory strategies to increase pt's safety within pt's home environment.  Baseline:  Goal status: MET    ASSESSMENT:  CLINICAL IMPRESSION: Pt and her daughter don't have any further concerns at this time. Pt is appropriate for discharge.     Jenisis Harmsen B. Rutherford Nail, M.S., CCC-SLP, Mining engineer Certified Brain Injury [place]  [place] Office [phone] Ascom [phone] Fax [phone]

## 2022-09-26 NOTE — Therapy (Signed)
OUTPATIENT PHYSICAL THERAPY NEURO TREATMENT   Patient Name: Jeanette Newton MRN: [id number] DOB:August 05, 1928, 89 y.o., female Today's Date: 09/27/2022   PCP: Dr. Emily Filbert REFERRING PROVIDER: Dr. Jennings Books  END OF SESSION:  PT End of Session - 09/27/22 0901     Visit Number 7    Number of Visits 24    Date for PT Re-Evaluation 11/29/22    Progress Note Due on Visit 10    PT Start Time 0905    PT Stop Time 0951    PT Time Calculation (min) 46 min    Equipment Utilized During Treatment Gait belt    Activity Tolerance Patient tolerated treatment well    Behavior During Therapy [facility] for tasks assessed/performed              Past Medical History:  Diagnosis Date   Cancer ([place]) 2005-2006   lymphoma   Hypothyroidism    Status post chemoradiation    lymphoma   Status post radiation therapy    Thyroid disease    Wears hearing aid in both ears    Past Surgical History:  Procedure Laterality Date   ABDOMINAL EXPLORATION SURGERY  2005-2006   Dr Pat Patrick   CATARACT EXTRACTION [facility] Left 07/28/2019   Procedure: CATARACT EXTRACTION PHACO AND INTRAOCULAR LENS PLACEMENT ([place]) LEFT 7.83  00:53.4;  Surgeon: Birder Robson, MD;  Location: [place];  Service: Ophthalmology;  Laterality: Left;   CATARACT EXTRACTION W/PHACO Right 08/25/2019   Procedure: CATARACT EXTRACTION PHACO AND INTRAOCULAR LENS PLACEMENT ([facility]) RIGHT 6.04 00:41.0;  Surgeon: Birder Robson, MD;  Location: [place];  Service: Ophthalmology;  Laterality: Right;   CHOLECYSTECTOMY N/A 05/25/2016   Procedure: LAPAROSCOPIC CHOLECYSTECTOMY WITH INTRAOPERATIVE CHOLANGIOGRAM;  Surgeon: Robert Bellow, MD;  Location: [facility];  Service: General;  Laterality: N/A;   ELBOW FRACTURE SURGERY Right    EYE SURGERY     LAPAROTOMY N/A 05/30/2019   Procedure: EXPLORATORY LAPAROTOMYand small bowel resection;  Surgeon: Jules Husbands, MD;  Location: [facility];  Service: General;  Laterality: N/A;   XI ROBOTIC  ASSISTED VENTRAL HERNIA N/A 11/06/2019   Procedure: XI ROBOTIC ASSISTED VENTRAL HERNIA REPAIR WITH MESH;  Surgeon: Jules Husbands, MD;  Location: [facility];  Service: General;  Laterality: N/A;   Patient Active Problem List   Diagnosis Date Noted   Bowel perforation ([place]) 05/30/2019   Calculus of gallbladder with acute cholecystitis without obstruction 05/15/2016    ONSET DATE: Over 6 months  REFERRING DIAG: R26.89 (ICD-10-CM) - Imbalance   THERAPY DIAG:  Difficulty in walking, not elsewhere classified  Muscle weakness (generalized)  Abnormality of gait and mobility  Other lack of coordination  Cognitive communication deficit  Rationale for Evaluation and Treatment: Rehabilitation  SUBJECTIVE:  SUBJECTIVE STATEMENT: Patient reports things have been "uneventful" since last visit- Denies any pain or falls.     Pt accompanied by: family member-[name]  PERTINENT HISTORY: Patient is a 89 year old female with Mixed Alzheimers and vascular dementia who lives alone yet daughter stays with her during the day. Reports does not drive and not happy about that yet has been independent with all ADLs- some imbalance however daughter denies any recent falls.   PAIN:  Are you having pain? No  PRECAUTIONS: Fall  WEIGHT BEARING RESTRICTIONS: No  FALLS: Has patient fallen in last 6 months? No  LIVING ENVIRONMENT: Lives with: lives alone and daughter is in and out and lives 2 mi away Lives in: House/apartment Stairs: Yes: Internal: 15 steps; on left going up Has following equipment at home: Single point cane, Walker - 2 wheeled, and Grab bars  PLOF: Independent with basic ADLs, Independent with gait, and Needs assistance with homemaking  PATIENT GOALS: Caregiver goal- is to improve her stability and keep  her safe in her home.2  OBJECTIVE:   DIAGNOSTIC FINDINGS:   Narrative & Impression  CLINICAL DATA:  Mixed dementia   EXAM: MRI HEAD WITHOUT CONTRAST   TECHNIQUE: Multiplanar, multiecho pulse sequences of the brain and surrounding structures were obtained without intravenous contrast.   COMPARISON:  None.   FINDINGS: Brain: No acute infarct, mass effect or extra-axial collection. No acute or chronic hemorrhage. There is multifocal hyperintense T2-weighted signal within the white matter. Generalized volume loss without a clear lobar predilection. The midline structures are normal. Old left cerebellar infarct.   Vascular: Major flow voids are preserved.   Skull and upper cervical spine: Normal calvarium and skull base. Visualized upper cervical spine and soft tissues are normal.   Sinuses/Orbits:No paranasal sinus fluid levels or advanced mucosal thickening. No mastoid or middle ear effusion. Normal orbits.   IMPRESSION: 1. No acute intracranial abnormality. 2. Findings of chronic small vessel ischemia and old left cerebellar infarct. 3. Generalized volume loss in a nonspecific pattern. Quantitative, volumetric MRI of the brain may be helpful for more specific evaluation for characteristic atrophy patterns associated with dementia.     Electronically Signed   By: Ulyses Jarred M.D.   On: 11/02/2020 23:42     COGNITION: Overall cognitive status: History of cognitive impairments - at baseline   SENSATION: WFL  COORDINATION: Slow to respond at times  EDEMA:  None observed  MUSCLE TONE: Normal throughout BUE/LE's   POSTURE: rounded shoulders and forward head  LOWER EXTREMITY ROM:     Active  Right Eval Left Eval  Hip flexion 3+ 3+  Hip extension 4 4  Hip abduction 4 4  Hip adduction 4 4  Hip internal rotation 4 4  Hip external rotation 4 4  Knee flexion 4 4  Knee extension 4 4  Ankle dorsiflexion 4 4  Ankle plantarflexion    Ankle inversion     Ankle eversion     (Blank rows = not tested)  LOWER EXTREMITY ROM  MMT Right Eval Left Eval  Hip flexion WNL  WNL  Hip extension WNL WNL  Hip abduction WNL WNL  Hip adduction WNL WNL  Hip internal rotation WNL WNL  Hip external rotation WNL WNL  Knee flexion WNL WNL  Knee extension WNL WNL  Ankle dorsiflexion WNL WNL  Ankle plantarflexion WNL WNL  Ankle inversion WNL WNL  Ankle eversion WNL WNL  (Blank rows = not tested)    TRANSFERS: Assistive device utilized:  None  Sit to stand: Complete Independence Stand to sit: Complete Independence Chair to chair: Complete Independence Floor:  not tested   GAIT: Gait pattern: decreased step length- Right, decreased step length- Left, and shuffling Distance walked: 150 feet Assistive device utilized: None Level of assistance: SBA Comments: Mild unsteadiness with 180 deg turning and shuffling gait noted  FUNCTIONAL TESTS:  5 times sit to stand: 18.89 sec without UE Support Timed up and go (TUG): 17.23 sec without an AD Berg Balance Scale: 45/56  PATIENT SURVEYS:  FOTO 56  TODAY'S TREATMENT:                                                                                                                              DATE:  09/27/2022   Therex:    Seated hip march 2 sets x 12 reps with 2.5# AW Seated knee ext- 2 sets x 12 reps with 2.5# AW Standing side step over 1/2 foam roll-  2.5 # AW x 15 reps   STS: 2x10 without UE support-  Min VC's for anterior weight shift coming into standing.   Forward step up with 2.5# AW x 8 each LE with single leg support then 8 more each LE without UE support.   Ambulation in clinic with 2.5# AW's - 300 feet- staggers some with CGA yet no LOB or complaint of excessive fatigue.  Ambulation in clinic without AW- 150 feet - continued staggged and VC to look ahead as patient with tendency to look down- No LOB.   Standing calf raises on 1/2 foam roll- x 15 reps BLE Standing toe raises on 1/2  foam roll x 15 reps BLE   Neuro Re-ed:   Standing on airex pad - dynamic march x 20 reps each. Mild unsteadiness with some UE reaching for railing.  Tandem or near tandem gait in // bars - down and back x 4 - Improved with each trial-started off just wide of line- but able to narrow up steps Heel to toe gait sequencing- using cones x 10 resp each LE - Difficulty with coordinating movement- better with Visual cues Forward walking in // Bars - counting how many steps required to walk from one end of bars to the other- x 6 trials- ranging from 6-7 steps each time.     Pt reliant on PT demo, min to mod multimodal cuing and visual targeting for successful carryover in exercises.             PATIENT EDUCATION: Education details: purpose of PT and how this service may assist in improving her balance Person educated: Patient, Child(ren), and [name] Education method: Explanation, Demonstration, Tactile cues, and Verbal cues Education comprehension: verbalized understanding, returned demonstration, verbal cues required, tactile cues required, and needs further education  HOME EXERCISE PROGRAM: Access Code: [email or web address] URL: [email or web address] Date: 09/11/2022 Prepared by: Sande Brothers  Exercises - Seated Hip Flexion March with Ankle Weights  - 1 x  daily - 3 x weekly - 3 sets - 10 reps - Seated Hip Abduction with Resistance  - 1 x daily - 3 x weekly - 3 sets - 10 reps - Seated Hip Adduction Isometrics with Ball  - 1 x daily - 3 x weekly - 3 sets - 10 reps - Seated Long Arc Quad with Ankle Weight  - 1 x daily - 3 x weekly - 3 sets - 10 reps - Seated Hamstring Curl with Anchored Resistance  - 1 x daily - 3 x weekly - 3 sets - 10 reps - Seated Heel Raise  - 1 x daily - 3 x weekly - 3 sets - 10 reps - Seated Heel Toe Raises  - 1 x daily - 3 x weekly - 3 sets - 10 reps - Sit to Stand with Arms Crossed  - 1 x daily - 3 x weekly - 3 sets - 10  reps  GOALS: Goals reviewed with patient? Yes  SHORT TERM GOALS: Target date: 10/18/2022  Pt and caregiver (daughter) will be independent with HEP in order to improve strength and balance in order to decrease fall risk and improve function at home and work Baseline: EVAL- Patient has no formal HEP and admits to not exercising.  Goal status: INITIAL   LONG TERM GOALS: Target date: 11/29/2022  Pt will improve FOTO to target score  of > 57 to display perceived improvements in ability to complete ADL's.  Baseline: EVAL= 56  Goal status: INITIAL  2.  Pt will decrease 5TSTS by at least 3 seconds in order to demonstrate clinically significant improvement in LE strength. Baseline: EVAL=18.89 sec without UE support Goal status: INITIAL  3.  Pt will decrease TUG to below 14 seconds/decrease in order to demonstrate decreased fall risk.  Baseline: EVAL= 17.23 sec without UE support Goal status: INITIAL  4.  Pt will improve BERG by at least 3 points in order to demonstrate clinically significant improvement in balance.  Baseline: EVAL=45/56 Goal status: INITIAL  5.  Patient will deny any falls during episode of care for optimal safety with all household and community distances  Baseline: EVAL= Unsteady yet no reported falls in last 6 months.  Goal status: INITIAL    ASSESSMENT:  CLINICAL IMPRESSION: Treatment continued with focus on LE strengthening and balance with patient participating well today. She was most responsive with repetitive Verbal and visual cues for best return of demonstration. She progressed to more standing LE strengthening with fatigue as only limiting factor. She required most cues to look ahead with gait for safety but no significant difference with resistance vs. No resistive gait.  Pt will continue to benefit from skilled PT services to address balance impairments to reduce falls risk.     OBJECTIVE IMPAIRMENTS: Abnormal gait, decreased balance, decreased  cognition, decreased coordination, decreased mobility, difficulty walking, and decreased strength.   ACTIVITY LIMITATIONS: carrying, lifting, and stairs  PARTICIPATION LIMITATIONS: cleaning, laundry, shopping, community activity, and yard work  PERSONAL FACTORS: 1 comorbidity: dementia  are also affecting patient's functional outcome.   REHAB POTENTIAL: Good  CLINICAL DECISION MAKING: Evolving/moderate complexity  EVALUATION COMPLEXITY: Low  PLAN:  PT FREQUENCY: 1-2x/week  PT DURATION: 12 weeks  PLANNED INTERVENTIONS: Therapeutic exercises, Therapeutic activity, Neuromuscular re-education, Balance training, Gait training, Patient/Family education, Self Care, Joint mobilization, Stair training, Vestibular training, Canalith repositioning, DME instructions, Cryotherapy, Moist heat, and Manual therapy  PLAN FOR NEXT SESSION: Continue with LE strengthening and balance training as appropriate.  Ollen Bowl, PT Physical Therapist- [facility]  09/27/2022, 9:58 AM

## 2022-09-27 ENCOUNTER — Encounter: Payer: Medicare Other | Admitting: Speech Pathology

## 2022-09-27 ENCOUNTER — Ambulatory Visit: Payer: Medicare Other | Attending: Neurology

## 2022-09-27 DIAGNOSIS — R262 Difficulty in walking, not elsewhere classified: Secondary | ICD-10-CM | POA: Insufficient documentation

## 2022-09-27 DIAGNOSIS — R278 Other lack of coordination: Secondary | ICD-10-CM | POA: Insufficient documentation

## 2022-09-27 DIAGNOSIS — R41841 Cognitive communication deficit: Secondary | ICD-10-CM | POA: Insufficient documentation

## 2022-09-27 DIAGNOSIS — M6281 Muscle weakness (generalized): Secondary | ICD-10-CM | POA: Insufficient documentation

## 2022-09-27 DIAGNOSIS — R269 Unspecified abnormalities of gait and mobility: Secondary | ICD-10-CM | POA: Diagnosis present

## 2022-10-02 ENCOUNTER — Ambulatory Visit: Payer: Medicare Other | Admitting: Speech Pathology

## 2022-10-02 ENCOUNTER — Ambulatory Visit: Payer: Medicare Other

## 2022-10-02 DIAGNOSIS — R278 Other lack of coordination: Secondary | ICD-10-CM

## 2022-10-02 DIAGNOSIS — R269 Unspecified abnormalities of gait and mobility: Secondary | ICD-10-CM

## 2022-10-02 DIAGNOSIS — M6281 Muscle weakness (generalized): Secondary | ICD-10-CM

## 2022-10-02 DIAGNOSIS — R262 Difficulty in walking, not elsewhere classified: Secondary | ICD-10-CM

## 2022-10-02 NOTE — Therapy (Signed)
OUTPATIENT PHYSICAL THERAPY NEURO TREATMENT   Patient Name: Jeanette Newton MRN: [id number] DOB:[birth year], 89 y.o., female Today's Date: 10/03/2022   PCP: Dr. Emily Filbert REFERRING PROVIDER: Dr. Jennings Books  END OF SESSION:  PT End of Session - 10/02/22 0910     Visit Number 8    Number of Visits 24    Date for PT Re-Evaluation 11/29/22    Progress Note Due on Visit 10    PT Start Time 0910    PT Stop Time 0952    PT Time Calculation (min) 42 min    Equipment Utilized During Treatment Gait belt    Activity Tolerance Patient tolerated treatment well    Behavior During Therapy [facility] for tasks assessed/performed              Past Medical History:  Diagnosis Date   Cancer ([place]) 2005-2006   lymphoma   Hypothyroidism    Status post chemoradiation    lymphoma   Status post radiation therapy    Thyroid disease    Wears hearing aid in both ears    Past Surgical History:  Procedure Laterality Date   ABDOMINAL EXPLORATION SURGERY  2005-2006   Dr Pat Patrick   CATARACT EXTRACTION [facility] Left 07/28/2019   Procedure: CATARACT EXTRACTION PHACO AND INTRAOCULAR LENS PLACEMENT ([place]) LEFT 7.83  00:53.4;  Surgeon: Birder Robson, MD;  Location: [place];  Service: Ophthalmology;  Laterality: Left;   CATARACT EXTRACTION W/PHACO Right 08/25/2019   Procedure: CATARACT EXTRACTION PHACO AND INTRAOCULAR LENS PLACEMENT ([facility]) RIGHT 6.04 00:41.0;  Surgeon: Birder Robson, MD;  Location: [place];  Service: Ophthalmology;  Laterality: Right;   CHOLECYSTECTOMY N/A 05/25/2016   Procedure: LAPAROSCOPIC CHOLECYSTECTOMY WITH INTRAOPERATIVE CHOLANGIOGRAM;  Surgeon: Robert Bellow, MD;  Location: [facility];  Service: General;  Laterality: N/A;   ELBOW FRACTURE SURGERY Right    EYE SURGERY     LAPAROTOMY N/A 05/30/2019   Procedure: EXPLORATORY LAPAROTOMYand small bowel resection;  Surgeon: Jules Husbands, MD;  Location: [facility];  Service: General;  Laterality: N/A;   XI ROBOTIC  ASSISTED VENTRAL HERNIA N/A 11/06/2019   Procedure: XI ROBOTIC ASSISTED VENTRAL HERNIA REPAIR WITH MESH;  Surgeon: Jules Husbands, MD;  Location: [facility];  Service: General;  Laterality: N/A;   Patient Active Problem List   Diagnosis Date Noted   Bowel perforation ([place]) 05/30/2019   Calculus of gallbladder with acute cholecystitis without obstruction 05/15/2016    ONSET DATE: Over 6 months  REFERRING DIAG: R26.89 (ICD-10-CM) - Imbalance   THERAPY DIAG:  Difficulty in walking, not elsewhere classified  Muscle weakness (generalized)  Abnormality of gait and mobility  Other lack of coordination  Rationale for Evaluation and Treatment: Rehabilitation  SUBJECTIVE:  SUBJECTIVE STATEMENT: Patient reports no changes or issues. Dtr present and reports the patient "could feel it" from exercising last session.  Pt accompanied by: family member-[name]  PERTINENT HISTORY: Patient is a 89 year old female with Mixed Alzheimers and vascular dementia who lives alone yet daughter stays with her during the day. Reports does not drive and not happy about that yet has been independent with all ADLs- some imbalance however daughter denies any recent falls.   PAIN:  Are you having pain? No  PRECAUTIONS: Fall  WEIGHT BEARING RESTRICTIONS: No  FALLS: Has patient fallen in last 6 months? No  LIVING ENVIRONMENT: Lives with: lives alone and daughter is in and out and lives 2 mi away Lives in: House/apartment Stairs: Yes: Internal: 15 steps; on left going up Has following equipment at home: Single point cane, Walker - 2 wheeled, and Grab bars  PLOF: Independent with basic ADLs, Independent with gait, and Needs assistance with homemaking  PATIENT GOALS: Caregiver goal- is to improve her stability and keep her safe in her home.2  OBJECTIVE:    DIAGNOSTIC FINDINGS:   Narrative & Impression  CLINICAL DATA:  Mixed dementia   EXAM: MRI HEAD WITHOUT CONTRAST   TECHNIQUE: Multiplanar, multiecho pulse sequences of the brain and surrounding structures were obtained without intravenous contrast.   COMPARISON:  None.   FINDINGS: Brain: No acute infarct, mass effect or extra-axial collection. No acute or chronic hemorrhage. There is multifocal hyperintense T2-weighted signal within the white matter. Generalized volume loss without a clear lobar predilection. The midline structures are normal. Old left cerebellar infarct.   Vascular: Major flow voids are preserved.   Skull and upper cervical spine: Normal calvarium and skull base. Visualized upper cervical spine and soft tissues are normal.   Sinuses/Orbits:No paranasal sinus fluid levels or advanced mucosal thickening. No mastoid or middle ear effusion. Normal orbits.   IMPRESSION: 1. No acute intracranial abnormality. 2. Findings of chronic small vessel ischemia and old left cerebellar infarct. 3. Generalized volume loss in a nonspecific pattern. Quantitative, volumetric MRI of the brain may be helpful for more specific evaluation for characteristic atrophy patterns associated with dementia.     Electronically Signed   By: Ulyses Jarred M.D.   On: 11/02/2020 23:42     COGNITION: Overall cognitive status: History of cognitive impairments - at baseline   SENSATION: WFL  COORDINATION: Slow to respond at times  EDEMA:  None observed  MUSCLE TONE: Normal throughout BUE/LE's   POSTURE: rounded shoulders and forward head  LOWER EXTREMITY ROM:     Active  Right Eval Left Eval  Hip flexion 3+ 3+  Hip extension 4 4  Hip abduction 4 4  Hip adduction 4 4  Hip internal rotation 4 4  Hip external rotation 4 4  Knee flexion 4 4  Knee extension 4 4  Ankle dorsiflexion 4 4  Ankle plantarflexion    Ankle inversion    Ankle eversion     (Blank rows =  not tested)  LOWER EXTREMITY ROM  MMT Right Eval Left Eval  Hip flexion WNL  WNL  Hip extension WNL WNL  Hip abduction WNL WNL  Hip adduction WNL WNL  Hip internal rotation WNL WNL  Hip external rotation WNL WNL  Knee flexion WNL WNL  Knee extension WNL WNL  Ankle dorsiflexion WNL WNL  Ankle plantarflexion WNL WNL  Ankle inversion WNL WNL  Ankle eversion WNL WNL  (Blank rows = not tested)    TRANSFERS: Assistive device utilized: None  Sit to stand: Complete Independence Stand to sit: Complete Independence Chair to chair: Complete Independence Floor:  not  tested   GAIT: Gait pattern: decreased step length- Right, decreased step length- Left, and shuffling Distance walked: 150 feet Assistive device utilized: None Level of assistance: SBA Comments: Mild unsteadiness with 180 deg turning and shuffling gait noted  FUNCTIONAL TESTS:  5 times sit to stand: 18.89 sec without UE Support Timed up and go (TUG): 17.23 sec without an AD Berg Balance Scale: 45/56  PATIENT SURVEYS:  FOTO 56  TODAY'S TREATMENT:                                                                                                                              DATE:  10/02/2022   Therex:     STS: 2x10 without UE support-  Min VC's for anterior weight shift coming into standing.   Forward step up x 15 reps each LE (VC for sequencing) without UE support.   Neuro Re-ed:   Activity Description: 6 pods positioned (3 on tray table for UE tap and 3 on floor for LE tap) - The Blaze Pod Random setting was chosen to enhance cognitive processing and agility, providing an unpredictable environment to simulate real-world scenarios, and fostering quick reactions and adaptability.   Activity Setting:  Random Number of Pods:  6 Cycles/Sets:  6 Duration (Time or Hit Count):  10  Patient Stats  Reaction Time:  1) 25.22 2) 21.87 3) 19.98 4) 20.74 5)19.23 sec   Activity Description: 6 pods positioned on floor  approx 3 feet apart -Patient instructed to walk forward/backward/diagonal/side step to touch appropriate colored pod Activity Setting:  focus- The [place] setting was selected to refine precision and concentration, isolating specific muscle groups or movements to enhance overall coordination and targeted muscle engagement. Number of Pods:  6 Cycles/Sets:  5 Duration (Time or Hit Count):  30 sec  Patient Stats  Hits:  1) 5 2) 7 3) 8 4) 8 5) 9    Cone-tap- on appropriately called out color- standing in // bars with 3 different colored cones and patient had to initially tap 15 times with right LE then 15 x left LE- Slightly longer with right LE with decreased stance on left LE  Ladder drill- forward stepping in // bars without UE support - x 5 down and back Ladder drill- Forward then retro in // bars without UE support x 5 down and back Ladder drill- Side stepping  in // bars without UE support x 5 down and back   Standing on airex pad - dynamic march x 20 reps each. Mild unsteadiness with some UE reaching for railing.    Pt reliant on PT demo, min to mod multimodal cuing and visual targeting for successful carryover in exercises.             PATIENT EDUCATION: Education details: purpose of PT and how this service may assist in improving her balance Person educated: Patient, Child(ren), and [name] Education method: Explanation, Demonstration, Tactile cues, and  Verbal cues Education comprehension: verbalized understanding, returned demonstration, verbal cues required, tactile cues required, and needs further education  HOME EXERCISE PROGRAM: Access Code: [email or web address] URL: [email or web address] Date: 09/11/2022 Prepared by: Sande Brothers  Exercises - Seated Hip Flexion March with Ankle Weights  - 1 x daily - 3 x weekly - 3 sets - 10 reps - Seated Hip Abduction with Resistance  - 1 x daily - 3 x weekly - 3 sets - 10 reps - Seated Hip Adduction  Isometrics with Ball  - 1 x daily - 3 x weekly - 3 sets - 10 reps - Seated Long Arc Quad with Ankle Weight  - 1 x daily - 3 x weekly - 3 sets - 10 reps - Seated Hamstring Curl with Anchored Resistance  - 1 x daily - 3 x weekly - 3 sets - 10 reps - Seated Heel Raise  - 1 x daily - 3 x weekly - 3 sets - 10 reps - Seated Heel Toe Raises  - 1 x daily - 3 x weekly - 3 sets - 10 reps - Sit to Stand with Arms Crossed  - 1 x daily - 3 x weekly - 3 sets - 10 reps  GOALS: Goals reviewed with patient? Yes  SHORT TERM GOALS: Target date: 10/18/2022  Pt and caregiver (daughter) will be independent with HEP in order to improve strength and balance in order to decrease fall risk and improve function at home and work Baseline: EVAL- Patient has no formal HEP and admits to not exercising.  Goal status: INITIAL   LONG TERM GOALS: Target date: 11/29/2022  Pt will improve FOTO to target score  of > 57 to display perceived improvements in ability to complete ADL's.  Baseline: EVAL= 56  Goal status: INITIAL  2.  Pt will decrease 5TSTS by at least 3 seconds in order to demonstrate clinically significant improvement in LE strength. Baseline: EVAL=18.89 sec without UE support Goal status: INITIAL  3.  Pt will decrease TUG to below 14 seconds/decrease in order to demonstrate decreased fall risk.  Baseline: EVAL= 17.23 sec without UE support Goal status: INITIAL  4.  Pt will improve BERG by at least 3 points in order to demonstrate clinically significant improvement in balance.  Baseline: EVAL=45/56 Goal status: INITIAL  5.  Patient will deny any falls during episode of care for optimal safety with all household and community distances  Baseline: EVAL= Unsteady yet no reported falls in last 6 months.  Goal status: INITIAL    ASSESSMENT:  CLINICAL IMPRESSION: Continued today with plan to focus on coordination and balance- Patient responded favorably to Blaze pod activities provided increased VC for  direction and task. The patient demonstrated significant progress while utilizing [employer], showcasing improved coordination, balance, reaction time, and cognitive function. The incorporation of dual-tasking technology with color recognition and association with specific movements in Blaze Pods was strategically chosen to provide a dynamic training environment, enabling the patient to engage in simultaneous physical and cognitive tasks. This unique approach enhances not only their physical abilities but also fosters increased neural connectivity and mental awareness, contributing to a well-rounded and effective rehabilitation and training experience.      Pt will continue to benefit from skilled PT services to address balance impairments to reduce falls risk.     OBJECTIVE IMPAIRMENTS: Abnormal gait, decreased balance, decreased cognition, decreased coordination, decreased mobility, difficulty walking, and decreased strength.   ACTIVITY LIMITATIONS: carrying, lifting, and stairs  PARTICIPATION LIMITATIONS: cleaning, laundry, shopping,  community activity, and yard work  PERSONAL FACTORS: 1 comorbidity: dementia  are also affecting patient's functional outcome.   REHAB POTENTIAL: Good  CLINICAL DECISION MAKING: Evolving/moderate complexity  EVALUATION COMPLEXITY: Low  PLAN:  PT FREQUENCY: 1-2x/week  PT DURATION: 12 weeks  PLANNED INTERVENTIONS: Therapeutic exercises, Therapeutic activity, Neuromuscular re-education, Balance training, Gait training, Patient/Family education, Self Care, Joint mobilization, Stair training, Vestibular training, Canalith repositioning, DME instructions, Cryotherapy, Moist heat, and Manual therapy  PLAN FOR NEXT SESSION: Continue with LE strengthening and balance training as appropriate.     Ollen Bowl, PT Physical Therapist- [facility]  10/03/2022, 11:15 AM

## 2022-10-03 ENCOUNTER — Other Ambulatory Visit: Payer: Medicare Other

## 2022-10-03 VITALS — BP 142/60 | HR 53 | Temp 98.0°F | Wt 133.8 lb

## 2022-10-03 DIAGNOSIS — Z515 Encounter for palliative care: Secondary | ICD-10-CM

## 2022-10-03 NOTE — Progress Notes (Signed)
PATIENT NAME: Jeanette Newton DOB: Nov 19, 1927 MRN: [id number]  PRIMARY CARE PROVIDER: Rusty Aus, MD  RESPONSIBLE PARTY:  Acct ID - Guarantor Home Phone Work Phone Relationship Acct Type  [id number] - Woehler,ER* [phone]  Self P/F     [address] [place], [place], [place] [address]   Home visit completed with patient and daughter [name].  ACP:  Reviewed DNR status and MOST form.  Daughter has blank MOST form and will review and fill out with PCP.  Living will and HCPOA are in place.   Dementia:  Daughter does not feel like there has been any significant changes in patient.  She has been much more active since starting PT.  Patient reports fatigue on days with PT and often is ready for bed around 7 pm.    Edema:  Patient 1-2+ pitting ankle edema.  Only taking dyazide as needed.  Daughter will administer dyazide today and continue to monitor lower extremities.   Outpatient PT:  Patient has started with outpatient PT since my last visit.  Patient and daughter feel this has been very helpful and are seeing improvement.   Dementia Resource Booklet:  Booklet provided to daughter [name] today.  Discussed purpose of book and resources.  Daughter will review.  Weight:  Obtained weight today.  She is 133.8 lbs and remains stable.  Daughter has not started mirtazapine due to side effects and feels patient's weight has stabilized.  I will continue to check weight periodically and update daughter on any changes.   Appetite remains unchanged.     CODE STATUS: DNR ADVANCED DIRECTIVES: Yes MOST FORM: No PPS: 50%   PHYSICAL EXAM:   VITALS: Today's Vitals   10/03/22 0944  BP: (!) 142/60  Pulse: (!) 53  Temp: 98 F (36.7 C)  SpO2: 95%  Weight: 133 lb 12.8 oz (60.7 kg)    LUNGS: clear to auscultation  CARDIAC: Cor RRR}  EXTREMITIES:  SKIN: Skin color, texture, turgor normal. No rashes or lesions or mobility and turgor normal  NEURO: positive for gait problems and memory  problems       Lorenza Burton, RN

## 2022-10-04 ENCOUNTER — Encounter: Payer: Medicare Other | Admitting: Speech Pathology

## 2022-10-04 ENCOUNTER — Ambulatory Visit: Payer: Medicare Other

## 2022-10-04 DIAGNOSIS — R269 Unspecified abnormalities of gait and mobility: Secondary | ICD-10-CM

## 2022-10-04 DIAGNOSIS — R262 Difficulty in walking, not elsewhere classified: Secondary | ICD-10-CM

## 2022-10-04 DIAGNOSIS — R41841 Cognitive communication deficit: Secondary | ICD-10-CM

## 2022-10-04 DIAGNOSIS — R278 Other lack of coordination: Secondary | ICD-10-CM

## 2022-10-04 DIAGNOSIS — M6281 Muscle weakness (generalized): Secondary | ICD-10-CM

## 2022-10-04 NOTE — Therapy (Signed)
OUTPATIENT PHYSICAL THERAPY NEURO TREATMENT   Patient Name: Jeanette Newton MRN: [id number] DOB:Jul 24, 1928, 89 y.o., female Today's Date: 10/04/2022   PCP: Dr. Emily Filbert REFERRING PROVIDER: Dr. Jennings Books  END OF SESSION:  PT End of Session - 10/04/22 0925     Visit Number 9    Number of Visits 24    Date for PT Re-Evaluation 11/29/22    Progress Note Due on Visit 10    PT Start Time 0930    PT Stop Time 1004    PT Time Calculation (min) 34 min    Equipment Utilized During Treatment Gait belt    Activity Tolerance Patient tolerated treatment well    Behavior During Therapy [facility] for tasks assessed/performed              Past Medical History:  Diagnosis Date   Cancer ([place]) 2005-2006   lymphoma   Hypothyroidism    Status post chemoradiation    lymphoma   Status post radiation therapy    Thyroid disease    Wears hearing aid in both ears    Past Surgical History:  Procedure Laterality Date   ABDOMINAL EXPLORATION SURGERY  2005-2006   Dr Pat Patrick   CATARACT EXTRACTION [facility] Left 07/28/2019   Procedure: CATARACT EXTRACTION PHACO AND INTRAOCULAR LENS PLACEMENT ([place]) LEFT 7.83  00:53.4;  Surgeon: Birder Robson, MD;  Location: [place];  Service: Ophthalmology;  Laterality: Left;   CATARACT EXTRACTION W/PHACO Right 08/25/2019   Procedure: CATARACT EXTRACTION PHACO AND INTRAOCULAR LENS PLACEMENT ([facility]) RIGHT 6.04 00:41.0;  Surgeon: Birder Robson, MD;  Location: [place];  Service: Ophthalmology;  Laterality: Right;   CHOLECYSTECTOMY N/A 05/25/2016   Procedure: LAPAROSCOPIC CHOLECYSTECTOMY WITH INTRAOPERATIVE CHOLANGIOGRAM;  Surgeon: Robert Bellow, MD;  Location: [facility];  Service: General;  Laterality: N/A;   ELBOW FRACTURE SURGERY Right    EYE SURGERY     LAPAROTOMY N/A 05/30/2019   Procedure: EXPLORATORY LAPAROTOMYand small bowel resection;  Surgeon: Jules Husbands, MD;  Location: [facility];  Service: General;  Laterality: N/A;   XI ROBOTIC  ASSISTED VENTRAL HERNIA N/A 11/06/2019   Procedure: XI ROBOTIC ASSISTED VENTRAL HERNIA REPAIR WITH MESH;  Surgeon: Jules Husbands, MD;  Location: [facility];  Service: General;  Laterality: N/A;   Patient Active Problem List   Diagnosis Date Noted   Bowel perforation ([place]) 05/30/2019   Calculus of gallbladder with acute cholecystitis without obstruction 05/15/2016    ONSET DATE: Over 6 months  REFERRING DIAG: R26.89 (ICD-10-CM) - Imbalance   THERAPY DIAG:  Difficulty in walking, not elsewhere classified  Muscle weakness (generalized)  Abnormality of gait and mobility  Other lack of coordination  Cognitive communication deficit  Rationale for Evaluation and Treatment: Rehabilitation  SUBJECTIVE:  SUBJECTIVE STATEMENT: Patient reports doing well overall and no issues- denies any soreness from last visit and no pain or falls.  Pt accompanied by: family member-[name]  PERTINENT HISTORY: Patient is a 89 year old female with Mixed Alzheimers and vascular dementia who lives alone yet daughter stays with her during the day. Reports does not drive and not happy about that yet has been independent with all ADLs- some imbalance however daughter denies any recent falls.   PAIN:  Are you having pain? No  PRECAUTIONS: Fall  WEIGHT BEARING RESTRICTIONS: No  FALLS: Has patient fallen in last 6 months? No  LIVING ENVIRONMENT: Lives with: lives alone and daughter is in and out and lives 2 mi away Lives in: House/apartment Stairs: Yes: Internal: 15 steps; on left going up Has following equipment at home: Single point cane, Walker - 2 wheeled, and Grab bars  PLOF: Independent with basic ADLs, Independent with gait, and Needs assistance with homemaking  PATIENT GOALS: Caregiver goal- is to improve her stability and keep her safe in her home.2  OBJECTIVE:    DIAGNOSTIC FINDINGS:   Narrative & Impression  CLINICAL DATA:  Mixed dementia   EXAM: MRI HEAD WITHOUT CONTRAST   TECHNIQUE: Multiplanar, multiecho pulse sequences of the brain and surrounding structures were obtained without intravenous contrast.   COMPARISON:  None.   FINDINGS: Brain: No acute infarct, mass effect or extra-axial collection. No acute or chronic hemorrhage. There is multifocal hyperintense T2-weighted signal within the white matter. Generalized volume loss without a clear lobar predilection. The midline structures are normal. Old left cerebellar infarct.   Vascular: Major flow voids are preserved.   Skull and upper cervical spine: Normal calvarium and skull base. Visualized upper cervical spine and soft tissues are normal.   Sinuses/Orbits:No paranasal sinus fluid levels or advanced mucosal thickening. No mastoid or middle ear effusion. Normal orbits.   IMPRESSION: 1. No acute intracranial abnormality. 2. Findings of chronic small vessel ischemia and old left cerebellar infarct. 3. Generalized volume loss in a nonspecific pattern. Quantitative, volumetric MRI of the brain may be helpful for more specific evaluation for characteristic atrophy patterns associated with dementia.     Electronically Signed   By: Ulyses Jarred M.D.   On: 11/02/2020 23:42     COGNITION: Overall cognitive status: History of cognitive impairments - at baseline   SENSATION: WFL  COORDINATION: Slow to respond at times  EDEMA:  None observed  MUSCLE TONE: Normal throughout BUE/LE's   POSTURE: rounded shoulders and forward head  LOWER EXTREMITY ROM:     Active  Right Eval Left Eval  Hip flexion 3+ 3+  Hip extension 4 4  Hip abduction 4 4  Hip adduction 4 4  Hip internal rotation 4 4  Hip external rotation 4 4  Knee flexion 4 4  Knee extension 4 4  Ankle dorsiflexion 4 4  Ankle plantarflexion    Ankle inversion    Ankle eversion     (Blank rows =  not tested)  LOWER EXTREMITY ROM  MMT Right Eval Left Eval  Hip flexion WNL  WNL  Hip extension WNL WNL  Hip abduction WNL WNL  Hip adduction WNL WNL  Hip internal rotation WNL WNL  Hip external rotation WNL WNL  Knee flexion WNL WNL  Knee extension WNL WNL  Ankle dorsiflexion WNL WNL  Ankle plantarflexion WNL WNL  Ankle inversion WNL WNL  Ankle eversion WNL WNL  (Blank rows = not tested)    TRANSFERS: Assistive device utilized: None  Sit to stand: Complete Independence Stand to sit: Complete Independence Chair to chair: Complete Independence Floor:  not  tested   GAIT: Gait pattern: decreased step length- Right, decreased step length- Left, and shuffling Distance walked: 150 feet Assistive device utilized: None Level of assistance: SBA Comments: Mild unsteadiness with 180 deg turning and shuffling gait noted  FUNCTIONAL TESTS:  5 times sit to stand: 18.89 sec without UE Support Timed up and go (TUG): 17.23 sec without an AD Berg Balance Scale: 45/56  PATIENT SURVEYS:  FOTO 56  TODAY'S TREATMENT:                                                                                                                              DATE:  10/04/2022   Therex:     STS: with 1 LE on airex pad x10 without UE support each LE (20 total STS)   Forward step up x 15 reps each LE (VC for sequencing) with UE support with 2.5# AW Side step up then down on opp side then back up and return to start with 2.5 # AW x 15 reps each LE  Forward lunges- 2.5# AW x 12 reps each LE   Calf raises 2.5# AW from 1/2 foam-2 sets of 12 reps  Toe raises 2.5# AW from 1/2 foam-2 sets of 12 reps  Deferred gait with resistance today as patient's daughter reported that they were going to [facility] immediately following session.         Pt reliant on PT demo, min to mod multimodal cuing and visual targeting for successful carryover in exercises.             PATIENT EDUCATION: Education  details: purpose of PT and how this service may assist in improving her balance Person educated: Patient, Child(ren), and [name] Education method: Explanation, Demonstration, Tactile cues, and Verbal cues Education comprehension: verbalized understanding, returned demonstration, verbal cues required, tactile cues required, and needs further education  HOME EXERCISE PROGRAM: Access Code: [email or web address] URL: [email or web address] Date: 09/11/2022 Prepared by: Sande Brothers  Exercises - Seated Hip Flexion March with Ankle Weights  - 1 x daily - 3 x weekly - 3 sets - 10 reps - Seated Hip Abduction with Resistance  - 1 x daily - 3 x weekly - 3 sets - 10 reps - Seated Hip Adduction Isometrics with Ball  - 1 x daily - 3 x weekly - 3 sets - 10 reps - Seated Long Arc Quad with Ankle Weight  - 1 x daily - 3 x weekly - 3 sets - 10 reps - Seated Hamstring Curl with Anchored Resistance  - 1 x daily - 3 x weekly - 3 sets - 10 reps - Seated Heel Raise  - 1 x daily - 3 x weekly - 3 sets - 10 reps - Seated Heel Toe Raises  - 1 x daily - 3 x weekly - 3 sets - 10 reps - Sit to Stand with Arms Crossed  - 1 x daily - 3 x weekly - 3 sets -  10 reps  GOALS: Goals reviewed with patient? Yes  SHORT TERM GOALS: Target date: 10/18/2022  Pt and caregiver (daughter) will be independent with HEP in order to improve strength and balance in order to decrease fall risk and improve function at home and work Baseline: EVAL- Patient has no formal HEP and admits to not exercising.  Goal status: INITIAL   LONG TERM GOALS: Target date: 11/29/2022  Pt will improve FOTO to target score  of > 57 to display perceived improvements in ability to complete ADL's.  Baseline: EVAL= 56  Goal status: INITIAL  2.  Pt will decrease 5TSTS by at least 3 seconds in order to demonstrate clinically significant improvement in LE strength. Baseline: EVAL=18.89 sec without UE support Goal status: INITIAL  3.  Pt will  decrease TUG to below 14 seconds/decrease in order to demonstrate decreased fall risk.  Baseline: EVAL= 17.23 sec without UE support Goal status: INITIAL  4.  Pt will improve BERG by at least 3 points in order to demonstrate clinically significant improvement in balance.  Baseline: EVAL=45/56 Goal status: INITIAL  5.  Patient will deny any falls during episode of care for optimal safety with all household and community distances  Baseline: EVAL= Unsteady yet no reported falls in last 6 months.  Goal status: INITIAL    ASSESSMENT:  CLINICAL IMPRESSION: Patient presented with good motivation today- responding well to all VC and visual demo for performing therex correctly. Patient presented with some fatigue and some Left sided weakness vs right yet able to complete all reps today without any significant issues. She has some impaired cognition and requires close supervision with all activities.  Pt will continue to benefit from skilled PT services to address balance impairments to reduce falls risk.     OBJECTIVE IMPAIRMENTS: Abnormal gait, decreased balance, decreased cognition, decreased coordination, decreased mobility, difficulty walking, and decreased strength.   ACTIVITY LIMITATIONS: carrying, lifting, and stairs  PARTICIPATION LIMITATIONS: cleaning, laundry, shopping, community activity, and yard work  PERSONAL FACTORS: 1 comorbidity: dementia  are also affecting patient's functional outcome.   REHAB POTENTIAL: Good  CLINICAL DECISION MAKING: Evolving/moderate complexity  EVALUATION COMPLEXITY: Low  PLAN:  PT FREQUENCY: 1-2x/week  PT DURATION: 12 weeks  PLANNED INTERVENTIONS: Therapeutic exercises, Therapeutic activity, Neuromuscular re-education, Balance training, Gait training, Patient/Family education, Self Care, Joint mobilization, Stair training, Vestibular training, Canalith repositioning, DME instructions, Cryotherapy, Moist heat, and Manual therapy  PLAN FOR  NEXT SESSION: Continue with LE strengthening and balance training as appropriate.     Ollen Bowl, PT Physical Therapist- [facility]  10/04/2022, 10:08 AM

## 2022-10-08 NOTE — Therapy (Signed)
OUTPATIENT PHYSICAL THERAPY NEURO TREATMENT/Physical Therapy Progress Note   Dates of reporting period  09/06/2022   to   10/09/2022    Patient Name: Jeanette Newton MRN: [id number] DOB:1927-12-03, 89 y.o., female Today's Date: 10/04/2022   PCP: Dr. Emily Filbert REFERRING PROVIDER: Dr. Jennings Books  END OF SESSION:  PT End of Session - 10/04/22 0925     Visit Number 9    Number of Visits 24    Date for PT Re-Evaluation 11/29/22    Progress Note Due on Visit 10    PT Start Time 0930    PT Stop Time 1004    PT Time Calculation (min) 34 min    Equipment Utilized During Treatment Gait belt    Activity Tolerance Patient tolerated treatment well    Behavior During Therapy [facility] for tasks assessed/performed              Past Medical History:  Diagnosis Date   Cancer ([place]) 2005-2006   lymphoma   Hypothyroidism    Status post chemoradiation    lymphoma   Status post radiation therapy    Thyroid disease    Wears hearing aid in both ears    Past Surgical History:  Procedure Laterality Date   ABDOMINAL EXPLORATION SURGERY  2005-2006   Dr Pat Patrick   CATARACT EXTRACTION [facility] Left 07/28/2019   Procedure: CATARACT EXTRACTION PHACO AND INTRAOCULAR LENS PLACEMENT ([place]) LEFT 7.83  00:53.4;  Surgeon: Birder Robson, MD;  Location: [place];  Service: Ophthalmology;  Laterality: Left;   CATARACT EXTRACTION W/PHACO Right 08/25/2019   Procedure: CATARACT EXTRACTION PHACO AND INTRAOCULAR LENS PLACEMENT ([facility]) RIGHT 6.04 00:41.0;  Surgeon: Birder Robson, MD;  Location: [place];  Service: Ophthalmology;  Laterality: Right;   CHOLECYSTECTOMY N/A 05/25/2016   Procedure: LAPAROSCOPIC CHOLECYSTECTOMY WITH INTRAOPERATIVE CHOLANGIOGRAM;  Surgeon: Robert Bellow, MD;  Location: [facility];  Service: General;  Laterality: N/A;   ELBOW FRACTURE SURGERY Right    EYE SURGERY     LAPAROTOMY N/A 05/30/2019   Procedure: EXPLORATORY LAPAROTOMYand small bowel resection;  Surgeon:  Jules Husbands, MD;  Location: [facility];  Service: General;  Laterality: N/A;   XI ROBOTIC ASSISTED VENTRAL HERNIA N/A 11/06/2019   Procedure: XI ROBOTIC ASSISTED VENTRAL HERNIA REPAIR WITH MESH;  Surgeon: Jules Husbands, MD;  Location: [facility];  Service: General;  Laterality: N/A;   Patient Active Problem List   Diagnosis Date Noted   Bowel perforation ([place]) 05/30/2019   Calculus of gallbladder with acute cholecystitis without obstruction 05/15/2016    ONSET DATE: Over 6 months  REFERRING DIAG: R26.89 (ICD-10-CM) - Imbalance   THERAPY DIAG:  Difficulty in walking, not elsewhere classified  Muscle weakness (generalized)  Abnormality of gait and mobility  Other lack of coordination  Cognitive communication deficit  Rationale for Evaluation and Treatment: Rehabilitation  SUBJECTIVE:  SUBJECTIVE STATEMENT: ***  Pt accompanied by: family member-[name]  PERTINENT HISTORY: Patient is a 89 year old female with Mixed Alzheimers and vascular dementia who lives alone yet daughter stays with her during the day. Reports does not drive and not happy about that yet has been independent with all ADLs- some imbalance however daughter denies any recent falls.   PAIN:  Are you having pain? No  PRECAUTIONS: Fall  WEIGHT BEARING RESTRICTIONS: No  FALLS: Has patient fallen in last 6 months? No  LIVING ENVIRONMENT: Lives with: lives alone and daughter is in and out and lives 2 mi away Lives in: House/apartment Stairs: Yes: Internal: 15 steps; on left going up Has following equipment at home: Single point cane, Walker - 2 wheeled, and Grab bars  PLOF: Independent with basic ADLs, Independent with gait, and Needs assistance with homemaking  PATIENT GOALS: Caregiver goal- is to improve her stability and keep her safe in her home.2  OBJECTIVE:    DIAGNOSTIC FINDINGS:   Narrative & Impression  CLINICAL DATA:  Mixed dementia   EXAM: MRI HEAD WITHOUT CONTRAST   TECHNIQUE: Multiplanar, multiecho pulse sequences of the brain and surrounding structures were obtained without intravenous contrast.   COMPARISON:  None.   FINDINGS: Brain: No acute infarct, mass effect or extra-axial collection. No acute or chronic hemorrhage. There is multifocal hyperintense T2-weighted signal within the white matter. Generalized volume loss without a clear lobar predilection. The midline structures are normal. Old left cerebellar infarct.   Vascular: Major flow voids are preserved.   Skull and upper cervical spine: Normal calvarium and skull base. Visualized upper cervical spine and soft tissues are normal.   Sinuses/Orbits:No paranasal sinus fluid levels or advanced mucosal thickening. No mastoid or middle ear effusion. Normal orbits.   IMPRESSION: 1. No acute intracranial abnormality. 2. Findings of chronic small vessel ischemia and old left cerebellar infarct. 3. Generalized volume loss in a nonspecific pattern. Quantitative, volumetric MRI of the brain may be helpful for more specific evaluation for characteristic atrophy patterns associated with dementia.     Electronically Signed   By: Ulyses Jarred M.D.   On: 11/02/2020 23:42     COGNITION: Overall cognitive status: History of cognitive impairments - at baseline   SENSATION: WFL  COORDINATION: Slow to respond at times  EDEMA:  None observed  MUSCLE TONE: Normal throughout BUE/LE's   POSTURE: rounded shoulders and forward head  LOWER EXTREMITY ROM:     Active  Right Eval Left Eval  Hip flexion 3+ 3+  Hip extension 4 4  Hip abduction 4 4  Hip adduction 4 4  Hip internal rotation 4 4  Hip external rotation 4 4  Knee flexion 4 4  Knee extension 4 4  Ankle dorsiflexion 4 4  Ankle plantarflexion    Ankle inversion    Ankle eversion     (Blank rows =  not tested)  LOWER EXTREMITY ROM  MMT Right Eval Left Eval  Hip flexion WNL  WNL  Hip extension WNL WNL  Hip abduction WNL WNL  Hip adduction WNL WNL  Hip internal rotation WNL WNL  Hip external rotation WNL WNL  Knee flexion WNL WNL  Knee extension WNL WNL  Ankle dorsiflexion WNL WNL  Ankle plantarflexion WNL WNL  Ankle inversion WNL WNL  Ankle eversion WNL WNL  (Blank rows = not tested)    TRANSFERS: Assistive device utilized: None  Sit to stand: Complete Independence Stand to sit: Complete Independence Chair to chair: Complete Independence Floor:  not  tested   GAIT: Gait pattern: decreased step length- Right, decreased step length- Left, and shuffling Distance walked: 150 feet Assistive device utilized: None Level of assistance: SBA Comments: Mild unsteadiness with 180 deg turning and shuffling gait noted  FUNCTIONAL TESTS:  5 times sit to stand: 18.89 sec without UE Support Timed up and go (TUG): 17.23 sec without an AD Berg Balance Scale: 45/56  PATIENT SURVEYS:  FOTO 56  TODAY'S TREATMENT:                                                                                                                              DATE:  10/09/2022  Reassessed all goals for progress visit note #10. See goal section for today's details.     Therex:     STS: with 1 LE on airex pad x10 without UE support each LE (20 total STS)   Forward step up x 15 reps each LE (VC for sequencing) with UE support with 2.5# AW Side step up then down on opp side then back up and return to start with 2.5 # AW x 15 reps each LE  Forward lunges- 2.5# AW x 12 reps each LE   Calf raises 2.5# AW from 1/2 foam-2 sets of 12 reps  Toe raises 2.5# AW from 1/2 foam-2 sets of 12 reps  Deferred gait with resistance today as patient's daughter reported that they were going to [facility] immediately following session.         Pt reliant on PT demo, min to mod multimodal cuing and visual  targeting for successful carryover in exercises.             PATIENT EDUCATION: Education details: purpose of PT and how this service may assist in improving her balance Person educated: Patient, Child(ren), and [name] Education method: Explanation, Demonstration, Tactile cues, and Verbal cues Education comprehension: verbalized understanding, returned demonstration, verbal cues required, tactile cues required, and needs further education  HOME EXERCISE PROGRAM: Access Code: [email or web address] URL: [email or web address] Date: 09/11/2022 Prepared by: Sande Brothers  Exercises - Seated Hip Flexion March with Ankle Weights  - 1 x daily - 3 x weekly - 3 sets - 10 reps - Seated Hip Abduction with Resistance  - 1 x daily - 3 x weekly - 3 sets - 10 reps - Seated Hip Adduction Isometrics with Ball  - 1 x daily - 3 x weekly - 3 sets - 10 reps - Seated Long Arc Quad with Ankle Weight  - 1 x daily - 3 x weekly - 3 sets - 10 reps - Seated Hamstring Curl with Anchored Resistance  - 1 x daily - 3 x weekly - 3 sets - 10 reps - Seated Heel Raise  - 1 x daily - 3 x weekly - 3 sets - 10 reps - Seated Heel Toe Raises  - 1 x daily - 3 x weekly - 3 sets - 10 reps - Sit to  Stand with Arms Crossed  - 1 x daily - 3 x weekly - 3 sets - 10 reps  GOALS: Goals reviewed with patient? Yes  SHORT TERM GOALS: Target date: 10/18/2022  Pt and caregiver (daughter) will be independent with HEP in order to improve strength and balance in order to decrease fall risk and improve function at home and work Baseline: EVAL- Patient has no formal HEP and admits to not exercising.  Goal status: INITIAL   LONG TERM GOALS: Target date: 11/29/2022  Pt will improve FOTO to target score  of > 57 to display perceived improvements in ability to complete ADL's.  Baseline: EVAL= 56  Goal status: INITIAL  2.  Pt will decrease 5TSTS by at least 3 seconds in order to demonstrate clinically significant  improvement in LE strength. Baseline: EVAL=18.89 sec without UE support Goal status: INITIAL  3.  Pt will decrease TUG to below 14 seconds/decrease in order to demonstrate decreased fall risk.  Baseline: EVAL= 17.23 sec without UE support Goal status: INITIAL  4.  Pt will improve BERG by at least 3 points in order to demonstrate clinically significant improvement in balance.  Baseline: EVAL=45/56 Goal status: INITIAL  5.  Patient will deny any falls during episode of care for optimal safety with all household and community distances  Baseline: EVAL= Unsteady yet no reported falls in last 6 months.  Goal status: INITIAL    ASSESSMENT:  CLINICAL IMPRESSION: Patient presented with good motivation today- responding well to all VC and visual demo for performing therex correctly. Patient presented with some fatigue and some Left sided weakness vs right yet able to complete all reps today without any significant issues. She has some impaired cognition and requires close supervision with all activities.  Pt will continue to benefit from skilled PT services to address balance impairments to reduce falls risk.     OBJECTIVE IMPAIRMENTS: Abnormal gait, decreased balance, decreased cognition, decreased coordination, decreased mobility, difficulty walking, and decreased strength.   ACTIVITY LIMITATIONS: carrying, lifting, and stairs  PARTICIPATION LIMITATIONS: cleaning, laundry, shopping, community activity, and yard work  PERSONAL FACTORS: 1 comorbidity: dementia  are also affecting patient's functional outcome.   REHAB POTENTIAL: Good  CLINICAL DECISION MAKING: Evolving/moderate complexity  EVALUATION COMPLEXITY: Low  PLAN:  PT FREQUENCY: 1-2x/week  PT DURATION: 12 weeks  PLANNED INTERVENTIONS: Therapeutic exercises, Therapeutic activity, Neuromuscular re-education, Balance training, Gait training, Patient/Family education, Self Care, Joint mobilization, Stair training, Vestibular  training, Canalith repositioning, DME instructions, Cryotherapy, Moist heat, and Manual therapy  PLAN FOR NEXT SESSION: Continue with LE strengthening and balance training as appropriate.     Ollen Bowl, PT Physical Therapist- [facility]  10/04/2022, 10:08 AM

## 2022-10-09 ENCOUNTER — Ambulatory Visit: Payer: Medicare Other | Admitting: Speech Pathology

## 2022-10-09 ENCOUNTER — Ambulatory Visit: Payer: Medicare Other

## 2022-10-09 DIAGNOSIS — R269 Unspecified abnormalities of gait and mobility: Secondary | ICD-10-CM

## 2022-10-09 DIAGNOSIS — R262 Difficulty in walking, not elsewhere classified: Secondary | ICD-10-CM | POA: Diagnosis not present

## 2022-10-09 DIAGNOSIS — R278 Other lack of coordination: Secondary | ICD-10-CM

## 2022-10-09 DIAGNOSIS — M6281 Muscle weakness (generalized): Secondary | ICD-10-CM

## 2022-10-09 DIAGNOSIS — R41841 Cognitive communication deficit: Secondary | ICD-10-CM

## 2022-10-11 ENCOUNTER — Encounter: Payer: Medicare Other | Admitting: Speech Pathology

## 2022-10-11 ENCOUNTER — Ambulatory Visit: Payer: Medicare Other

## 2022-10-11 DIAGNOSIS — M6281 Muscle weakness (generalized): Secondary | ICD-10-CM

## 2022-10-11 DIAGNOSIS — R278 Other lack of coordination: Secondary | ICD-10-CM

## 2022-10-11 DIAGNOSIS — R269 Unspecified abnormalities of gait and mobility: Secondary | ICD-10-CM

## 2022-10-11 DIAGNOSIS — R262 Difficulty in walking, not elsewhere classified: Secondary | ICD-10-CM | POA: Diagnosis not present

## 2022-10-11 DIAGNOSIS — R41841 Cognitive communication deficit: Secondary | ICD-10-CM

## 2022-10-11 NOTE — Therapy (Signed)
OUTPATIENT PHYSICAL THERAPY NEURO TREATMENT   Patient Name: Jeanette Newton MRN: [id number] DOB:1927-11-26, 89 y.o., female Today's Date: 10/12/2022   PCP: Dr. Emily Filbert REFERRING PROVIDER: Dr. Jennings Books  END OF SESSION:  PT End of Session - 10/11/22 0940     Visit Number 11    Number of Visits 24    Date for PT Re-Evaluation 11/29/22    Progress Note Due on Visit 20    PT Start Time 0930    PT Stop Time 1012    PT Time Calculation (min) 42 min    Equipment Utilized During Treatment Gait belt    Activity Tolerance Patient tolerated treatment well    Behavior During Therapy WFL for tasks assessed/performed              Past Medical History:  Diagnosis Date   Cancer ([place]) 2005-2006   lymphoma   Hypothyroidism    Status post chemoradiation    lymphoma   Status post radiation therapy    Thyroid disease    Wears hearing aid in both ears    Past Surgical History:  Procedure Laterality Date   ABDOMINAL EXPLORATION SURGERY  2005-2006   Dr Pat Patrick   CATARACT EXTRACTION [facility] Left 07/28/2019   Procedure: CATARACT EXTRACTION PHACO AND INTRAOCULAR LENS PLACEMENT ([place]) LEFT 7.83  00:53.4;  Surgeon: Birder Robson, MD;  Location: [place];  Service: Ophthalmology;  Laterality: Left;   CATARACT EXTRACTION W/PHACO Right 08/25/2019   Procedure: CATARACT EXTRACTION PHACO AND INTRAOCULAR LENS PLACEMENT ([facility]) RIGHT 6.04 00:41.0;  Surgeon: Birder Robson, MD;  Location: [place];  Service: Ophthalmology;  Laterality: Right;   CHOLECYSTECTOMY N/A 05/25/2016   Procedure: LAPAROSCOPIC CHOLECYSTECTOMY WITH INTRAOPERATIVE CHOLANGIOGRAM;  Surgeon: Robert Bellow, MD;  Location: [facility];  Service: General;  Laterality: N/A;   ELBOW FRACTURE SURGERY Right    EYE SURGERY     LAPAROTOMY N/A 05/30/2019   Procedure: EXPLORATORY LAPAROTOMYand small bowel resection;  Surgeon: Jules Husbands, MD;  Location: [facility];  Service: General;  Laterality: N/A;   XI  ROBOTIC ASSISTED VENTRAL HERNIA N/A 11/06/2019   Procedure: XI ROBOTIC ASSISTED VENTRAL HERNIA REPAIR WITH MESH;  Surgeon: Jules Husbands, MD;  Location: [facility];  Service: General;  Laterality: N/A;   Patient Active Problem List   Diagnosis Date Noted   Bowel perforation ([place]) 05/30/2019   Calculus of gallbladder with acute cholecystitis without obstruction 05/15/2016    ONSET DATE: Over 6 months  REFERRING DIAG: R26.89 (ICD-10-CM) - Imbalance   THERAPY DIAG:  Difficulty in walking, not elsewhere classified  Muscle weakness (generalized)  Abnormality of gait and mobility  Other lack of coordination  Cognitive communication deficit  Rationale for Evaluation and Treatment: Rehabilitation  SUBJECTIVE:  SUBJECTIVE STATEMENT: Patient states probably not exercising enough but states doing well today and denies any pain or falls.  Pt accompanied by: family member-[name]  PERTINENT HISTORY: Patient is a 89 year old female with Mixed Alzheimers and vascular dementia who lives alone yet daughter stays with her during the day. Reports does not drive and not happy about that yet has been independent with all ADLs- some imbalance however daughter denies any recent falls.   PAIN:  Are you having pain? No  PRECAUTIONS: Fall  WEIGHT BEARING RESTRICTIONS: No  FALLS: Has patient fallen in last 6 months? No  LIVING ENVIRONMENT: Lives with: lives alone and daughter is in and out and lives 2 mi away Lives in: House/apartment Stairs: Yes: Internal: 15 steps; on left going up Has following equipment at home: Single point cane, Walker - 2 wheeled, and Grab bars  PLOF: Independent with basic ADLs, Independent with gait, and Needs assistance with homemaking  PATIENT GOALS: Caregiver goal- is to improve her stability and keep her safe in her home.2  OBJECTIVE:    DIAGNOSTIC FINDINGS:   Narrative & Impression  CLINICAL DATA:  Mixed dementia   EXAM: MRI HEAD WITHOUT CONTRAST   TECHNIQUE: Multiplanar, multiecho pulse sequences of the brain and surrounding structures were obtained without intravenous contrast.   COMPARISON:  None.   FINDINGS: Brain: No acute infarct, mass effect or extra-axial collection. No acute or chronic hemorrhage. There is multifocal hyperintense T2-weighted signal within the white matter. Generalized volume loss without a clear lobar predilection. The midline structures are normal. Old left cerebellar infarct.   Vascular: Major flow voids are preserved.   Skull and upper cervical spine: Normal calvarium and skull base. Visualized upper cervical spine and soft tissues are normal.   Sinuses/Orbits:No paranasal sinus fluid levels or advanced mucosal thickening. No mastoid or middle ear effusion. Normal orbits.   IMPRESSION: 1. No acute intracranial abnormality. 2. Findings of chronic small vessel ischemia and old left cerebellar infarct. 3. Generalized volume loss in a nonspecific pattern. Quantitative, volumetric MRI of the brain may be helpful for more specific evaluation for characteristic atrophy patterns associated with dementia.     Electronically Signed   By: Ulyses Jarred M.D.   On: 11/02/2020 23:42     COGNITION: Overall cognitive status: History of cognitive impairments - at baseline   SENSATION: WFL  COORDINATION: Slow to respond at times  EDEMA:  None observed  MUSCLE TONE: Normal throughout BUE/LE's   POSTURE: rounded shoulders and forward head  LOWER EXTREMITY ROM:     Active  Right Eval Left Eval  Hip flexion 3+ 3+  Hip extension 4 4  Hip abduction 4 4  Hip adduction 4 4  Hip internal rotation 4 4  Hip external rotation 4 4  Knee flexion 4 4  Knee extension 4 4  Ankle dorsiflexion 4 4  Ankle plantarflexion    Ankle inversion    Ankle eversion     (Blank rows =  not tested)  LOWER EXTREMITY ROM  MMT Right Eval Left Eval  Hip flexion WNL  WNL  Hip extension WNL WNL  Hip abduction WNL WNL  Hip adduction WNL WNL  Hip internal rotation WNL WNL  Hip external rotation WNL WNL  Knee flexion WNL WNL  Knee extension WNL WNL  Ankle dorsiflexion WNL WNL  Ankle plantarflexion WNL WNL  Ankle inversion WNL WNL  Ankle eversion WNL WNL  (Blank rows = not tested)    TRANSFERS: Assistive device utilized: None  Sit to stand: Complete Independence Stand to sit: Complete Independence Chair to chair: Complete Independence Floor:  not  tested   GAIT: Gait pattern: decreased step length- Right, decreased step length- Left, and shuffling Distance walked: 150 feet Assistive device utilized: None Level of assistance: SBA Comments: Mild unsteadiness with 180 deg turning and shuffling gait noted  FUNCTIONAL TESTS:  5 times sit to stand: 18.89 sec without UE Support Timed up and go (TUG): 17.23 sec without an AD Berg Balance Scale: 45/56  PATIENT SURVEYS:  FOTO 56  TODAY'S TREATMENT:                                                                                                                              DATE:  10/11/2022   Therex:     STS: with 1 LE on green dyna disc x10 without UE support each LE (20 total STS)   Forward step up x 15 reps each LE (VC for sequencing) with UE support with 3# AW Side step up then down on opp side then back up and return to start with 3 # AW x 15 reps each LE   Calf raises 3# AW from 1/2 foam-2 sets of 12 reps  Toe raises 3# AW from 1/2 foam-2 sets of 12 reps  Side step up/over 1/2 foam with 3# AW x 15 reps  Resistive Amb with 3#AW 300 feet  Ambulation without AW 300 feet     Pt reliant on PT demo, min to mod multimodal cuing and visual targeting for successful carryover in exercises.             PATIENT EDUCATION: Education details: purpose of PT and how this service may assist in  improving her balance Person educated: Patient, Child(ren), and [name] Education method: Explanation, Demonstration, Tactile cues, and Verbal cues Education comprehension: verbalized understanding, returned demonstration, verbal cues required, tactile cues required, and needs further education  HOME EXERCISE PROGRAM: Access Code: [email or web address] URL: [email or web address] Date: 09/11/2022 Prepared by: Sande Brothers  Exercises - Seated Hip Flexion March with Ankle Weights  - 1 x daily - 3 x weekly - 3 sets - 10 reps - Seated Hip Abduction with Resistance  - 1 x daily - 3 x weekly - 3 sets - 10 reps - Seated Hip Adduction Isometrics with Ball  - 1 x daily - 3 x weekly - 3 sets - 10 reps - Seated Long Arc Quad with Ankle Weight  - 1 x daily - 3 x weekly - 3 sets - 10 reps - Seated Hamstring Curl with Anchored Resistance  - 1 x daily - 3 x weekly - 3 sets - 10 reps - Seated Heel Raise  - 1 x daily - 3 x weekly - 3 sets - 10 reps - Seated Heel Toe Raises  - 1 x daily - 3 x weekly - 3 sets - 10 reps - Sit to Stand with Arms Crossed  - 1 x daily - 3 x weekly - 3 sets - 10 reps  GOALS: Goals reviewed with  patient? Yes  SHORT TERM GOALS: Target date: 10/18/2022  Pt and caregiver (daughter) will be independent with HEP in order to improve strength and balance in order to decrease fall risk and improve function at home and work Baseline: EVAL- Patient has no formal HEP and admits to not exercising.  Goal status: INITIAL   LONG TERM GOALS: Target date: 11/29/2022  Pt will improve FOTO to target score  of > 57 to display perceived improvements in ability to complete ADL's.  Baseline: EVAL= 56  Goal status: INITIAL  2.  Pt will decrease 5TSTS by at least 3 seconds in order to demonstrate clinically significant improvement in LE strength. Baseline: EVAL=18.89 sec without UE support Goal status: INITIAL  3.  Pt will decrease TUG to below 14 seconds/decrease in order to  demonstrate decreased fall risk.  Baseline: EVAL= 17.23 sec without UE support Goal status: INITIAL  4.  Pt will improve BERG by at least 3 points in order to demonstrate clinically significant improvement in balance.  Baseline: EVAL=45/56 Goal status: INITIAL  5.  Patient will deny any falls during episode of care for optimal safety with all household and community distances  Baseline: EVAL= Unsteady yet no reported falls in last 6 months.  Goal status: INITIAL    ASSESSMENT:  CLINICAL IMPRESSION: Treatment continued per plan of care focusing on LE strengthening, coordination and improved functional mobility. She presents with improved performance- able to accept more resistance today with all strengthening and later able to walk today with resistance without LOB. She presents with intermittent staggering at time yet mostly supervision due to impaired cognition.  Pt will continue to benefit from skilled PT services to address balance impairments to reduce falls risk.     OBJECTIVE IMPAIRMENTS: Abnormal gait, decreased balance, decreased cognition, decreased coordination, decreased mobility, difficulty walking, and decreased strength.   ACTIVITY LIMITATIONS: carrying, lifting, and stairs  PARTICIPATION LIMITATIONS: cleaning, laundry, shopping, community activity, and yard work  PERSONAL FACTORS: 1 comorbidity: dementia  are also affecting patient's functional outcome.   REHAB POTENTIAL: Good  CLINICAL DECISION MAKING: Evolving/moderate complexity  EVALUATION COMPLEXITY: Low  PLAN:  PT FREQUENCY: 1-2x/week  PT DURATION: 12 weeks  PLANNED INTERVENTIONS: Therapeutic exercises, Therapeutic activity, Neuromuscular re-education, Balance training, Gait training, Patient/Family education, Self Care, Joint mobilization, Stair training, Vestibular training, Canalith repositioning, DME instructions, Cryotherapy, Moist heat, and Manual therapy  PLAN FOR NEXT SESSION: Continue with LE  strengthening and balance training as appropriate.     Ollen Bowl, PT Physical Therapist- [facility]  10/12/2022, 8:52 AM

## 2022-10-16 ENCOUNTER — Ambulatory Visit: Payer: Medicare Other | Admitting: Speech Pathology

## 2022-10-16 ENCOUNTER — Ambulatory Visit: Payer: Medicare Other

## 2022-10-16 DIAGNOSIS — R262 Difficulty in walking, not elsewhere classified: Secondary | ICD-10-CM

## 2022-10-16 DIAGNOSIS — R278 Other lack of coordination: Secondary | ICD-10-CM

## 2022-10-16 DIAGNOSIS — R269 Unspecified abnormalities of gait and mobility: Secondary | ICD-10-CM

## 2022-10-16 DIAGNOSIS — M6281 Muscle weakness (generalized): Secondary | ICD-10-CM

## 2022-10-16 NOTE — Therapy (Signed)
OUTPATIENT PHYSICAL THERAPY NEURO TREATMENT   Patient Name: Jeanette Newton MRN: [id number] DOB:1927/12/01, 89 y.o., female Today's Date: 10/16/2022   PCP: Dr. Emily Filbert REFERRING PROVIDER: Dr. Jennings Books  END OF SESSION:  PT End of Session - 10/16/22 0923     Visit Number 12    Number of Visits 24    Date for PT Re-Evaluation 11/29/22    Progress Note Due on Visit 20    PT Start Time 0924    PT Stop Time 1005    PT Time Calculation (min) 41 min    Equipment Utilized During Treatment Gait belt    Activity Tolerance Patient tolerated treatment well    Behavior During Therapy [facility] for tasks assessed/performed              Past Medical History:  Diagnosis Date   Cancer ([place]) 2005-2006   lymphoma   Hypothyroidism    Status post chemoradiation    lymphoma   Status post radiation therapy    Thyroid disease    Wears hearing aid in both ears    Past Surgical History:  Procedure Laterality Date   ABDOMINAL EXPLORATION SURGERY  2005-2006   Dr Pat Patrick   CATARACT EXTRACTION [facility] Left 07/28/2019   Procedure: CATARACT EXTRACTION PHACO AND INTRAOCULAR LENS PLACEMENT ([place]) LEFT 7.83  00:53.4;  Surgeon: Birder Robson, MD;  Location: [place];  Service: Ophthalmology;  Laterality: Left;   CATARACT EXTRACTION W/PHACO Right 08/25/2019   Procedure: CATARACT EXTRACTION PHACO AND INTRAOCULAR LENS PLACEMENT ([facility]) RIGHT 6.04 00:41.0;  Surgeon: Birder Robson, MD;  Location: [place];  Service: Ophthalmology;  Laterality: Right;   CHOLECYSTECTOMY N/A 05/25/2016   Procedure: LAPAROSCOPIC CHOLECYSTECTOMY WITH INTRAOPERATIVE CHOLANGIOGRAM;  Surgeon: Robert Bellow, MD;  Location: [facility];  Service: General;  Laterality: N/A;   ELBOW FRACTURE SURGERY Right    EYE SURGERY     LAPAROTOMY N/A 05/30/2019   Procedure: EXPLORATORY LAPAROTOMYand small bowel resection;  Surgeon: Jules Husbands, MD;  Location: [facility];  Service: General;  Laterality: N/A;   XI  ROBOTIC ASSISTED VENTRAL HERNIA N/A 11/06/2019   Procedure: XI ROBOTIC ASSISTED VENTRAL HERNIA REPAIR WITH MESH;  Surgeon: Jules Husbands, MD;  Location: [facility];  Service: General;  Laterality: N/A;   Patient Active Problem List   Diagnosis Date Noted   Bowel perforation ([place]) 05/30/2019   Calculus of gallbladder with acute cholecystitis without obstruction 05/15/2016    ONSET DATE: Over 6 months  REFERRING DIAG: R26.89 (ICD-10-CM) - Imbalance   THERAPY DIAG:  Difficulty in walking, not elsewhere classified  Muscle weakness (generalized)  Abnormality of gait and mobility  Other lack of coordination  Rationale for Evaluation and Treatment: Rehabilitation  SUBJECTIVE:  SUBJECTIVE STATEMENT: Patient reports doing well stating "Don't you get tired of seeing me."  Pt accompanied by: family member-[name]  PERTINENT HISTORY: Patient is a 89 year old female with Mixed Alzheimers and vascular dementia who lives alone yet daughter stays with her during the day. Reports does not drive and not happy about that yet has been independent with all ADLs- some imbalance however daughter denies any recent falls.   PAIN:  Are you having pain? No  PRECAUTIONS: Fall  WEIGHT BEARING RESTRICTIONS: No  FALLS: Has patient fallen in last 6 months? No  LIVING ENVIRONMENT: Lives with: lives alone and daughter is in and out and lives 2 mi away Lives in: House/apartment Stairs: Yes: Internal: 15 steps; on left going up Has following equipment at home: Single point cane, Walker - 2 wheeled, and Grab bars  PLOF: Independent with basic ADLs, Independent with gait, and Needs assistance with homemaking  PATIENT GOALS: Caregiver goal- is to improve her stability and keep her safe in her home.2  OBJECTIVE:   DIAGNOSTIC FINDINGS:   Narrative & Impression  CLINICAL DATA:  Mixed  dementia   EXAM: MRI HEAD WITHOUT CONTRAST   TECHNIQUE: Multiplanar, multiecho pulse sequences of the brain and surrounding structures were obtained without intravenous contrast.   COMPARISON:  None.   FINDINGS: Brain: No acute infarct, mass effect or extra-axial collection. No acute or chronic hemorrhage. There is multifocal hyperintense T2-weighted signal within the white matter. Generalized volume loss without a clear lobar predilection. The midline structures are normal. Old left cerebellar infarct.   Vascular: Major flow voids are preserved.   Skull and upper cervical spine: Normal calvarium and skull base. Visualized upper cervical spine and soft tissues are normal.   Sinuses/Orbits:No paranasal sinus fluid levels or advanced mucosal thickening. No mastoid or middle ear effusion. Normal orbits.   IMPRESSION: 1. No acute intracranial abnormality. 2. Findings of chronic small vessel ischemia and old left cerebellar infarct. 3. Generalized volume loss in a nonspecific pattern. Quantitative, volumetric MRI of the brain may be helpful for more specific evaluation for characteristic atrophy patterns associated with dementia.     Electronically Signed   By: Ulyses Jarred M.D.   On: 11/02/2020 23:42     COGNITION: Overall cognitive status: History of cognitive impairments - at baseline   SENSATION: WFL  COORDINATION: Slow to respond at times  EDEMA:  None observed  MUSCLE TONE: Normal throughout BUE/LE's   POSTURE: rounded shoulders and forward head  LOWER EXTREMITY ROM:     Active  Right Eval Left Eval  Hip flexion 3+ 3+  Hip extension 4 4  Hip abduction 4 4  Hip adduction 4 4  Hip internal rotation 4 4  Hip external rotation 4 4  Knee flexion 4 4  Knee extension 4 4  Ankle dorsiflexion 4 4  Ankle plantarflexion    Ankle inversion    Ankle eversion     (Blank rows = not tested)  LOWER EXTREMITY ROM  MMT Right Eval Left Eval  Hip  flexion WNL  WNL  Hip extension WNL WNL  Hip abduction WNL WNL  Hip adduction WNL WNL  Hip internal rotation WNL WNL  Hip external rotation WNL WNL  Knee flexion WNL WNL  Knee extension WNL WNL  Ankle dorsiflexion WNL WNL  Ankle plantarflexion WNL WNL  Ankle inversion WNL WNL  Ankle eversion WNL WNL  (Blank rows = not tested)    TRANSFERS: Assistive device utilized: None  Sit to stand: Complete Independence Stand to sit: Complete Independence Chair to chair: Complete Independence Floor:  not  tested   GAIT: Gait pattern: decreased step length- Right, decreased step length- Left, and shuffling Distance walked: 150 feet Assistive device utilized: None Level of assistance: SBA Comments: Mild unsteadiness with 180 deg turning and shuffling gait noted  FUNCTIONAL TESTS:  5 times sit to stand: 18.89 sec without UE Support Timed up and go (TUG): 17.23 sec without an AD Berg Balance Scale: 45/56  PATIENT SURVEYS:  FOTO 56  TODAY'S TREATMENT:                                                                                                                              DATE:  10/16/2022   Therex:    LAQ with 3# AW alt LE x 12 reps  Sit to stand wihtout UE support x 10 reps  NMR:  Dynamic marching with 3# AW- no UE support x 20 reps. Dynamic squatting- working on form without UE support -reaching down to floor and reach for cones x 10.  Side step up/over 1/2 foam (x 3) in // bars without UE support  with 3# AW - down and back x 5.   Forward step over 1/2 foam (x 3) in // bars without UE support with 3# AW - down and back x 5   Gait in hallway with horizontal head turning- x 60 feet x 2   Tandem gait on airex beam - down and back x 5 without UE support.   Rocker board - A/P without UE x 15 reps Rocker board Lateral without UE support x 15 reps   Pt reliant on PT demo, min to mod multimodal cuing and visual targeting for successful carryover in exercises.              PATIENT EDUCATION: Education details: purpose of PT and how this service may assist in improving her balance Person educated: Patient, Child(ren), and [name] Education method: Explanation, Demonstration, Tactile cues, and Verbal cues Education comprehension: verbalized understanding, returned demonstration, verbal cues required, tactile cues required, and needs further education  HOME EXERCISE PROGRAM: Access Code: [email or web address] URL: [email or web address] Date: 09/11/2022 Prepared by: Sande Brothers  Exercises - Seated Hip Flexion March with Ankle Weights  - 1 x daily - 3 x weekly - 3 sets - 10 reps - Seated Hip Abduction with Resistance  - 1 x daily - 3 x weekly - 3 sets - 10 reps - Seated Hip Adduction Isometrics with Ball  - 1 x daily - 3 x weekly - 3 sets - 10 reps - Seated Long Arc Quad with Ankle Weight  - 1 x daily - 3 x weekly - 3 sets - 10 reps - Seated Hamstring Curl with Anchored Resistance  - 1 x daily - 3 x weekly - 3 sets - 10 reps - Seated Heel Raise  - 1 x daily - 3 x weekly - 3 sets - 10 reps - Seated Heel Toe Raises  - 1 x daily -  3 x weekly - 3 sets - 10 reps - Sit to Stand with Arms Crossed  - 1 x daily - 3 x weekly - 3 sets - 10 reps  GOALS: Goals reviewed with patient? Yes  SHORT TERM GOALS: Target date: 10/18/2022  Pt and caregiver (daughter) will be independent with HEP in order to improve strength and balance in order to decrease fall risk and improve function at home and work Baseline: EVAL- Patient has no formal HEP and admits to not exercising.  Goal status: INITIAL   LONG TERM GOALS: Target date: 11/29/2022  Pt will improve FOTO to target score  of > 57 to display perceived improvements in ability to complete ADL's.  Baseline: EVAL= 56  Goal status: INITIAL  2.  Pt will decrease 5TSTS by at least 3 seconds in order to demonstrate clinically significant improvement in LE strength. Baseline: EVAL=18.89 sec  without UE support Goal status: INITIAL  3.  Pt will decrease TUG to below 14 seconds/decrease in order to demonstrate decreased fall risk.  Baseline: EVAL= 17.23 sec without UE support Goal status: INITIAL  4.  Pt will improve BERG by at least 3 points in order to demonstrate clinically significant improvement in balance.  Baseline: EVAL=45/56 Goal status: INITIAL  5.  Patient will deny any falls during episode of care for optimal safety with all household and community distances  Baseline: EVAL= Unsteady yet no reported falls in last 6 months.  Goal status: INITIAL    ASSESSMENT:  CLINICAL IMPRESSION: Treatment continued per plan of care focusing on balance mostly today. Patient did exhibit some unsteadiness with horizontal head turns, tandem gait and some reaching but with every activity able to improve with VC and practice. She was unsteady initially with several activities but no significant loss of balance. She responded well demonstrating mostly ankle righting reaction with all dynamic activities today.  Pt will continue to benefit from skilled PT services to address balance impairments to reduce falls risk.     OBJECTIVE IMPAIRMENTS: Abnormal gait, decreased balance, decreased cognition, decreased coordination, decreased mobility, difficulty walking, and decreased strength.   ACTIVITY LIMITATIONS: carrying, lifting, and stairs  PARTICIPATION LIMITATIONS: cleaning, laundry, shopping, community activity, and yard work  PERSONAL FACTORS: 1 comorbidity: dementia  are also affecting patient's functional outcome.   REHAB POTENTIAL: Good  CLINICAL DECISION MAKING: Evolving/moderate complexity  EVALUATION COMPLEXITY: Low  PLAN:  PT FREQUENCY: 1-2x/week  PT DURATION: 12 weeks  PLANNED INTERVENTIONS: Therapeutic exercises, Therapeutic activity, Neuromuscular re-education, Balance training, Gait training, Patient/Family education, Self Care, Joint mobilization, Stair  training, Vestibular training, Canalith repositioning, DME instructions, Cryotherapy, Moist heat, and Manual therapy  PLAN FOR NEXT SESSION: Continue with LE strengthening and balance training as appropriate.     Ollen Bowl, PT Physical Therapist- [facility]  10/16/2022, 10:35 AM

## 2022-10-18 ENCOUNTER — Encounter: Payer: Medicare Other | Admitting: Speech Pathology

## 2022-10-18 ENCOUNTER — Ambulatory Visit: Payer: Medicare Other

## 2022-10-18 DIAGNOSIS — R262 Difficulty in walking, not elsewhere classified: Secondary | ICD-10-CM

## 2022-10-18 DIAGNOSIS — R278 Other lack of coordination: Secondary | ICD-10-CM

## 2022-10-18 DIAGNOSIS — M6281 Muscle weakness (generalized): Secondary | ICD-10-CM

## 2022-10-18 DIAGNOSIS — R269 Unspecified abnormalities of gait and mobility: Secondary | ICD-10-CM

## 2022-10-18 NOTE — Therapy (Signed)
OUTPATIENT PHYSICAL THERAPY NEURO TREATMENT   Patient Name: Jeanette Newton MRN: [id number] DOB:03-28-1928, 89 y.o., female Today's Date: 10/18/2022   PCP: Dr. Emily Filbert REFERRING PROVIDER: Dr. Jennings Books  END OF SESSION:  PT End of Session - 10/18/22 0930     Visit Number 13    Number of Visits 24    Date for PT Re-Evaluation 11/29/22    Progress Note Due on Visit 20    PT Start Time 0930    PT Stop Time 1012    PT Time Calculation (min) 42 min    Equipment Utilized During Treatment Gait belt    Activity Tolerance Patient tolerated treatment well    Behavior During Therapy WFL for tasks assessed/performed              Past Medical History:  Diagnosis Date   Cancer ([place]) 2005-2006   lymphoma   Hypothyroidism    Status post chemoradiation    lymphoma   Status post radiation therapy    Thyroid disease    Wears hearing aid in both ears    Past Surgical History:  Procedure Laterality Date   ABDOMINAL EXPLORATION SURGERY  2005-2006   Dr Pat Patrick   CATARACT EXTRACTION [facility] Left 07/28/2019   Procedure: CATARACT EXTRACTION PHACO AND INTRAOCULAR LENS PLACEMENT ([place]) LEFT 7.83  00:53.4;  Surgeon: Birder Robson, MD;  Location: [place];  Service: Ophthalmology;  Laterality: Left;   CATARACT EXTRACTION W/PHACO Right 08/25/2019   Procedure: CATARACT EXTRACTION PHACO AND INTRAOCULAR LENS PLACEMENT ([facility]) RIGHT 6.04 00:41.0;  Surgeon: Birder Robson, MD;  Location: [place];  Service: Ophthalmology;  Laterality: Right;   CHOLECYSTECTOMY N/A 05/25/2016   Procedure: LAPAROSCOPIC CHOLECYSTECTOMY WITH INTRAOPERATIVE CHOLANGIOGRAM;  Surgeon: Robert Bellow, MD;  Location: [facility];  Service: General;  Laterality: N/A;   ELBOW FRACTURE SURGERY Right    EYE SURGERY     LAPAROTOMY N/A 05/30/2019   Procedure: EXPLORATORY LAPAROTOMYand small bowel resection;  Surgeon: Jules Husbands, MD;  Location: [facility];  Service: General;  Laterality: N/A;   XI  ROBOTIC ASSISTED VENTRAL HERNIA N/A 11/06/2019   Procedure: XI ROBOTIC ASSISTED VENTRAL HERNIA REPAIR WITH MESH;  Surgeon: Jules Husbands, MD;  Location: [facility];  Service: General;  Laterality: N/A;   Patient Active Problem List   Diagnosis Date Noted   Bowel perforation ([place]) 05/30/2019   Calculus of gallbladder with acute cholecystitis without obstruction 05/15/2016    ONSET DATE: Over 6 months  REFERRING DIAG: R26.89 (ICD-10-CM) - Imbalance   THERAPY DIAG:  Difficulty in walking, not elsewhere classified  Muscle weakness (generalized)  Abnormality of gait and mobility  Other lack of coordination  Rationale for Evaluation and Treatment: Rehabilitation  SUBJECTIVE:  SUBJECTIVE STATEMENT: Patient reports noticing a small knot on her right lower leg above her knee- not painful.  Pt accompanied by: family member-[name]  PERTINENT HISTORY: Patient is a 89 year old female with Mixed Alzheimers and vascular dementia who lives alone yet daughter stays with her during the day. Reports does not drive and not happy about that yet has been independent with all ADLs- some imbalance however daughter denies any recent falls.   PAIN:  Are you having pain? No  PRECAUTIONS: Fall  WEIGHT BEARING RESTRICTIONS: No  FALLS: Has patient fallen in last 6 months? No  LIVING ENVIRONMENT: Lives with: lives alone and daughter is in and out and lives 2 mi away Lives in: House/apartment Stairs: Yes: Internal: 15 steps; on left going up Has following equipment at home: Single point cane, Walker - 2 wheeled, and Grab bars  PLOF: Independent with basic ADLs, Independent with gait, and Needs assistance with homemaking  PATIENT GOALS: Caregiver goal- is to improve her stability and keep her safe in her home.2  OBJECTIVE:   DIAGNOSTIC FINDINGS:   Narrative & Impression  CLINICAL DATA:  Mixed dementia   EXAM: MRI  HEAD WITHOUT CONTRAST   TECHNIQUE: Multiplanar, multiecho pulse sequences of the brain and surrounding structures were obtained without intravenous contrast.   COMPARISON:  None.   FINDINGS: Brain: No acute infarct, mass effect or extra-axial collection. No acute or chronic hemorrhage. There is multifocal hyperintense T2-weighted signal within the white matter. Generalized volume loss without a clear lobar predilection. The midline structures are normal. Old left cerebellar infarct.   Vascular: Major flow voids are preserved.   Skull and upper cervical spine: Normal calvarium and skull base. Visualized upper cervical spine and soft tissues are normal.   Sinuses/Orbits:No paranasal sinus fluid levels or advanced mucosal thickening. No mastoid or middle ear effusion. Normal orbits.   IMPRESSION: 1. No acute intracranial abnormality. 2. Findings of chronic small vessel ischemia and old left cerebellar infarct. 3. Generalized volume loss in a nonspecific pattern. Quantitative, volumetric MRI of the brain may be helpful for more specific evaluation for characteristic atrophy patterns associated with dementia.     Electronically Signed   By: Ulyses Jarred M.D.   On: 11/02/2020 23:42     COGNITION: Overall cognitive status: History of cognitive impairments - at baseline   SENSATION: WFL  COORDINATION: Slow to respond at times  EDEMA:  None observed  MUSCLE TONE: Normal throughout BUE/LE's   POSTURE: rounded shoulders and forward head  LOWER EXTREMITY ROM:     Active  Right Eval Left Eval  Hip flexion 3+ 3+  Hip extension 4 4  Hip abduction 4 4  Hip adduction 4 4  Hip internal rotation 4 4  Hip external rotation 4 4  Knee flexion 4 4  Knee extension 4 4  Ankle dorsiflexion 4 4  Ankle plantarflexion    Ankle inversion    Ankle eversion     (Blank rows = not tested)  LOWER EXTREMITY ROM  MMT Right Eval Left Eval  Hip flexion WNL  WNL  Hip  extension WNL WNL  Hip abduction WNL WNL  Hip adduction WNL WNL  Hip internal rotation WNL WNL  Hip external rotation WNL WNL  Knee flexion WNL WNL  Knee extension WNL WNL  Ankle dorsiflexion WNL WNL  Ankle plantarflexion WNL WNL  Ankle inversion WNL WNL  Ankle eversion WNL WNL  (Blank rows = not tested)    TRANSFERS: Assistive device utilized: None  Sit to stand: Complete Independence Stand to sit: Complete Independence Chair to chair: Complete Independence Floor:  not  tested   GAIT: Gait pattern: decreased step length- Right, decreased step length- Left, and shuffling Distance walked: 150 feet Assistive device utilized: None Level of assistance: SBA Comments: Mild unsteadiness with 180 deg turning and shuffling gait noted  FUNCTIONAL TESTS:  5 times sit to stand: 18.89 sec without UE Support Timed up and go (TUG): 17.23 sec without an AD Berg Balance Scale: 45/56  PATIENT SURVEYS:  FOTO 56  TODAY'S TREATMENT:                                                                                                                              DATE:  10/18/2022   Therex:   Step up without UE support x 10 reps   Sit to stand with UE Raises- holding onto 2kg ball  x 10 reps  NMR:  Dynamic side step walking around 3 cones and 1 hurdle- VC and visual demo for correct technique- no UE support - down and back x 5 .  Dynamic forward walking around 3 cones and 1 hurdle- VC and visual demo for correct technique- no UE support - down and back x 5  Side step up/over airex beam without UE support  x15.   Forward step over airex beam without UE support  x15    NEAR Tandem gait in gym without any UE- down and back x 10 feet x 5 without UE support.   Retro gait in gym without any UE support- Mild lateral Drift x 10 feet and back x 4.   Tandem gait on airex beam with 1 UE   Pt reliant on PT demo, min to mod multimodal cuing and visual targeting for successful carryover in  exercises.             PATIENT EDUCATION: Education details: purpose of PT and how this service may assist in improving her balance Person educated: Patient, Child(ren), and [name] Education method: Explanation, Demonstration, Tactile cues, and Verbal cues Education comprehension: verbalized understanding, returned demonstration, verbal cues required, tactile cues required, and needs further education  HOME EXERCISE PROGRAM: Access Code: [email or web address] URL: [email or web address] Date: 09/11/2022 Prepared by: Sande Brothers  Exercises - Seated Hip Flexion March with Ankle Weights  - 1 x daily - 3 x weekly - 3 sets - 10 reps - Seated Hip Abduction with Resistance  - 1 x daily - 3 x weekly - 3 sets - 10 reps - Seated Hip Adduction Isometrics with Ball  - 1 x daily - 3 x weekly - 3 sets - 10 reps - Seated Long Arc Quad with Ankle Weight  - 1 x daily - 3 x weekly - 3 sets - 10 reps - Seated Hamstring Curl with Anchored Resistance  - 1 x daily - 3 x weekly - 3 sets - 10 reps - Seated Heel Raise  - 1 x daily - 3 x weekly - 3 sets - 10 reps - Seated Heel Toe Raises  -  1 x daily - 3 x weekly - 3 sets - 10 reps - Sit to Stand with Arms Crossed  - 1 x daily - 3 x weekly - 3 sets - 10 reps  GOALS: Goals reviewed with patient? Yes  SHORT TERM GOALS: Target date: 10/18/2022  Pt and caregiver (daughter) will be independent with HEP in order to improve strength and balance in order to decrease fall risk and improve function at home and work Baseline: EVAL- Patient has no formal HEP and admits to not exercising.  Goal status: INITIAL   LONG TERM GOALS: Target date: 11/29/2022  Pt will improve FOTO to target score  of > 57 to display perceived improvements in ability to complete ADL's.  Baseline: EVAL= 56  Goal status: INITIAL  2.  Pt will decrease 5TSTS by at least 3 seconds in order to demonstrate clinically significant improvement in LE strength. Baseline:  EVAL=18.89 sec without UE support Goal status: INITIAL  3.  Pt will decrease TUG to below 14 seconds/decrease in order to demonstrate decreased fall risk.  Baseline: EVAL= 17.23 sec without UE support Goal status: INITIAL  4.  Pt will improve BERG by at least 3 points in order to demonstrate clinically significant improvement in balance.  Baseline: EVAL=45/56 Goal status: INITIAL  5.  Patient will deny any falls during episode of care for optimal safety with all household and community distances  Baseline: EVAL= Unsteady yet no reported falls in last 6 months.  Goal status: INITIAL    ASSESSMENT:  CLINICAL IMPRESSION: Treatment continued with focus on balance to achieve her goal of improved functional mobility and decreased risk of falling. She continues to perform well - less unsteadiness with stepping over/around objects and no significant LOB.  Pt will continue to benefit from skilled PT services to address balance impairments to reduce falls risk.     OBJECTIVE IMPAIRMENTS: Abnormal gait, decreased balance, decreased cognition, decreased coordination, decreased mobility, difficulty walking, and decreased strength.   ACTIVITY LIMITATIONS: carrying, lifting, and stairs  PARTICIPATION LIMITATIONS: cleaning, laundry, shopping, community activity, and yard work  PERSONAL FACTORS: 1 comorbidity: dementia  are also affecting patient's functional outcome.   REHAB POTENTIAL: Good  CLINICAL DECISION MAKING: Evolving/moderate complexity  EVALUATION COMPLEXITY: Low  PLAN:  PT FREQUENCY: 1-2x/week  PT DURATION: 12 weeks  PLANNED INTERVENTIONS: Therapeutic exercises, Therapeutic activity, Neuromuscular re-education, Balance training, Gait training, Patient/Family education, Self Care, Joint mobilization, Stair training, Vestibular training, Canalith repositioning, DME instructions, Cryotherapy, Moist heat, and Manual therapy  PLAN FOR NEXT SESSION: Continue with LE strengthening  and balance training as appropriate.     Ollen Bowl, PT Physical Therapist- [facility]  10/18/2022, 10:21 AM+

## 2022-10-22 NOTE — Therapy (Signed)
OUTPATIENT PHYSICAL THERAPY NEURO TREATMENT   Patient Name: Jeanette Newton MRN: [id number] DOB:01-Jan-1928, 89 y.o., female Today's Date: 10/23/2022   PCP: Dr. Emily Filbert REFERRING PROVIDER: Dr. Jennings Books  END OF SESSION:  PT End of Session - 10/23/22 0934     Visit Number 14    Number of Visits 24    Date for PT Re-Evaluation 11/29/22    Progress Note Due on Visit 20    PT Start Time 0931    PT Stop Time 1013    PT Time Calculation (min) 42 min    Equipment Utilized During Treatment Gait belt    Activity Tolerance Patient tolerated treatment well    Behavior During Therapy [facility] for tasks assessed/performed               Past Medical History:  Diagnosis Date   Cancer ([place]) 2005-2006   lymphoma   Hypothyroidism    Status post chemoradiation    lymphoma   Status post radiation therapy    Thyroid disease    Wears hearing aid in both ears    Past Surgical History:  Procedure Laterality Date   ABDOMINAL EXPLORATION SURGERY  2005-2006   Dr Pat Patrick   CATARACT EXTRACTION [facility] Left 07/28/2019   Procedure: CATARACT EXTRACTION PHACO AND INTRAOCULAR LENS PLACEMENT ([place]) LEFT 7.83  00:53.4;  Surgeon: Birder Robson, MD;  Location: [place];  Service: Ophthalmology;  Laterality: Left;   CATARACT EXTRACTION W/PHACO Right 08/25/2019   Procedure: CATARACT EXTRACTION PHACO AND INTRAOCULAR LENS PLACEMENT ([facility]) RIGHT 6.04 00:41.0;  Surgeon: Birder Robson, MD;  Location: [place];  Service: Ophthalmology;  Laterality: Right;   CHOLECYSTECTOMY N/A 05/25/2016   Procedure: LAPAROSCOPIC CHOLECYSTECTOMY WITH INTRAOPERATIVE CHOLANGIOGRAM;  Surgeon: Robert Bellow, MD;  Location: [facility];  Service: General;  Laterality: N/A;   ELBOW FRACTURE SURGERY Right    EYE SURGERY     LAPAROTOMY N/A 05/30/2019   Procedure: EXPLORATORY LAPAROTOMYand small bowel resection;  Surgeon: Jules Husbands, MD;  Location: [facility];  Service: General;  Laterality: N/A;   XI  ROBOTIC ASSISTED VENTRAL HERNIA N/A 11/06/2019   Procedure: XI ROBOTIC ASSISTED VENTRAL HERNIA REPAIR WITH MESH;  Surgeon: Jules Husbands, MD;  Location: [facility];  Service: General;  Laterality: N/A;   Patient Active Problem List   Diagnosis Date Noted   Bowel perforation ([place]) 05/30/2019   Calculus of gallbladder with acute cholecystitis without obstruction 05/15/2016    ONSET DATE: Over 6 months  REFERRING DIAG: R26.89 (ICD-10-CM) - Imbalance   THERAPY DIAG:  Difficulty in walking, not elsewhere classified  Muscle weakness (generalized)  Abnormality of gait and mobility  Other lack of coordination  Cognitive communication deficit  Rationale for Evaluation and Treatment: Rehabilitation  SUBJECTIVE:  SUBJECTIVE STATEMENT: Patient daughter reports patient fell out after last session on a trip to [place] right after per PT  session. Daughter- [name] reports she was out for like a minute. EMS responded but did not recommend going to hospital and she notified MD- Dr. Sabra Heck of incidence.  Pt accompanied by: family member-[name]  PERTINENT HISTORY: Patient is a 89 year old female with Mixed Alzheimers and vascular dementia who lives alone yet daughter stays with her during the day. Reports does not drive and not happy about that yet has been independent with all ADLs- some imbalance however daughter denies any recent falls.   PAIN:  Are you having pain? No  PRECAUTIONS: Fall  WEIGHT BEARING RESTRICTIONS: No  FALLS: Has patient fallen in last 6 months? No  LIVING ENVIRONMENT: Lives with: lives alone and daughter is in and out and lives 2 mi away Lives in: House/apartment Stairs: Yes: Internal: 15 steps; on left going up Has following equipment at home: Single point cane, Walker - 2 wheeled, and Grab bars  PLOF: Independent with basic ADLs, Independent with gait, and Needs assistance with  homemaking  PATIENT GOALS: Caregiver goal- is to improve her stability and keep her safe in her home.2  OBJECTIVE:   DIAGNOSTIC FINDINGS:   Narrative & Impression  CLINICAL DATA:  Mixed dementia   EXAM: MRI HEAD WITHOUT CONTRAST   TECHNIQUE: Multiplanar, multiecho pulse sequences of the brain and surrounding structures were obtained without intravenous contrast.   COMPARISON:  None.   FINDINGS: Brain: No acute infarct, mass effect or extra-axial collection. No acute or chronic hemorrhage. There is multifocal hyperintense T2-weighted signal within the white matter. Generalized volume loss without a clear lobar predilection. The midline structures are normal. Old left cerebellar infarct.   Vascular: Major flow voids are preserved.   Skull and upper cervical spine: Normal calvarium and skull base. Visualized upper cervical spine and soft tissues are normal.   Sinuses/Orbits:No paranasal sinus fluid levels or advanced mucosal thickening. No mastoid or middle ear effusion. Normal orbits.   IMPRESSION: 1. No acute intracranial abnormality. 2. Findings of chronic small vessel ischemia and old left cerebellar infarct. 3. Generalized volume loss in a nonspecific pattern. Quantitative, volumetric MRI of the brain may be helpful for more specific evaluation for characteristic atrophy patterns associated with dementia.     Electronically Signed   By: Ulyses Jarred M.D.   On: 11/02/2020 23:42     COGNITION: Overall cognitive status: History of cognitive impairments - at baseline   SENSATION: WFL  COORDINATION: Slow to respond at times  EDEMA:  None observed  MUSCLE TONE: Normal throughout BUE/LE's   POSTURE: rounded shoulders and forward head  LOWER EXTREMITY ROM:     Active  Right Eval Left Eval  Hip flexion 3+ 3+  Hip extension 4 4  Hip abduction 4 4  Hip adduction 4 4  Hip internal rotation 4 4  Hip external rotation 4 4  Knee flexion 4 4  Knee  extension 4 4  Ankle dorsiflexion 4 4  Ankle plantarflexion    Ankle inversion    Ankle eversion     (Blank rows = not tested)  LOWER EXTREMITY ROM  MMT Right Eval Left Eval  Hip flexion WNL  WNL  Hip extension WNL WNL  Hip abduction WNL WNL  Hip adduction WNL WNL  Hip internal rotation WNL WNL  Hip external rotation WNL WNL  Knee flexion WNL WNL  Knee extension WNL WNL  Ankle dorsiflexion WNL WNL  Ankle plantarflexion WNL WNL  Ankle inversion WNL WNL  Ankle eversion WNL WNL  (Blank rows = not tested)    TRANSFERS: Assistive device utilized: None  Sit to stand: Complete Independence Stand to sit: Complete Independence Chair to chair: Complete Independence Floor:  not  tested   GAIT: Gait pattern: decreased step length- Right, decreased step length- Left, and shuffling Distance walked: 150 feet Assistive device utilized: None Level of assistance: SBA Comments: Mild unsteadiness with 180 deg turning and shuffling gait noted  FUNCTIONAL TESTS:  5 times sit to stand: 18.89 sec without UE Support Timed up and go (TUG): 17.23 sec without an AD Berg Balance Scale: 45/56  PATIENT SURVEYS:  FOTO 56  TODAY'S TREATMENT:                                                                                                                              DATE:  10/23/2022  BP (sitting) right UE prior to session= 153/58 mmHg BP (Standing) right UE prior to session= 141/60 mmHGg   NMR:   Tandem stand on airex pad- (attempt to hold without UE support)- x 15-30 sec x 3  Static stand on airex pad with horizontal and vertical head turns x 10 reps with occasional UE support.  Dynamic step tap on airex pad x 20 reps   Obstacle course: Stepping onto airex beam in // bars then walk across and up/over orange hurdle; diagonal step up/over 1/2 foam- then up onto airex pad- step over shoe box; step onto bottom of rocker then to opp side - weight shift til rocker shifts and then step down x 4  (total of 200 feet)    Recheck BP= 160/64 mmHg   Side step on airex beam without UE support  x 5 (left to right then back to left.   Forward step over airex pad without UE support  x15   Retro gait in gym without any UE support- Mild lateral Drift x 10 feet and back x 4.                PATIENT EDUCATION: Education details: purpose of PT and how this service may assist in improving her balance Person educated: Patient, Child(ren), and [name] Education method: Explanation, Demonstration, Tactile cues, and Verbal cues Education comprehension: verbalized understanding, returned demonstration, verbal cues required, tactile cues required, and needs further education  HOME EXERCISE PROGRAM: Access Code: [email or web address] URL: [email or web address] Date: 09/11/2022 Prepared by: Sande Brothers  Exercises - Seated Hip Flexion March with Ankle Weights  - 1 x daily - 3 x weekly - 3 sets - 10 reps - Seated Hip Abduction with Resistance  - 1 x daily - 3 x weekly - 3 sets - 10 reps - Seated Hip Adduction Isometrics with Ball  - 1 x daily - 3 x weekly - 3 sets - 10 reps - Seated Long Arc Quad with Ankle Weight  - 1 x daily - 3 x weekly - 3 sets - 10 reps - Seated Hamstring Curl with Anchored Resistance  - 1 x daily - 3 x weekly - 3 sets - 10 reps - Seated Heel Raise  - 1 x daily - 3 x weekly - 3 sets -  10 reps - Seated Heel Toe Raises  - 1 x daily - 3 x weekly - 3 sets - 10 reps - Sit to Stand with Arms Crossed  - 1 x daily - 3 x weekly - 3 sets - 10 reps  GOALS: Goals reviewed with patient? Yes  SHORT TERM GOALS: Target date: 10/18/2022  Pt and caregiver (daughter) will be independent with HEP in order to improve strength and balance in order to decrease fall risk and improve function at home and work Baseline: EVAL- Patient has no formal HEP and admits to not exercising.  Goal status: INITIAL   LONG TERM GOALS: Target date: 11/29/2022  Pt will improve FOTO  to target score  of > 57 to display perceived improvements in ability to complete ADL's.  Baseline: EVAL= 56  Goal status: INITIAL  2.  Pt will decrease 5TSTS by at least 3 seconds in order to demonstrate clinically significant improvement in LE strength. Baseline: EVAL=18.89 sec without UE support Goal status: INITIAL  3.  Pt will decrease TUG to below 14 seconds/decrease in order to demonstrate decreased fall risk.  Baseline: EVAL= 17.23 sec without UE support Goal status: INITIAL  4.  Pt will improve BERG by at least 3 points in order to demonstrate clinically significant improvement in balance.  Baseline: EVAL=45/56 Goal status: INITIAL  5.  Patient will deny any falls during episode of care for optimal safety with all household and community distances  Baseline: EVAL= Unsteady yet no reported falls in last 6 months.  Goal status: INITIAL    ASSESSMENT:  CLINICAL IMPRESSION: Treatment continued with focus on balance to achieve her goal of improved functional mobility and decreased risk of falling. Patient was challenged with walking on unstable surfaces; side stepping and most difficulty with retro stepping today requiring close CGA. She was fatigued with obstacle course activities today yet able to improve with VC and practice.  Pt will continue to benefit from skilled PT services to address balance impairments to reduce falls risk.     OBJECTIVE IMPAIRMENTS: Abnormal gait, decreased balance, decreased cognition, decreased coordination, decreased mobility, difficulty walking, and decreased strength.   ACTIVITY LIMITATIONS: carrying, lifting, and stairs  PARTICIPATION LIMITATIONS: cleaning, laundry, shopping, community activity, and yard work  PERSONAL FACTORS: 1 comorbidity: dementia  are also affecting patient's functional outcome.   REHAB POTENTIAL: Good  CLINICAL DECISION MAKING: Evolving/moderate complexity  EVALUATION COMPLEXITY: Low  PLAN:  PT FREQUENCY:  1-2x/week  PT DURATION: 12 weeks  PLANNED INTERVENTIONS: Therapeutic exercises, Therapeutic activity, Neuromuscular re-education, Balance training, Gait training, Patient/Family education, Self Care, Joint mobilization, Stair training, Vestibular training, Canalith repositioning, DME instructions, Cryotherapy, Moist heat, and Manual therapy  PLAN FOR NEXT SESSION: Continue with LE strengthening and balance training as appropriate.     Ollen Bowl, PT Physical Therapist- [facility]  10/23/2022, 1:08 PM+

## 2022-10-23 ENCOUNTER — Ambulatory Visit: Payer: Medicare Other | Admitting: Speech Pathology

## 2022-10-23 ENCOUNTER — Ambulatory Visit: Payer: Medicare Other

## 2022-10-23 DIAGNOSIS — R278 Other lack of coordination: Secondary | ICD-10-CM

## 2022-10-23 DIAGNOSIS — R41841 Cognitive communication deficit: Secondary | ICD-10-CM

## 2022-10-23 DIAGNOSIS — R262 Difficulty in walking, not elsewhere classified: Secondary | ICD-10-CM

## 2022-10-23 DIAGNOSIS — R269 Unspecified abnormalities of gait and mobility: Secondary | ICD-10-CM

## 2022-10-23 DIAGNOSIS — M6281 Muscle weakness (generalized): Secondary | ICD-10-CM

## 2022-10-25 ENCOUNTER — Encounter: Payer: Medicare Other | Admitting: Speech Pathology

## 2022-10-25 ENCOUNTER — Ambulatory Visit: Payer: Medicare Other

## 2022-10-25 DIAGNOSIS — R262 Difficulty in walking, not elsewhere classified: Secondary | ICD-10-CM | POA: Diagnosis not present

## 2022-10-25 DIAGNOSIS — M6281 Muscle weakness (generalized): Secondary | ICD-10-CM

## 2022-10-25 DIAGNOSIS — R269 Unspecified abnormalities of gait and mobility: Secondary | ICD-10-CM

## 2022-10-25 DIAGNOSIS — R41841 Cognitive communication deficit: Secondary | ICD-10-CM

## 2022-10-25 DIAGNOSIS — R278 Other lack of coordination: Secondary | ICD-10-CM

## 2022-10-25 NOTE — Therapy (Signed)
OUTPATIENT PHYSICAL THERAPY NEURO TREATMENT   Patient Name: Jeanette Newton MRN: [id number] DOB:November 28, 1927, 89 y.o., female Today's Date: 10/25/2022   PCP: Dr. Emily Filbert REFERRING PROVIDER: Dr. Jennings Books  END OF SESSION:  PT End of Session - 10/25/22 0935     Visit Number 15    Number of Visits 24    Date for PT Re-Evaluation 11/29/22    Progress Note Due on Visit 20    PT Start Time 0930    Equipment Utilized During Treatment Gait belt    Activity Tolerance Patient tolerated treatment well    Behavior During Therapy [facility] for tasks assessed/performed                Past Medical History:  Diagnosis Date   Cancer ([place]) 2005-2006   lymphoma   Hypothyroidism    Status post chemoradiation    lymphoma   Status post radiation therapy    Thyroid disease    Wears hearing aid in both ears    Past Surgical History:  Procedure Laterality Date   ABDOMINAL EXPLORATION SURGERY  2005-2006   Dr Pat Patrick   CATARACT EXTRACTION [facility] Left 07/28/2019   Procedure: CATARACT EXTRACTION PHACO AND INTRAOCULAR LENS PLACEMENT ([place]) LEFT 7.83  00:53.4;  Surgeon: Birder Robson, MD;  Location: [place];  Service: Ophthalmology;  Laterality: Left;   CATARACT EXTRACTION W/PHACO Right 08/25/2019   Procedure: CATARACT EXTRACTION PHACO AND INTRAOCULAR LENS PLACEMENT ([facility]) RIGHT 6.04 00:41.0;  Surgeon: Birder Robson, MD;  Location: [place];  Service: Ophthalmology;  Laterality: Right;   CHOLECYSTECTOMY N/A 05/25/2016   Procedure: LAPAROSCOPIC CHOLECYSTECTOMY WITH INTRAOPERATIVE CHOLANGIOGRAM;  Surgeon: Robert Bellow, MD;  Location: [facility];  Service: General;  Laterality: N/A;   ELBOW FRACTURE SURGERY Right    EYE SURGERY     LAPAROTOMY N/A 05/30/2019   Procedure: EXPLORATORY LAPAROTOMYand small bowel resection;  Surgeon: Jules Husbands, MD;  Location: [facility];  Service: General;  Laterality: N/A;   XI ROBOTIC ASSISTED VENTRAL HERNIA N/A 11/06/2019   Procedure:  XI ROBOTIC ASSISTED VENTRAL HERNIA REPAIR WITH MESH;  Surgeon: Jules Husbands, MD;  Location: [facility];  Service: General;  Laterality: N/A;   Patient Active Problem List   Diagnosis Date Noted   Bowel perforation ([place]) 05/30/2019   Calculus of gallbladder with acute cholecystitis without obstruction 05/15/2016    ONSET DATE: Over 6 months  REFERRING DIAG: R26.89 (ICD-10-CM) - Imbalance   THERAPY DIAG:  Difficulty in walking, not elsewhere classified  Muscle weakness (generalized)  Abnormality of gait and mobility  Other lack of coordination  Cognitive communication deficit  Rationale for Evaluation and Treatment: Rehabilitation  SUBJECTIVE:  SUBJECTIVE STATEMENT: Patient reports doing well. Dtr. Reports she is taking her to get her leg checked out right after her PT appointment this am.  Pt accompanied by: family member-[name]  PERTINENT HISTORY: Patient is a 89 year old female with Mixed Alzheimers and vascular dementia who lives alone yet daughter stays with her during the day. Reports does not drive and not happy about that yet has been independent with all ADLs- some imbalance however daughter denies any recent falls.   PAIN:  Are you having pain? No  PRECAUTIONS: Fall  WEIGHT BEARING RESTRICTIONS: No  FALLS: Has patient fallen in last 6 months? No  LIVING ENVIRONMENT: Lives with: lives alone and daughter is in and out and lives 2 mi away Lives in: House/apartment Stairs: Yes: Internal: 15 steps; on left going up Has following equipment at home: Single point cane, Walker - 2 wheeled, and Grab bars  PLOF: Independent with basic ADLs, Independent with gait, and Needs assistance with homemaking  PATIENT GOALS: Caregiver goal- is to improve her stability and keep her safe in her home.2  OBJECTIVE:   DIAGNOSTIC FINDINGS:   Narrative & Impression  CLINICAL DATA:  Mixed dementia   EXAM: MRI  HEAD WITHOUT CONTRAST   TECHNIQUE: Multiplanar, multiecho pulse sequences of the brain and surrounding structures were obtained without intravenous contrast.   COMPARISON:  None.   FINDINGS: Brain: No acute infarct, mass effect or extra-axial collection. No acute or chronic hemorrhage. There is multifocal hyperintense T2-weighted signal within the white matter. Generalized volume loss without a clear lobar predilection. The midline structures are normal. Old left cerebellar infarct.   Vascular: Major flow voids are preserved.   Skull and upper cervical spine: Normal calvarium and skull base. Visualized upper cervical spine and soft tissues are normal.   Sinuses/Orbits:No paranasal sinus fluid levels or advanced mucosal thickening. No mastoid or middle ear effusion. Normal orbits.   IMPRESSION: 1. No acute intracranial abnormality. 2. Findings of chronic small vessel ischemia and old left cerebellar infarct. 3. Generalized volume loss in a nonspecific pattern. Quantitative, volumetric MRI of the brain may be helpful for more specific evaluation for characteristic atrophy patterns associated with dementia.     Electronically Signed   By: Ulyses Jarred M.D.   On: 11/02/2020 23:42     COGNITION: Overall cognitive status: History of cognitive impairments - at baseline   SENSATION: WFL  COORDINATION: Slow to respond at times  EDEMA:  None observed  MUSCLE TONE: Normal throughout BUE/LE's   POSTURE: rounded shoulders and forward head  LOWER EXTREMITY ROM:     Active  Right Eval Left Eval  Hip flexion 3+ 3+  Hip extension 4 4  Hip abduction 4 4  Hip adduction 4 4  Hip internal rotation 4 4  Hip external rotation 4 4  Knee flexion 4 4  Knee extension 4 4  Ankle dorsiflexion 4 4  Ankle plantarflexion    Ankle inversion    Ankle eversion     (Blank rows = not tested)  LOWER EXTREMITY ROM  MMT Right Eval Left Eval  Hip flexion WNL  WNL  Hip  extension WNL WNL  Hip abduction WNL WNL  Hip adduction WNL WNL  Hip internal rotation WNL WNL  Hip external rotation WNL WNL  Knee flexion WNL WNL  Knee extension WNL WNL  Ankle dorsiflexion WNL WNL  Ankle plantarflexion WNL WNL  Ankle inversion WNL WNL  Ankle eversion WNL WNL  (Blank rows = not tested)    TRANSFERS: Assistive device utilized: None  Sit to stand: Complete Independence Stand to sit: Complete Independence Chair to chair: Complete Independence Floor:  not  tested   GAIT: Gait pattern: decreased step length- Right, decreased step length- Left, and shuffling Distance walked: 150 feet Assistive device utilized: None Level of assistance: SBA Comments: Mild unsteadiness with 180 deg turning and shuffling gait noted  FUNCTIONAL TESTS:  5 times sit to stand: 18.89 sec without UE Support Timed up and go (TUG): 17.23 sec without an AD Berg Balance Scale: 45/56  PATIENT SURVEYS:  FOTO 56  TODAY'S TREATMENT:                                                                                                                              DATE:  10/23/2022    THEREX  Nustep LE only for 5 min at L2- for mm strength and cardio   BP (sit) after Nustep= 128/40mHg   Standing walking lunges- down and back in // bars x 2 Side step squat down and back in // bars- x 2  Toe walk (heels up) and walk down and back in //bars x 2 Sit to stand with UE holding rainbow ball x 12 reps Resistive Hip flex (RTB tied from one bar to the other) x 15 reps each LE Resistive Hip ext (GTB tied from one bar to the other) x 15 reps each LE Resistive Hip abd (GTB tied from one bar to the other)     Pt educated throughout session about proper posture and technique with exercises. Improved exercise technique, movement at target joints, use of target muscles after min to mod verbal, visual, tactile cues.        PATIENT EDUCATION: Education details: purpose of PT and how this service may  assist in improving her balance Person educated: Patient, Child(ren), and [name] Education method: Explanation, Demonstration, Tactile cues, and Verbal cues Education comprehension: verbalized understanding, returned demonstration, verbal cues required, tactile cues required, and needs further education  HOME EXERCISE PROGRAM: Access Code: [email or web address]: [email or web address] Date: 09/11/2022 Prepared by: JSande Brothers Exercises - Seated Hip Flexion March with Ankle Weights  - 1 x daily - 3 x weekly - 3 sets - 10 reps - Seated Hip Abduction with Resistance  - 1 x daily - 3 x weekly - 3 sets - 10 reps - Seated Hip Adduction Isometrics with Ball  - 1 x daily - 3 x weekly - 3 sets - 10 reps - Seated Long Arc Quad with Ankle Weight  - 1 x daily - 3 x weekly - 3 sets - 10 reps - Seated Hamstring Curl with Anchored Resistance  - 1 x daily - 3 x weekly - 3 sets - 10 reps - Seated Heel Raise  - 1 x daily - 3 x weekly - 3 sets - 10 reps - Seated Heel Toe Raises  - 1 x daily - 3 x weekly - 3 sets - 10 reps - Sit to Stand with Arms Crossed  - 1 x daily - 3 x weekly - 3 sets -  10 reps  GOALS: Goals reviewed with patient? Yes  SHORT TERM GOALS: Target date: 10/18/2022  Pt and caregiver (daughter) will be independent with HEP in order to improve strength and balance in order to decrease fall risk and improve function at home and work Baseline: EVAL- Patient has no formal HEP and admits to not exercising.  Goal status: INITIAL   LONG TERM GOALS: Target date: 11/29/2022  Pt will improve FOTO to target score  of > 57 to display perceived improvements in ability to complete ADL's.  Baseline: EVAL= 56  Goal status: INITIAL  2.  Pt will decrease 5TSTS by at least 3 seconds in order to demonstrate clinically significant improvement in LE strength. Baseline: EVAL=18.89 sec without UE support Goal status: INITIAL  3.  Pt will decrease TUG to below 14 seconds/decrease in  order to demonstrate decreased fall risk.  Baseline: EVAL= 17.23 sec without UE support Goal status: INITIAL  4.  Pt will improve BERG by at least 3 points in order to demonstrate clinically significant improvement in balance.  Baseline: EVAL=45/56 Goal status: INITIAL  5.  Patient will deny any falls during episode of care for optimal safety with all household and community distances  Baseline: EVAL= Unsteady yet no reported falls in last 6 months.  Goal status: INITIAL    ASSESSMENT:  CLINICAL IMPRESSION: Patient progressed well overall with LE strengthening and able to perform well with standing and resistive exercises with fatigue as only limiting factor. She was able to follow all VC well without difficulty and complete prescribed reps with good control and no significant difficulty with Left LE.   Pt will continue to benefit from skilled PT services to address balance impairments to reduce falls risk.     OBJECTIVE IMPAIRMENTS: Abnormal gait, decreased balance, decreased cognition, decreased coordination, decreased mobility, difficulty walking, and decreased strength.   ACTIVITY LIMITATIONS: carrying, lifting, and stairs  PARTICIPATION LIMITATIONS: cleaning, laundry, shopping, community activity, and yard work  PERSONAL FACTORS: 1 comorbidity: dementia  are also affecting patient's functional outcome.   REHAB POTENTIAL: Good  CLINICAL DECISION MAKING: Evolving/moderate complexity  EVALUATION COMPLEXITY: Low  PLAN:  PT FREQUENCY: 1-2x/week  PT DURATION: 12 weeks  PLANNED INTERVENTIONS: Therapeutic exercises, Therapeutic activity, Neuromuscular re-education, Balance training, Gait training, Patient/Family education, Self Care, Joint mobilization, Stair training, Vestibular training, Canalith repositioning, DME instructions, Cryotherapy, Moist heat, and Manual therapy  PLAN FOR NEXT SESSION: Continue with LE strengthening and balance training as appropriate.     Ollen Bowl, PT Physical Therapist- [facility]  10/25/2022, 9:36 AM+

## 2022-10-29 NOTE — Therapy (Signed)
OUTPATIENT PHYSICAL THERAPY NEURO TREATMENT   Patient Name: Jeanette Newton MRN: [id number] DOB:[birth year], 89 y.o., female Today's Date: 10/31/2022   PCP: Dr. Emily Filbert REFERRING PROVIDER: Dr. Jennings Books  END OF SESSION:  PT End of Session - 10/30/22 0938     Visit Number 16    Number of Visits 24    Date for PT Re-Evaluation 11/29/22    Progress Note Due on Visit 20    PT Start Time 0933    PT Stop Time 1014    PT Time Calculation (min) 41 min    Equipment Utilized During Treatment Gait belt    Activity Tolerance Patient tolerated treatment well    Behavior During Therapy [facility] for tasks assessed/performed                Past Medical History:  Diagnosis Date   Cancer ([place]) 2005-2006   lymphoma   Hypothyroidism    Status post chemoradiation    lymphoma   Status post radiation therapy    Thyroid disease    Wears hearing aid in both ears    Past Surgical History:  Procedure Laterality Date   ABDOMINAL EXPLORATION SURGERY  2005-2006   Dr Pat Patrick   CATARACT EXTRACTION [facility] Left 07/28/2019   Procedure: CATARACT EXTRACTION PHACO AND INTRAOCULAR LENS PLACEMENT ([place]) LEFT 7.83  00:53.4;  Surgeon: Birder Robson, MD;  Location: [place];  Service: Ophthalmology;  Laterality: Left;   CATARACT EXTRACTION W/PHACO Right 08/25/2019   Procedure: CATARACT EXTRACTION PHACO AND INTRAOCULAR LENS PLACEMENT ([facility]) RIGHT 6.04 00:41.0;  Surgeon: Birder Robson, MD;  Location: [place];  Service: Ophthalmology;  Laterality: Right;   CHOLECYSTECTOMY N/A 05/25/2016   Procedure: LAPAROSCOPIC CHOLECYSTECTOMY WITH INTRAOPERATIVE CHOLANGIOGRAM;  Surgeon: Robert Bellow, MD;  Location: [facility];  Service: General;  Laterality: N/A;   ELBOW FRACTURE SURGERY Right    EYE SURGERY     LAPAROTOMY N/A 05/30/2019   Procedure: EXPLORATORY LAPAROTOMYand small bowel resection;  Surgeon: Jules Husbands, MD;  Location: [facility];  Service: General;  Laterality: N/A;   XI  ROBOTIC ASSISTED VENTRAL HERNIA N/A 11/06/2019   Procedure: XI ROBOTIC ASSISTED VENTRAL HERNIA REPAIR WITH MESH;  Surgeon: Jules Husbands, MD;  Location: [facility];  Service: General;  Laterality: N/A;   Patient Active Problem List   Diagnosis Date Noted   Bowel perforation ([place]) 05/30/2019   Calculus of gallbladder with acute cholecystitis without obstruction 05/15/2016    ONSET DATE: Over 6 months  REFERRING DIAG: R26.89 (ICD-10-CM) - Imbalance   THERAPY DIAG:  Difficulty in walking, not elsewhere classified  Muscle weakness (generalized)  Abnormality of gait and mobility  Other lack of coordination  Rationale for Evaluation and Treatment: Rehabilitation  SUBJECTIVE:  SUBJECTIVE STATEMENT: Patient reports doing okay with no more falls. [name]- dtr reports MD stated not sure about the knot on her right LE states maybe a blood clot that may take time to heal. She reports pallititve care comes by tomorrow and she will ask them to take a look.  Pt accompanied by: family member-[name]  PERTINENT HISTORY: Patient is a 89 year old female with Mixed Alzheimers and vascular dementia who lives alone yet daughter stays with her during the day. Reports does not drive and not happy about that yet has been independent with all ADLs- some imbalance however daughter denies any recent falls.   PAIN:  Are you having pain? No  PRECAUTIONS: Fall  WEIGHT BEARING RESTRICTIONS: No  FALLS: Has patient fallen in last 6 months? No  LIVING ENVIRONMENT: Lives with: lives alone and daughter is in and out and lives 2 mi away Lives in: House/apartment Stairs: Yes: Internal: 15 steps; on left going up Has following equipment at home: Single point cane, Walker - 2 wheeled, and Grab bars  PLOF: Independent with basic ADLs, Independent with gait, and Needs assistance with homemaking  PATIENT GOALS: Caregiver goal- is to improve her  stability and keep her safe in her home.2  OBJECTIVE:   DIAGNOSTIC FINDINGS:   Narrative & Impression  CLINICAL DATA:  Mixed dementia   EXAM: MRI HEAD WITHOUT CONTRAST   TECHNIQUE: Multiplanar, multiecho pulse sequences of the brain and surrounding structures were obtained without intravenous contrast.   COMPARISON:  None.   FINDINGS: Brain: No acute infarct, mass effect or extra-axial collection. No acute or chronic hemorrhage. There is multifocal hyperintense T2-weighted signal within the white matter. Generalized volume loss without a clear lobar predilection. The midline structures are normal. Old left cerebellar infarct.   Vascular: Major flow voids are preserved.   Skull and upper cervical spine: Normal calvarium and skull base. Visualized upper cervical spine and soft tissues are normal.   Sinuses/Orbits:No paranasal sinus fluid levels or advanced mucosal thickening. No mastoid or middle ear effusion. Normal orbits.   IMPRESSION: 1. No acute intracranial abnormality. 2. Findings of chronic small vessel ischemia and old left cerebellar infarct. 3. Generalized volume loss in a nonspecific pattern. Quantitative, volumetric MRI of the brain may be helpful for more specific evaluation for characteristic atrophy patterns associated with dementia.     Electronically Signed   By: Ulyses Jarred M.D.   On: 11/02/2020 23:42     COGNITION: Overall cognitive status: History of cognitive impairments - at baseline   SENSATION: WFL  COORDINATION: Slow to respond at times  EDEMA:  None observed  MUSCLE TONE: Normal throughout BUE/LE's   POSTURE: rounded shoulders and forward head  LOWER EXTREMITY ROM:     Active  Right Eval Left Eval  Hip flexion 3+ 3+  Hip extension 4 4  Hip abduction 4 4  Hip adduction 4 4  Hip internal rotation 4 4  Hip external rotation 4 4  Knee flexion 4 4  Knee extension 4 4  Ankle dorsiflexion 4 4  Ankle plantarflexion     Ankle inversion    Ankle eversion     (Blank rows = not tested)  LOWER EXTREMITY ROM  MMT Right Eval Left Eval  Hip flexion WNL  WNL  Hip extension WNL WNL  Hip abduction WNL WNL  Hip adduction WNL WNL  Hip internal rotation WNL WNL  Hip external rotation WNL WNL  Knee flexion WNL WNL  Knee extension WNL WNL  Ankle dorsiflexion WNL WNL  Ankle plantarflexion WNL WNL  Ankle inversion WNL WNL  Ankle eversion WNL WNL  (Blank rows = not tested)    TRANSFERS: Assistive device utilized: None  Sit to stand: Complete Independence Stand to sit: Complete Independence Chair to chair: Complete Independence Floor:  not  tested   GAIT: Gait pattern: decreased step length- Right, decreased step length- Left, and shuffling Distance walked: 150 feet Assistive device utilized: None Level of assistance: SBA Comments: Mild unsteadiness with 180 deg turning and shuffling gait noted  FUNCTIONAL TESTS:  5 times sit to stand: 18.89 sec without UE Support Timed up and go (TUG): 17.23 sec without an AD Berg Balance Scale: 45/56  PATIENT SURVEYS:  FOTO 56  TODAY'S TREATMENT:                                                                                                                              DATE:  10/30/2022         Step tap onto 1st step without UE support x 15 reps- No observed unsteadiness Step up onto 1st step alt LE x 12 reps each LE Seated Side step over spike1/2 ball x 12 reps each LE  Dynamic marching/walk x 15 feet down and back x3 Dynamic butt kick/walk x 15 feet down and back x 3 Side stepping/walk x 15 feet down and back x 3 Ambulation through narrow passageway (8 feet) between the cones- down and back x2   Dynamic 360 deg turning from initial larger diameter circles- progressively decreasing circumference with each revolution x 4 total- no touching of cones or LOB  Dynamic walking in hallway with horizontal head turns- calling out sticky notes- some  unsteadiness with lateral gait deviation toward side she is looking to.   Pt educated throughout session about proper posture and technique with exercises. Improved exercise technique, movement at target joints, use of target muscles after min to mod verbal, visual, tactile cues.        PATIENT EDUCATION: Education details: purpose of PT and how this service may assist in improving her balance Person educated: Patient, Child(ren), and [name] Education method: Explanation, Demonstration, Tactile cues, and Verbal cues Education comprehension: verbalized understanding, returned demonstration, verbal cues required, tactile cues required, and needs further education  HOME EXERCISE PROGRAM: Access Code: [email or web address] URL: [email or web address] Date: 09/11/2022 Prepared by: Sande Brothers  Exercises - Seated Hip Flexion March with Ankle Weights  - 1 x daily - 3 x weekly - 3 sets - 10 reps - Seated Hip Abduction with Resistance  - 1 x daily - 3 x weekly - 3 sets - 10 reps - Seated Hip Adduction Isometrics with Ball  - 1 x daily - 3 x weekly - 3 sets - 10 reps - Seated Long Arc Quad with Ankle Weight  - 1 x daily - 3 x weekly - 3 sets - 10 reps - Seated Hamstring Curl with Anchored Resistance  - 1 x daily - 3 x weekly - 3 sets - 10 reps - Seated Heel Raise  - 1 x daily - 3 x weekly - 3 sets - 10 reps - Seated Heel Toe Raises  - 1 x daily - 3 x weekly - 3 sets - 10  reps - Sit to Stand with Arms Crossed  - 1 x daily - 3 x weekly - 3 sets - 10 reps  GOALS: Goals reviewed with patient? Yes  SHORT TERM GOALS: Target date: 10/18/2022  Pt and caregiver (daughter) will be independent with HEP in order to improve strength and balance in order to decrease fall risk and improve function at home and work Baseline: EVAL- Patient has no formal HEP and admits to not exercising.  Goal status: INITIAL   LONG TERM GOALS: Target date: 11/29/2022  Pt will improve FOTO to target  score  of > 57 to display perceived improvements in ability to complete ADL's.  Baseline: EVAL= 56  Goal status: INITIAL  2.  Pt will decrease 5TSTS by at least 3 seconds in order to demonstrate clinically significant improvement in LE strength. Baseline: EVAL=18.89 sec without UE support Goal status: INITIAL  3.  Pt will decrease TUG to below 14 seconds/decrease in order to demonstrate decreased fall risk.  Baseline: EVAL= 17.23 sec without UE support Goal status: INITIAL  4.  Pt will improve BERG by at least 3 points in order to demonstrate clinically significant improvement in balance.  Baseline: EVAL=45/56 Goal status: INITIAL  5.  Patient will deny any falls during episode of care for optimal safety with all household and community distances  Baseline: EVAL= Unsteady yet no reported falls in last 6 months.  Goal status: INITIAL    ASSESSMENT:  CLINICAL IMPRESSION: Treatment continued per plan of care focusing more on her balance today. She responded very well - able to walk and perform several dynamic activities without LOB or use of any support. She is more limited by impaired cognition but was challenged with horizontal head turning and will remain an item of focus in future visits. She presents with good endurance with minimal rest break today.   Pt will continue to benefit from skilled PT services to address balance impairments to reduce falls risk.     OBJECTIVE IMPAIRMENTS: Abnormal gait, decreased balance, decreased cognition, decreased coordination, decreased mobility, difficulty walking, and decreased strength.   ACTIVITY LIMITATIONS: carrying, lifting, and stairs  PARTICIPATION LIMITATIONS: cleaning, laundry, shopping, community activity, and yard work  PERSONAL FACTORS: 1 comorbidity: dementia  are also affecting patient's functional outcome.   REHAB POTENTIAL: Good  CLINICAL DECISION MAKING: Evolving/moderate complexity  EVALUATION COMPLEXITY:  Low  PLAN:  PT FREQUENCY: 1-2x/week  PT DURATION: 12 weeks  PLANNED INTERVENTIONS: Therapeutic exercises, Therapeutic activity, Neuromuscular re-education, Balance training, Gait training, Patient/Family education, Self Care, Joint mobilization, Stair training, Vestibular training, Canalith repositioning, DME instructions, Cryotherapy, Moist heat, and Manual therapy  PLAN FOR NEXT SESSION: Continue with LE strengthening and balance training as appropriate.     Ollen Bowl, PT Physical Therapist- [facility]  10/31/2022, 1:11 PM+

## 2022-10-30 ENCOUNTER — Ambulatory Visit: Payer: Medicare Other | Attending: Neurology

## 2022-10-30 ENCOUNTER — Ambulatory Visit: Payer: Medicare Other | Admitting: Speech Pathology

## 2022-10-30 DIAGNOSIS — R278 Other lack of coordination: Secondary | ICD-10-CM | POA: Diagnosis present

## 2022-10-30 DIAGNOSIS — M6281 Muscle weakness (generalized): Secondary | ICD-10-CM | POA: Diagnosis present

## 2022-10-30 DIAGNOSIS — R269 Unspecified abnormalities of gait and mobility: Secondary | ICD-10-CM | POA: Insufficient documentation

## 2022-10-30 DIAGNOSIS — R262 Difficulty in walking, not elsewhere classified: Secondary | ICD-10-CM

## 2022-10-30 DIAGNOSIS — R41841 Cognitive communication deficit: Secondary | ICD-10-CM | POA: Insufficient documentation

## 2022-10-31 ENCOUNTER — Other Ambulatory Visit: Payer: Medicare Other

## 2022-10-31 VITALS — BP 116/58 | HR 65 | Temp 97.4°F | Wt 130.0 lb

## 2022-10-31 DIAGNOSIS — Z515 Encounter for palliative care: Secondary | ICD-10-CM

## 2022-10-31 NOTE — Progress Notes (Signed)
PATIENT NAME: Jeanette Newton DOB: [birth year] MRN: [id number]  PRIMARY CARE PROVIDER: Rusty Aus, MD  RESPONSIBLE PARTY:  Acct ID - Guarantor Home Phone Work Phone Relationship Acct Type  [id number] - [place]son,ER* [phone]  Self P/F     [address] [place], [place], [place] [address]    Home visit completed with patient and daughter Jeanette Newton.  Edema:  1+ ankle edema.  Continues using diuretic as needed.   Hematoma:  Right lower leg.  1 inch nodule present.  No redness, warmth or bruising noted.  Patient reports tenderness has improved.   Has not required antibiotics.  Outpatient Therapy:  Patient continues with outpatient PT.  Will be reassessed likely first of April.  Patient feels this has been helpful and feels stronger.   Syncopal Episode:  Daughter reported patient "passed out" at [facility].  Daughter was able to lower her to the ground and no injuries were reported.  Patient was evaluated by EMT and did not need ED evaluation.  Daughter advised EMT felt patient's blood pressure dropped during the episode.  Daughter also advised patient does not drink liquids well.  She continues to encourage fluid intake to decrease risk of dehydration.    Weight loss:  Obtained weight today.  Down 3 lbs from last month.  No changes in appetite reported by patient or daughter.  Has not started mirtazapine due to concern over side-effects.  Has medication in the home.       CODE STATUS: DNR ADVANCED DIRECTIVES: Yes MOST FORM: No PPS: 50%   PHYSICAL EXAM:   VITALS: Today's Vitals   10/31/22 1021  BP: (!) 116/58  Pulse: 65  Temp: (!) 97.4 F (36.3 C)  SpO2: 98%  Weight: 130 lb (59 kg)    LUNGS: clear to auscultation  CARDIAC: Cor RRR}  EXTREMITIES: 1+  SKIN: Skin color, texture, turgor normal. No rashes or lesions or normal  NEURO: positive for memory problems       Lorenza Burton, RN

## 2022-11-01 ENCOUNTER — Ambulatory Visit: Payer: Medicare Other

## 2022-11-01 ENCOUNTER — Encounter: Payer: Medicare Other | Admitting: Speech Pathology

## 2022-11-01 DIAGNOSIS — R278 Other lack of coordination: Secondary | ICD-10-CM

## 2022-11-01 DIAGNOSIS — R269 Unspecified abnormalities of gait and mobility: Secondary | ICD-10-CM

## 2022-11-01 DIAGNOSIS — M6281 Muscle weakness (generalized): Secondary | ICD-10-CM

## 2022-11-01 DIAGNOSIS — R262 Difficulty in walking, not elsewhere classified: Secondary | ICD-10-CM

## 2022-11-01 NOTE — Therapy (Signed)
OUTPATIENT PHYSICAL THERAPY NEURO TREATMENT   Patient Name: Jeanette Newton MRN: [id number] DOB:01-14-1928, 89 y.o., female Today's Date: 11/01/2022   PCP: Dr. Emily Filbert REFERRING PROVIDER: Dr. Jennings Books  END OF SESSION:  PT End of Session - 11/01/22 0935     Visit Number 17    Number of Visits 24    Date for PT Re-Evaluation 11/29/22    Authorization Type [place] Time Period 09/06/22-11/29/22    Progress Note Due on Visit 20    PT Start Time 0932    PT Stop Time 1012    PT Time Calculation (min) 40 min    Equipment Utilized During Treatment Gait belt    Activity Tolerance Patient tolerated treatment well;No increased pain                Past Medical History:  Diagnosis Date   Cancer ([place]) 2005-2006   lymphoma   Hypothyroidism    Status post chemoradiation    lymphoma   Status post radiation therapy    Thyroid disease    Wears hearing aid in both ears    Past Surgical History:  Procedure Laterality Date   ABDOMINAL EXPLORATION SURGERY  2005-2006   Dr Pat Patrick   CATARACT EXTRACTION [facility] Left 07/28/2019   Procedure: CATARACT EXTRACTION PHACO AND INTRAOCULAR LENS PLACEMENT (IOC) LEFT 7.83  00:53.4;  Surgeon: Birder Robson, MD;  Location: [facility];  Service: Ophthalmology;  Laterality: Left;   CATARACT EXTRACTION W/PHACO Right 08/25/2019   Procedure: CATARACT EXTRACTION PHACO AND INTRAOCULAR LENS PLACEMENT (IOC) RIGHT 6.04 00:41.0;  Surgeon: Birder Robson, MD;  Location: [place];  Service: Ophthalmology;  Laterality: Right;   CHOLECYSTECTOMY N/A 05/25/2016   Procedure: LAPAROSCOPIC CHOLECYSTECTOMY WITH INTRAOPERATIVE CHOLANGIOGRAM;  Surgeon: Robert Bellow, MD;  Location: [facility];  Service: General;  Laterality: N/A;   ELBOW FRACTURE SURGERY Right    EYE SURGERY     LAPAROTOMY N/A 05/30/2019   Procedure: EXPLORATORY LAPAROTOMYand small bowel resection;  Surgeon: Jules Husbands, MD;  Location: [facility];   Service: General;  Laterality: N/A;   XI ROBOTIC ASSISTED VENTRAL HERNIA N/A 11/06/2019   Procedure: XI ROBOTIC ASSISTED VENTRAL HERNIA REPAIR WITH MESH;  Surgeon: Jules Husbands, MD;  Location: [facility];  Service: General;  Laterality: N/A;   Patient Active Problem List   Diagnosis Date Noted   Bowel perforation ([place]) 05/30/2019   Calculus of gallbladder with acute cholecystitis without obstruction 05/15/2016    ONSET DATE: Over 6 months  REFERRING DIAG: R26.89 (ICD-10-CM) - Imbalance   THERAPY DIAG:  Difficulty in walking, not elsewhere classified  Muscle weakness (generalized)  Abnormality of gait and mobility  Other lack of coordination  Rationale for Evaluation and Treatment: Rehabilitation  SUBJECTIVE:  SUBJECTIVE STATEMENT: No updates sicne past visit. Palliative visit went fine. Spot on leg being monitored. HEP being performed.  Pt accompanied by: family member-[name]  PERTINENT HISTORY: Patient is a 89 year old female with Mixed Alzheimers and vascular dementia who lives alone yet daughter stays with her during the day. Reports does not drive and not happy about that yet has been independent with all ADLs- some imbalance however daughter denies any recent falls.   PAIN:  Are you having pain? No  PRECAUTIONS: Fall  WEIGHT BEARING RESTRICTIONS: No  FALLS: Has patient fallen in last 6 months? No    PATIENT GOALS: Caregiver goal- is to improve her stability and keep her safe in her home.2  OBJECTIVE:     Intervention this date:  -cable resisted walking 7.5lb 2x fwd, backward, left, and right, min guard and cues for safe eccentric performance with each  -load transfer, 10 objects from 0 -7lbs, single hand and 2 hand carry over red mat, blue mat and skinny airex beam; (1 fall to mat, controlled by gait belt, assisted up then continued actiivty)  *witnessed by DTR, pt reports no harm at time.   -farmers carry 5lb each hand 370f    Pt educated throughout session about proper posture and technique with exercises. Improved exercise technique, movement at target joints, use of target muscles after min to mod verbal, visual, tactile cues.        PATIENT EDUCATION: Education details: purpose of PT and how this service may assist in improving her balance Person educated: Patient, Child(ren), and [name] Education method: Explanation, Demonstration, Tactile cues, and Verbal cues Education comprehension: verbalized understanding, returned demonstration, verbal cues required, tactile cues required, and needs further education  HOME EXERCISE PROGRAM: Access Code: [email or web address]: [email or web address] Date: 09/11/2022 Prepared by: JSande Brothers Exercises - Seated Hip Flexion March with Ankle Weights  - 1 x daily - 3 x weekly - 3 sets - 10 reps - Seated Hip Abduction with Resistance  - 1 x daily - 3 x weekly - 3 sets - 10 reps - Seated Hip Adduction Isometrics with Ball  - 1 x daily - 3 x weekly - 3 sets - 10 reps - Seated Long Arc Quad with Ankle Weight  - 1 x daily - 3 x weekly - 3 sets - 10 reps - Seated Hamstring Curl with Anchored Resistance  - 1 x daily - 3 x weekly - 3 sets - 10 reps - Seated Heel Raise  - 1 x daily - 3 x weekly - 3 sets - 10 reps - Seated Heel Toe Raises  - 1 x daily - 3 x weekly - 3 sets - 10 reps - Sit to Stand with Arms Crossed  - 1 x daily - 3 x weekly - 3 sets - 10 reps  GOALS: Goals reviewed with patient? Yes  SHORT TERM GOALS: Target date: 10/18/2022  Pt and caregiver (daughter) will be independent with HEP in order to improve strength and balance in order to decrease fall risk and improve function at home and work Baseline: EVAL- Patient has no formal HEP and admits to not exercising.  Goal status: INITIAL   LONG TERM GOALS: Target date: 11/29/2022  Pt will improve FOTO to target score  of > 57 to display perceived  improvements in ability to complete ADL's.  Baseline: EVAL= 56  Goal status: INITIAL  2.  Pt will decrease 5TSTS by at least 3 seconds in order to demonstrate clinically significant improvement in LE strength. Baseline: EVAL=18.89 sec without UE support Goal status: INITIAL  3.  Pt will decrease  TUG to below 14 seconds/decrease in order to demonstrate decreased fall risk.  Baseline: EVAL= 17.23 sec without UE support Goal status: INITIAL  4.  Pt will improve BERG by at least 3 points in order to demonstrate clinically significant improvement in balance.  Baseline: EVAL=45/56 Goal status: INITIAL  5.  Patient will deny any falls during episode of care for optimal safety with all household and community distances  Baseline: EVAL= Unsteady yet no reported falls in last 6 months.  Goal status: INITIAL    ASSESSMENT:  CLINICAL IMPRESSION: Session focused on gait based balance training, simulating variable surfaces and IADL tasks. Good tolerance overall, 1 instance of catching her toe on red mat and controlled fall to mat, no harm sustained. DTR witnessed event.  Pt will continue to benefit from skilled PT services to address balance impairments to reduce falls risk.     OBJECTIVE IMPAIRMENTS: Abnormal gait, decreased balance, decreased cognition, decreased coordination, decreased mobility, difficulty walking, and decreased strength.   ACTIVITY LIMITATIONS: carrying, lifting, and stairs  PARTICIPATION LIMITATIONS: cleaning, laundry, shopping, community activity, and yard work  PERSONAL FACTORS: 1 comorbidity: dementia  are also affecting patient's functional outcome.   REHAB POTENTIAL: Good  CLINICAL DECISION MAKING: Evolving/moderate complexity  EVALUATION COMPLEXITY: Low  PLAN:  PT FREQUENCY: 1-2x/week  PT DURATION: 12 weeks  PLANNED INTERVENTIONS: Therapeutic exercises, Therapeutic activity, Neuromuscular re-education, Balance training, Gait training, Patient/Family  education, Self Care, Joint mobilization, Stair training, Vestibular training, Canalith repositioning, DME instructions, Cryotherapy, Moist heat, and Manual therapy  PLAN FOR NEXT SESSION: Continue with LE strengthening and balance training as appropriate.        11/01/2022, 9:38 AM  10:13 AM, 11/01/22 Etta Grandchild, PT, DPT Physical Therapist - [facility]  [phone] [facility])

## 2022-11-06 ENCOUNTER — Ambulatory Visit: Payer: Medicare Other | Admitting: Speech Pathology

## 2022-11-06 ENCOUNTER — Ambulatory Visit: Payer: Medicare Other

## 2022-11-06 DIAGNOSIS — R262 Difficulty in walking, not elsewhere classified: Secondary | ICD-10-CM | POA: Diagnosis not present

## 2022-11-06 DIAGNOSIS — R41841 Cognitive communication deficit: Secondary | ICD-10-CM

## 2022-11-06 DIAGNOSIS — M6281 Muscle weakness (generalized): Secondary | ICD-10-CM

## 2022-11-06 DIAGNOSIS — R269 Unspecified abnormalities of gait and mobility: Secondary | ICD-10-CM

## 2022-11-06 DIAGNOSIS — R278 Other lack of coordination: Secondary | ICD-10-CM

## 2022-11-06 NOTE — Therapy (Signed)
OUTPATIENT PHYSICAL THERAPY NEURO TREATMENT   Patient Name: Jeanette Newton MRN: [id number] DOB:[birth year], 89 y.o., female Today's Date: 11/06/2022   PCP: Dr. Emily Filbert REFERRING PROVIDER: Dr. Jennings Books  END OF SESSION:  PT End of Session - 11/06/22 0934     Visit Number 18    Number of Visits 24    Date for PT Re-Evaluation 11/29/22    Authorization Type [place] Time Period 09/06/22-11/29/22    Progress Note Due on Visit 20    PT Start Time 0933    PT Stop Time 1015    PT Time Calculation (min) 42 min    Equipment Utilized During Treatment Gait belt    Activity Tolerance Patient tolerated treatment well;No increased pain                Past Medical History:  Diagnosis Date   Cancer ([place]) 2005-2006   lymphoma   Hypothyroidism    Status post chemoradiation    lymphoma   Status post radiation therapy    Thyroid disease    Wears hearing aid in both ears    Past Surgical History:  Procedure Laterality Date   ABDOMINAL EXPLORATION SURGERY  2005-2006   Dr Pat Patrick   CATARACT EXTRACTION [facility] Left 07/28/2019   Procedure: CATARACT EXTRACTION PHACO AND INTRAOCULAR LENS PLACEMENT (IOC) LEFT 7.83  00:53.4;  Surgeon: Birder Robson, MD;  Location: [place];  Service: Ophthalmology;  Laterality: Left;   CATARACT EXTRACTION W/PHACO Right 08/25/2019   Procedure: CATARACT EXTRACTION PHACO AND INTRAOCULAR LENS PLACEMENT (IOC) RIGHT 6.04 00:41.0;  Surgeon: Birder Robson, MD;  Location: [facility];  Service: Ophthalmology;  Laterality: Right;   CHOLECYSTECTOMY N/A 05/25/2016   Procedure: LAPAROSCOPIC CHOLECYSTECTOMY WITH INTRAOPERATIVE CHOLANGIOGRAM;  Surgeon: Robert Bellow, MD;  Location: [facility];  Service: General;  Laterality: N/A;   ELBOW FRACTURE SURGERY Right    EYE SURGERY     LAPAROTOMY N/A 05/30/2019   Procedure: EXPLORATORY LAPAROTOMYand small bowel resection;  Surgeon: Jules Husbands, MD;  Location: [facility];   Service: General;  Laterality: N/A;   XI ROBOTIC ASSISTED VENTRAL HERNIA N/A 11/06/2019   Procedure: XI ROBOTIC ASSISTED VENTRAL HERNIA REPAIR WITH MESH;  Surgeon: Jules Husbands, MD;  Location: [facility];  Service: General;  Laterality: N/A;   Patient Active Problem List   Diagnosis Date Noted   Bowel perforation ([place]) 05/30/2019   Calculus of gallbladder with acute cholecystitis without obstruction 05/15/2016    ONSET DATE: Over 6 months  REFERRING DIAG: R26.89 (ICD-10-CM) - Imbalance   THERAPY DIAG:  Difficulty in walking, not elsewhere classified  Muscle weakness (generalized)  Abnormality of gait and mobility  Other lack of coordination  Cognitive communication deficit  Rationale for Evaluation and Treatment: Rehabilitation  SUBJECTIVE:  SUBJECTIVE STATEMENT: Pt and daughter believe her knot on her leg is improving. Less redness and swelling. Less tender. Denies falls at home.  Pt accompanied by: family member-[name]  PERTINENT HISTORY: Patient is a 89 year old female with Mixed Alzheimers and vascular dementia who lives alone yet daughter stays with her during the day. Reports does not drive and not happy about that yet has been independent with all ADLs- some imbalance however daughter denies any recent falls.   PAIN:  Are you having pain? No  PRECAUTIONS: Fall  WEIGHT BEARING RESTRICTIONS: No  FALLS: Has patient fallen in last 6 months? No    PATIENT GOALS: Caregiver goal- is to improve her stability and keep her safe in her home.2  OBJECTIVE:   Neuro Re-Ed: Cable resisted walking 7.5lb 3x fwd, backward, left, and right, min guard and cues for safe eccentric performance with each. Most difficulty with L side stepping and posterior gait. CGA.  Obstacle course: ambulating over the red mat --> alternating step overs 1/2 bolster to hurdle (2 of each)--> 4 hedgehog balls in diamond formation. 2  forwards/2 lateral (R x1 and L x1).    At hedgehog balls given cognitive tasks ranging from tapping foot on 1 color. Progressed to R/L foot to certain color called out then further progressed to 3 step commands with foot, color and direction (fwd/bckwd). Difficulty with cognitive task overall but primarily with understanding R vs L. Good understanding of colors and direction.   Ambulating in hallway: x4 laps tossing ball to self with motor dual task. Frequent variable step lengths to recover, forward stepping to catch ball for reaction time and ocrrecting anterior LOB with stepping strategy.     Pt educated throughout session about proper posture and technique with exercises. Improved exercise technique, movement at target joints, use of target muscles after min to mod verbal, visual, tactile cues.    PATIENT EDUCATION: Education details: purpose of PT and how this service may assist in improving her balance Person educated: Patient, Child(ren), and [name] Education method: Explanation, Demonstration, Tactile cues, and Verbal cues Education comprehension: verbalized understanding, returned demonstration, verbal cues required, tactile cues required, and needs further education  HOME EXERCISE PROGRAM: Access Code: [email or web address] URL: [email or web address] Date: 09/11/2022 Prepared by: Sande Brothers  Exercises - Seated Hip Flexion March with Ankle Weights  - 1 x daily - 3 x weekly - 3 sets - 10 reps - Seated Hip Abduction with Resistance  - 1 x daily - 3 x weekly - 3 sets - 10 reps - Seated Hip Adduction Isometrics with Ball  - 1 x daily - 3 x weekly - 3 sets - 10 reps - Seated Long Arc Quad with Ankle Weight  - 1 x daily - 3 x weekly - 3 sets - 10 reps - Seated Hamstring Curl with Anchored Resistance  - 1 x daily - 3 x weekly - 3 sets - 10 reps - Seated Heel Raise  - 1 x daily - 3 x weekly - 3 sets - 10 reps - Seated Heel Toe Raises  - 1 x daily - 3 x weekly - 3  sets - 10 reps - Sit to Stand with Arms Crossed  - 1 x daily - 3 x weekly - 3 sets - 10 reps  GOALS: Goals reviewed with patient? Yes  SHORT TERM GOALS: Target date: 10/18/2022  Pt and caregiver (daughter) will be independent with HEP in order to improve strength and balance in order to decrease fall risk and improve function at home and work Baseline: EVAL- Patient has no formal HEP and admits to not exercising.  Goal status:  INITIAL   LONG TERM GOALS: Target date: 11/29/2022  Pt will improve FOTO to target score  of > 57 to display perceived improvements in ability to complete ADL's.  Baseline: EVAL= 56  Goal status: INITIAL  2.  Pt will decrease 5TSTS by at least 3 seconds in order to demonstrate clinically significant improvement in LE strength. Baseline: EVAL=18.89 sec without UE support Goal status: INITIAL  3.  Pt will decrease TUG to below 14 seconds/decrease in order to demonstrate decreased fall risk.  Baseline: EVAL= 17.23 sec without UE support Goal status: INITIAL  4.  Pt will improve BERG by at least 3 points in order to demonstrate clinically significant improvement in balance.  Baseline: EVAL=45/56 Goal status: INITIAL  5.  Patient will deny any falls during episode of care for optimal safety with all household and community distances  Baseline: EVAL= Unsteady yet no reported falls in last 6 months.  Goal status: INITIAL    ASSESSMENT:  CLINICAL IMPRESSION: Continuing PT POC with focus on gait training with variable surfaces, foot clearance, and use of motor/cognitive dual tasks. Pt with increased difficulty with balance when provided cognitive and motor dual tasking with slowed gait and task completion leading to increased LOB needing stepping strategy to correct LOB but does not require physical assist to correct. Pt follows simple commands well but does rely on the use of multimodal cuing and PT demo in setting of baseline cognitive deficits for correct  follow through of balance/gait training exercises. Pt will continue to benefit from skilled PT services to address balance impairments to reduce falls risk.    OBJECTIVE IMPAIRMENTS: Abnormal gait, decreased balance, decreased cognition, decreased coordination, decreased mobility, difficulty walking, and decreased strength.   ACTIVITY LIMITATIONS: carrying, lifting, and stairs  PARTICIPATION LIMITATIONS: cleaning, laundry, shopping, community activity, and yard work  PERSONAL FACTORS: 1 comorbidity: dementia  are also affecting patient's functional outcome.   REHAB POTENTIAL: Good  CLINICAL DECISION MAKING: Evolving/moderate complexity  EVALUATION COMPLEXITY: Low  PLAN:  PT FREQUENCY: 1-2x/week  PT DURATION: 12 weeks  PLANNED INTERVENTIONS: Therapeutic exercises, Therapeutic activity, Neuromuscular re-education, Balance training, Gait training, Patient/Family education, Self Care, Joint mobilization, Stair training, Vestibular training, Canalith repositioning, DME instructions, Cryotherapy, Moist heat, and Manual therapy  PLAN FOR NEXT SESSION: Continue with LE strengthening and balance training as appropriate.      Salem Caster. Fairly IV, PT, DPT Physical Therapist- [facility]  11/06/2022, 10:35 AM

## 2022-11-07 NOTE — Therapy (Signed)
OUTPATIENT PHYSICAL THERAPY NEURO TREATMENT   Patient Name: Jeanette Newton MRN: [id number] DOB:[birth year], 89 y.o., female Today's Date: 11/08/2022   PCP: Dr. Emily Filbert REFERRING PROVIDER: Dr. Jennings Books  END OF SESSION:  PT End of Session - 11/08/22 0939     Visit Number 19    Number of Visits 24    Date for PT Re-Evaluation 11/29/22    Authorization Type [place] Time Period 09/06/22-11/29/22    Progress Note Due on Visit 20    PT Start Time 0930    PT Stop Time 1008    PT Time Calculation (min) 38 min    Equipment Utilized During Treatment Gait belt    Activity Tolerance Patient tolerated treatment well;No increased pain                 Past Medical History:  Diagnosis Date   Cancer ([place]) 2005-2006   lymphoma   Hypothyroidism    Status post chemoradiation    lymphoma   Status post radiation therapy    Thyroid disease    Wears hearing aid in both ears    Past Surgical History:  Procedure Laterality Date   ABDOMINAL EXPLORATION SURGERY  2005-2006   Dr Pat Patrick   CATARACT EXTRACTION [facility] Left 07/28/2019   Procedure: CATARACT EXTRACTION PHACO AND INTRAOCULAR LENS PLACEMENT (IOC) LEFT 7.83  00:53.4;  Surgeon: Birder Robson, MD;  Location: [place];  Service: Ophthalmology;  Laterality: Left;   CATARACT EXTRACTION W/PHACO Right 08/25/2019   Procedure: CATARACT EXTRACTION PHACO AND INTRAOCULAR LENS PLACEMENT (IOC) RIGHT 6.04 00:41.0;  Surgeon: Birder Robson, MD;  Location: [place];  Service: Ophthalmology;  Laterality: Right;   CHOLECYSTECTOMY N/A 05/25/2016   Procedure: LAPAROSCOPIC CHOLECYSTECTOMY WITH INTRAOPERATIVE CHOLANGIOGRAM;  Surgeon: Robert Bellow, MD;  Location: [facility];  Service: General;  Laterality: N/A;   ELBOW FRACTURE SURGERY Right    EYE SURGERY     LAPAROTOMY N/A 05/30/2019   Procedure: EXPLORATORY LAPAROTOMYand small bowel resection;  Surgeon: Jules Husbands, MD;  Location: [facility];   Service: General;  Laterality: N/A;   XI ROBOTIC ASSISTED VENTRAL HERNIA N/A 11/06/2019   Procedure: XI ROBOTIC ASSISTED VENTRAL HERNIA REPAIR WITH MESH;  Surgeon: Jules Husbands, MD;  Location: [facility];  Service: General;  Laterality: N/A;   Patient Active Problem List   Diagnosis Date Noted   Bowel perforation ([place]) 05/30/2019   Calculus of gallbladder with acute cholecystitis without obstruction 05/15/2016    ONSET DATE: Over 6 months  REFERRING DIAG: R26.89 (ICD-10-CM) - Imbalance   THERAPY DIAG:  Difficulty in walking, not elsewhere classified  Muscle weakness (generalized)  Abnormality of gait and mobility  Other lack of coordination  Cognitive communication deficit  Rationale for Evaluation and Treatment: Rehabilitation  SUBJECTIVE:  SUBJECTIVE STATEMENT: Patient reports doing well without any new concerns.  Pt accompanied by: family member-[name]  PERTINENT HISTORY: Patient is a 89 year old female with Mixed Alzheimers and vascular dementia who lives alone yet daughter stays with her during the day. Reports does not drive and not happy about that yet has been independent with all ADLs- some imbalance however daughter denies any recent falls.   PAIN:  Are you having pain? No  PRECAUTIONS: Fall  WEIGHT BEARING RESTRICTIONS: No  FALLS: Has patient fallen in last 6 months? No    PATIENT GOALS: Caregiver goal- is to improve her stability and keep her safe in her home.2  OBJECTIVE:   THEREX:   Seated knee flex (matrix) 2 sets of 10 reps with 7.5 #  Seated knee ext (matrix) 1 set of 10 reps 7.5#   Sit to stand - holding onto 2kg ball x 12 reps.   Static squat hold- hold 10 sec x 2 sets  Seated hip march 2 sets of 10 rep without any LOB     Neuro Re-Ed:  Dual task- cognitive and physical- Patient standing in middle of Y tap line- 3 colored cones position on each line- patient  instructed to place a ball on top of whichever colored cone that answered the question- Ex.What color is a pumpkin and patient would place ball on top of orange colored cone.  Difficulty with cognitive task overall with delayed response yet mostly answered questions appropriately and able to squat and maintain balance.   Single leg stance- Multiple attempts  each leg- at best 11 sec on right and 7 sec on left     Pt educated throughout session about proper posture and technique with exercises. Improved exercise technique, movement at target joints, use of target muscles after min to mod verbal, visual, tactile cues.    PATIENT EDUCATION: Education details: purpose of PT and how this service may assist in improving her balance Person educated: Patient, Child(ren), and [name] Education method: Explanation, Demonstration, Tactile cues, and Verbal cues Education comprehension: verbalized understanding, returned demonstration, verbal cues required, tactile cues required, and needs further education  HOME EXERCISE PROGRAM: Access Code: [email or web address] URL: [email or web address] Date: 09/11/2022 Prepared by: Sande Brothers  Exercises - Seated Hip Flexion March with Ankle Weights  - 1 x daily - 3 x weekly - 3 sets - 10 reps - Seated Hip Abduction with Resistance  - 1 x daily - 3 x weekly - 3 sets - 10 reps - Seated Hip Adduction Isometrics with Ball  - 1 x daily - 3 x weekly - 3 sets - 10 reps - Seated Long Arc Quad with Ankle Weight  - 1 x daily - 3 x weekly - 3 sets - 10 reps - Seated Hamstring Curl with Anchored Resistance  - 1 x daily - 3 x weekly - 3 sets - 10 reps - Seated Heel Raise  - 1 x daily - 3 x weekly - 3 sets - 10 reps - Seated Heel Toe Raises  - 1 x daily - 3 x weekly - 3 sets - 10 reps - Sit to Stand with Arms Crossed  - 1 x daily - 3 x weekly - 3 sets - 10 reps  GOALS: Goals reviewed with patient? Yes  SHORT TERM GOALS: Target date: 10/18/2022  Pt  and caregiver (daughter) will be independent with HEP in order to improve strength and balance in order to decrease fall risk and improve function at home and work Baseline: EVAL- Patient has no formal HEP and admits  to not exercising.  Goal status: INITIAL   LONG TERM GOALS: Target date: 11/29/2022  Pt will improve FOTO to target score  of > 57 to display perceived improvements in ability to complete ADL's.  Baseline: EVAL= 56  Goal status: INITIAL  2.  Pt will decrease 5TSTS by at least 3 seconds in order to demonstrate clinically significant improvement in LE strength. Baseline: EVAL=18.89 sec without UE support Goal status: INITIAL  3.  Pt will decrease TUG to below 14 seconds/decrease in order to demonstrate decreased fall risk.  Baseline: EVAL= 17.23 sec without UE support Goal status: INITIAL  4.  Pt will improve BERG by at least 3 points in order to demonstrate clinically significant improvement in balance.  Baseline: EVAL=45/56 Goal status: INITIAL  5.  Patient will deny any falls during episode of care for optimal safety with all household and community distances  Baseline: EVAL= Unsteady yet no reported falls in last 6 months.  Goal status: INITIAL    ASSESSMENT:  CLINICAL IMPRESSION: Patient continues to demo some difficulty with balance when provided cognitive and motor dual tasking with slowed response time yet no LOB with task completion. She was fatigued overall with more resistance yet able to complete with fatigue as limiting factor.  Pt will continue to benefit from skilled PT services to address balance impairments to reduce falls risk.    OBJECTIVE IMPAIRMENTS: Abnormal gait, decreased balance, decreased cognition, decreased coordination, decreased mobility, difficulty walking, and decreased strength.   ACTIVITY LIMITATIONS: carrying, lifting, and stairs  PARTICIPATION LIMITATIONS: cleaning, laundry, shopping, community activity, and yard work  PERSONAL  FACTORS: 1 comorbidity: dementia  are also affecting patient's functional outcome.   REHAB POTENTIAL: Good  CLINICAL DECISION MAKING: Evolving/moderate complexity  EVALUATION COMPLEXITY: Low  PLAN:  PT FREQUENCY: 1-2x/week  PT DURATION: 12 weeks  PLANNED INTERVENTIONS: Therapeutic exercises, Therapeutic activity, Neuromuscular re-education, Balance training, Gait training, Patient/Family education, Self Care, Joint mobilization, Stair training, Vestibular training, Canalith repositioning, DME instructions, Cryotherapy, Moist heat, and Manual therapy  PLAN FOR NEXT SESSION: Continue with LE strengthening and balance training as appropriate.      Ollen Bowl, PT Physical Therapist- [facility]  11/08/2022, 10:10 AM

## 2022-11-08 ENCOUNTER — Encounter: Payer: Medicare Other | Admitting: Speech Pathology

## 2022-11-08 ENCOUNTER — Ambulatory Visit: Payer: Medicare Other

## 2022-11-08 DIAGNOSIS — R41841 Cognitive communication deficit: Secondary | ICD-10-CM

## 2022-11-08 DIAGNOSIS — R262 Difficulty in walking, not elsewhere classified: Secondary | ICD-10-CM | POA: Diagnosis not present

## 2022-11-08 DIAGNOSIS — R269 Unspecified abnormalities of gait and mobility: Secondary | ICD-10-CM

## 2022-11-08 DIAGNOSIS — R278 Other lack of coordination: Secondary | ICD-10-CM

## 2022-11-08 DIAGNOSIS — M6281 Muscle weakness (generalized): Secondary | ICD-10-CM

## 2022-11-12 NOTE — Therapy (Signed)
OUTPATIENT PHYSICAL THERAPY NEURO TREATMENT/Physical Therapy Progress Note   Dates of reporting period  2/13   to   11/13/2022    Patient Name: Jeanette Newton MRN: [id number] DOB:09-05-1927, 89 y.o., female Today's Date: 11/13/2022   PCP: Dr. Emily Filbert REFERRING PROVIDER: Dr. Jennings Books  END OF SESSION:  PT End of Session - 11/13/22 0936     Visit Number 20    Number of Visits 24    Date for PT Re-Evaluation 11/29/22    Authorization Type [place] Time Period 09/06/22-11/29/22    Progress Note Due on Visit 20    PT Start Time 0930    Equipment Utilized During Treatment Gait belt    Activity Tolerance Patient tolerated treatment well;No increased pain                  Past Medical History:  Diagnosis Date   Cancer ([place]) 2005-2006   lymphoma   Hypothyroidism    Status post chemoradiation    lymphoma   Status post radiation therapy    Thyroid disease    Wears hearing aid in both ears    Past Surgical History:  Procedure Laterality Date   ABDOMINAL EXPLORATION SURGERY  2005-2006   Dr Pat Patrick   CATARACT EXTRACTION [facility] Left 07/28/2019   Procedure: CATARACT EXTRACTION PHACO AND INTRAOCULAR LENS PLACEMENT (IOC) LEFT 7.83  00:53.4;  Surgeon: Birder Robson, MD;  Location: [place];  Service: Ophthalmology;  Laterality: Left;   CATARACT EXTRACTION W/PHACO Right 08/25/2019   Procedure: CATARACT EXTRACTION PHACO AND INTRAOCULAR LENS PLACEMENT (IOC) RIGHT 6.04 00:41.0;  Surgeon: Birder Robson, MD;  Location: [place];  Service: Ophthalmology;  Laterality: Right;   CHOLECYSTECTOMY N/A 05/25/2016   Procedure: LAPAROSCOPIC CHOLECYSTECTOMY WITH INTRAOPERATIVE CHOLANGIOGRAM;  Surgeon: Robert Bellow, MD;  Location: [facility];  Service: General;  Laterality: N/A;   ELBOW FRACTURE SURGERY Right    EYE SURGERY     LAPAROTOMY N/A 05/30/2019   Procedure: EXPLORATORY LAPAROTOMYand small bowel resection;  Surgeon: Jules Husbands, MD;  Location: [facility];  Service: General;  Laterality: N/A;   XI ROBOTIC ASSISTED VENTRAL HERNIA N/A 11/06/2019   Procedure: XI ROBOTIC ASSISTED VENTRAL HERNIA REPAIR WITH MESH;  Surgeon: Jules Husbands, MD;  Location: [facility];  Service: General;  Laterality: N/A;   Patient Active Problem List   Diagnosis Date Noted   Bowel perforation ([place]) 05/30/2019   Calculus of gallbladder with acute cholecystitis without obstruction 05/15/2016    ONSET DATE: Over 6 months  REFERRING DIAG: R26.89 (ICD-10-CM) - Imbalance   THERAPY DIAG:  Difficulty in walking, not elsewhere classified  Muscle weakness (generalized)  Abnormality of gait and mobility  Other lack of coordination  Rationale for Evaluation and Treatment: Rehabilitation  SUBJECTIVE:  SUBJECTIVE STATEMENT: Patient reports doing well without any new concerns.  Pt accompanied by: family member-[name]  PERTINENT HISTORY: Patient is a 89 year old female with Mixed Alzheimers and vascular dementia who lives alone yet daughter stays with her during the day. Reports does not drive and not happy about that yet has been independent with all ADLs- some imbalance however daughter denies any recent falls.   PAIN:  Are you having pain? No  PRECAUTIONS: Fall  WEIGHT BEARING RESTRICTIONS: No  FALLS: Has patient fallen in last 6 months? No    PATIENT GOALS: Caregiver goal- is to improve her stability and keep her safe in her home.2  OBJECTIVE:   Reassessed goals for progress note: - 6 min walk test= 1200 feet  - Added FGA for higher level balance= 18/30  THEREX:   Sit to stand - green pad under right LE x 12 reps without UE support.        Neuro Re-Ed:   Diona Foley toss against wall walking down hallway turning Left to right x 50 feet x 2       Pt educated throughout session about proper posture and technique with exercises. Improved exercise  technique, movement at target joints, use of target muscles after min to mod verbal, visual, tactile cues.    PATIENT EDUCATION: Education details: purpose of PT and how this service may assist in improving her balance Person educated: Patient, Child(ren), and [name] Education method: Explanation, Demonstration, Tactile cues, and Verbal cues Education comprehension: verbalized understanding, returned demonstration, verbal cues required, tactile cues required, and needs further education  HOME EXERCISE PROGRAM: Access Code: [email or web address] URL: [email or web address] Date: 09/11/2022 Prepared by: Sande Brothers  Exercises - Seated Hip Flexion March with Ankle Weights  - 1 x daily - 3 x weekly - 3 sets - 10 reps - Seated Hip Abduction with Resistance  - 1 x daily - 3 x weekly - 3 sets - 10 reps - Seated Hip Adduction Isometrics with Ball  - 1 x daily - 3 x weekly - 3 sets - 10 reps - Seated Long Arc Quad with Ankle Weight  - 1 x daily - 3 x weekly - 3 sets - 10 reps - Seated Hamstring Curl with Anchored Resistance  - 1 x daily - 3 x weekly - 3 sets - 10 reps - Seated Heel Raise  - 1 x daily - 3 x weekly - 3 sets - 10 reps - Seated Heel Toe Raises  - 1 x daily - 3 x weekly - 3 sets - 10 reps - Sit to Stand with Arms Crossed  - 1 x daily - 3 x weekly - 3 sets - 10 reps  GOALS: Goals reviewed with patient? Yes  SHORT TERM GOALS: Target date: 10/18/2022  Pt and caregiver (daughter) will be independent with HEP in order to improve strength and balance in order to decrease fall risk and improve function at home and work Baseline: EVAL- Patient has no formal HEP and admits to not exercising.  Goal status: INITIAL   LONG TERM GOALS: Target date: 11/29/2022  Pt will improve FOTO to target score  of > 57 to display perceived improvements in ability to complete ADL's.  Baseline: EVAL= 56  Goal status: INITIAL  2.  Pt will decrease 5TSTS by at least 3 seconds in order to  demonstrate clinically significant improvement in LE strength. Baseline: EVAL=18.89 sec without UE support; 10/09/2022= 13.70 sec without UE  Goal status: MET  3.  Pt will decrease TUG to below 14 seconds/decrease in order  to demonstrate decreased fall risk. Baseline: EVAL= 17.23 sec without UE support; 10/09/2022= 13.36 sec  Goal status: MET  4.  Pt will improve BERG by at least 3 points in order to demonstrate clinically significant improvement in balance.  Baseline: EVAL=45/56; 10/09/2022=51/56  Goal status: MET  5.  Patient will deny any falls during episode of care for optimal safety with all household and community distances  Baseline: EVAL= Unsteady yet no reported falls in last 6 months.  10/09/2022= No falls observed or reported per daughter. 11/13/2022= 1 fall since 10/09/2022 Goal status: ONGOING  6.  Pt will increase 6MWT by at least 72m (117ft) in order to demonstrate clinically significant improvement in cardiopulmonary endurance and community ambulation             Baseline: 10/09/2022= 820; 11/13/2022= 1200 feet             Goal status: MET  7. Pt will improve FGA by at least 3 points in order to demonstrate clinically significant improvement in balance and decreased risk for falls. Baseline: 18/30 Goal status: Initial   ASSESSMENT:  CLINICAL IMPRESSION: Patient presents with continued improvement with functional mobility- much improved 6 min walk test. Updated balance goal to work on higher level balance activities. She was challenged with dynamic head turning and stepping over obstacles. Patient's condition has the potential to improve in response to therapy. Maximum improvement is yet to be obtained. The anticipated improvement is attainable and reasonable in a generally predictable time.   Pt will continue to benefit from skilled PT services to address balance impairments to reduce falls risk.    OBJECTIVE IMPAIRMENTS: Abnormal gait, decreased balance, decreased cognition,  decreased coordination, decreased mobility, difficulty walking, and decreased strength.   ACTIVITY LIMITATIONS: carrying, lifting, and stairs  PARTICIPATION LIMITATIONS: cleaning, laundry, shopping, community activity, and yard work  PERSONAL FACTORS: 1 comorbidity: dementia  are also affecting patient's functional outcome.   REHAB POTENTIAL: Good  CLINICAL DECISION MAKING: Evolving/moderate complexity  EVALUATION COMPLEXITY: Low  PLAN:  PT FREQUENCY: 1-2x/week  PT DURATION: 12 weeks  PLANNED INTERVENTIONS: Therapeutic exercises, Therapeutic activity, Neuromuscular re-education, Balance training, Gait training, Patient/Family education, Self Care, Joint mobilization, Stair training, Vestibular training, Canalith repositioning, DME instructions, Cryotherapy, Moist heat, and Manual therapy  PLAN FOR NEXT SESSION: Continue with LE strengthening and balance training as appropriate.  Scavenger hunt next visit for continued higher level balance    Ollen Bowl, PT Physical Therapist- [facility]  11/13/2022, 9:38 AM

## 2022-11-13 ENCOUNTER — Ambulatory Visit: Payer: Medicare Other | Admitting: Speech Pathology

## 2022-11-13 ENCOUNTER — Ambulatory Visit: Payer: Medicare Other

## 2022-11-13 DIAGNOSIS — M6281 Muscle weakness (generalized): Secondary | ICD-10-CM

## 2022-11-13 DIAGNOSIS — R278 Other lack of coordination: Secondary | ICD-10-CM

## 2022-11-13 DIAGNOSIS — R262 Difficulty in walking, not elsewhere classified: Secondary | ICD-10-CM

## 2022-11-13 DIAGNOSIS — R269 Unspecified abnormalities of gait and mobility: Secondary | ICD-10-CM

## 2022-11-15 ENCOUNTER — Encounter: Payer: Medicare Other | Admitting: Speech Pathology

## 2022-11-15 ENCOUNTER — Ambulatory Visit: Payer: Medicare Other

## 2022-11-15 DIAGNOSIS — M6281 Muscle weakness (generalized): Secondary | ICD-10-CM

## 2022-11-15 DIAGNOSIS — R269 Unspecified abnormalities of gait and mobility: Secondary | ICD-10-CM

## 2022-11-15 DIAGNOSIS — R278 Other lack of coordination: Secondary | ICD-10-CM

## 2022-11-15 DIAGNOSIS — R262 Difficulty in walking, not elsewhere classified: Secondary | ICD-10-CM

## 2022-11-15 NOTE — Therapy (Signed)
OUTPATIENT PHYSICAL THERAPY NEURO TREATMENT  Patient Name: Jeanette Newton MRN: [id number] DOB:[birth year], 89 y.o., female Today's Date: 11/15/2022   PCP: Dr. Emily Filbert REFERRING PROVIDER: Dr. Jennings Books  END OF SESSION:  PT End of Session - 11/15/22 0933     Visit Number 21    Number of Visits 24    Date for PT Re-Evaluation 11/29/22    Authorization Type [place] Time Period 09/06/22-11/29/22    Progress Note Due on Visit 30    PT Start Time 0930    PT Stop Time 1013    PT Time Calculation (min) 43 min    Equipment Utilized During Treatment Gait belt    Activity Tolerance Patient tolerated treatment well;No increased pain                   Past Medical History:  Diagnosis Date   Cancer ([place]) 2005-2006   lymphoma   Hypothyroidism    Status post chemoradiation    lymphoma   Status post radiation therapy    Thyroid disease    Wears hearing aid in both ears    Past Surgical History:  Procedure Laterality Date   ABDOMINAL EXPLORATION SURGERY  2005-2006   Dr Pat Patrick   CATARACT EXTRACTION [facility] Left 07/28/2019   Procedure: CATARACT EXTRACTION PHACO AND INTRAOCULAR LENS PLACEMENT (IOC) LEFT 7.83  00:53.4;  Surgeon: Birder Robson, MD;  Location: [place];  Service: Ophthalmology;  Laterality: Left;   CATARACT EXTRACTION W/PHACO Right 08/25/2019   Procedure: CATARACT EXTRACTION PHACO AND INTRAOCULAR LENS PLACEMENT (IOC) RIGHT 6.04 00:41.0;  Surgeon: Birder Robson, MD;  Location: [place];  Service: Ophthalmology;  Laterality: Right;   CHOLECYSTECTOMY N/A 05/25/2016   Procedure: LAPAROSCOPIC CHOLECYSTECTOMY WITH INTRAOPERATIVE CHOLANGIOGRAM;  Surgeon: Robert Bellow, MD;  Location: [facility];  Service: General;  Laterality: N/A;   ELBOW FRACTURE SURGERY Right    EYE SURGERY     LAPAROTOMY N/A 05/30/2019   Procedure: EXPLORATORY LAPAROTOMYand small bowel resection;  Surgeon: Jules Husbands, MD;  Location: [facility]  [facility];  Service: General;  Laterality: N/A;   XI ROBOTIC ASSISTED VENTRAL HERNIA N/A 11/06/2019   Procedure: XI ROBOTIC ASSISTED VENTRAL HERNIA REPAIR WITH MESH;  Surgeon: Jules Husbands, MD;  Location: [facility];  Service: General;  Laterality: N/A;   Patient Active Problem List   Diagnosis Date Noted   Bowel perforation ([place]) 05/30/2019   Calculus of gallbladder with acute cholecystitis without obstruction 05/15/2016    ONSET DATE: Over 6 months  REFERRING DIAG: R26.89 (ICD-10-CM) - Imbalance   THERAPY DIAG:  Difficulty in walking, not elsewhere classified  Muscle weakness (generalized)  Abnormality of gait and mobility  Other lack of coordination  Rationale for Evaluation and Treatment: Rehabilitation  SUBJECTIVE:  SUBJECTIVE STATEMENT: Aren't you tired of seeing me? Patient denies any new concerns  Pt accompanied by: family member-[name]  PERTINENT HISTORY: Patient is a 89 year old female with Mixed Alzheimers and vascular dementia who lives alone yet daughter stays with her during the day. Reports does not drive and not happy about that yet has been independent with all ADLs- some imbalance however daughter denies any recent falls.   PAIN:  Are you having pain? No  PRECAUTIONS: Fall  WEIGHT BEARING RESTRICTIONS: No  FALLS: Has patient fallen in last 6 months? No    PATIENT GOALS: Caregiver goal- is to improve her stability and keep her safe in her home.2  OBJECTIVE:    Neuro Re-Ed:   Scavenger hunt for cones placed throughout gym- high and low. Patient was able to dynamic turn and raise her head without LOB and no unsteadiness- It did take her some time to find the cones but more due to cognitive deficits/attention vs. Any physical issues.    Dual task activities: placing dry magnet letters in position by color and later placing in alphabetical order - focusing on changing surface from firm, to  purple pad, airex beam, bosu ball- (all with progressive difficulty and unsteadiness) unable to maintain on BOSU without some form of UE Support. Patient was also limited today by fatigue. Required CGA and use of gait belt for safety.         Pt educated throughout session about proper posture and technique with exercises. Improved exercise technique, movement at target joints, use of target muscles after min to mod verbal, visual, tactile cues.    PATIENT EDUCATION: Education details: purpose of PT and how this service may assist in improving her balance Person educated: Patient, Child(ren), and [name] Education method: Explanation, Demonstration, Tactile cues, and Verbal cues Education comprehension: verbalized understanding, returned demonstration, verbal cues required, tactile cues required, and needs further education  HOME EXERCISE PROGRAM: Access Code: [email or web address] URL: [email or web address] Date: 09/11/2022 Prepared by: Sande Brothers  Exercises - Seated Hip Flexion March with Ankle Weights  - 1 x daily - 3 x weekly - 3 sets - 10 reps - Seated Hip Abduction with Resistance  - 1 x daily - 3 x weekly - 3 sets - 10 reps - Seated Hip Adduction Isometrics with Ball  - 1 x daily - 3 x weekly - 3 sets - 10 reps - Seated Long Arc Quad with Ankle Weight  - 1 x daily - 3 x weekly - 3 sets - 10 reps - Seated Hamstring Curl with Anchored Resistance  - 1 x daily - 3 x weekly - 3 sets - 10 reps - Seated Heel Raise  - 1 x daily - 3 x weekly - 3 sets - 10 reps - Seated Heel Toe Raises  - 1 x daily - 3 x weekly - 3 sets - 10 reps - Sit to Stand with Arms Crossed  - 1 x daily - 3 x weekly - 3 sets - 10 reps  GOALS: Goals reviewed with patient? Yes  SHORT TERM GOALS: Target date: 10/18/2022  Pt and caregiver (daughter) will be independent with HEP in order to improve strength and balance in order to decrease fall risk and improve function at home and work Baseline:  EVAL- Patient has no formal HEP and admits to not exercising.  Goal status: INITIAL   LONG TERM GOALS: Target date: 11/29/2022  Pt will improve FOTO to target score  of > 57 to display perceived improvements in ability to complete ADL's.  Baseline: EVAL= 56  Goal  status: INITIAL  2.  Pt will decrease 5TSTS by at least 3 seconds in order to demonstrate clinically significant improvement in LE strength. Baseline: EVAL=18.89 sec without UE support; 10/09/2022= 13.70 sec without UE  Goal status: MET  3.  Pt will decrease TUG to below 14 seconds/decrease in order to demonstrate decreased fall risk. Baseline: EVAL= 17.23 sec without UE support; 10/09/2022= 13.36 sec  Goal status: MET  4.  Pt will improve BERG by at least 3 points in order to demonstrate clinically significant improvement in balance.  Baseline: EVAL=45/56; 10/09/2022=51/56  Goal status: MET  5.  Patient will deny any falls during episode of care for optimal safety with all household and community distances  Baseline: EVAL= Unsteady yet no reported falls in last 6 months.  10/09/2022= No falls observed or reported per daughter. 11/13/2022= 1 fall since 10/09/2022 Goal status: ONGOING  6.  Pt will increase 6MWT by at least 40m (148ft) in order to demonstrate clinically significant improvement in cardiopulmonary endurance and community ambulation             Baseline: 10/09/2022= 820; 11/13/2022= 1200 feet             Goal status: MET  7. Pt will improve FGA by at least 3 points in order to demonstrate clinically significant improvement in balance and decreased risk for falls. Baseline: 18/30 Goal status: Initial   ASSESSMENT:  CLINICAL IMPRESSION: Treatment continued with focus on dynamic balance activities- Patient performed well with scavenger hunt activity- able to maneuver well - reaching overhead and squatting. She was more challenged with varying surfaces while performing dual task activity on dry erase board today. She  required constant reminders not to touch bar for balance unless absolutely necessary. She was fatigued at end of session - mostly challenged with use of BOSU ball and airex beam.   Pt will continue to benefit from skilled PT services to address balance impairments to reduce falls risk.    OBJECTIVE IMPAIRMENTS: Abnormal gait, decreased balance, decreased cognition, decreased coordination, decreased mobility, difficulty walking, and decreased strength.   ACTIVITY LIMITATIONS: carrying, lifting, and stairs  PARTICIPATION LIMITATIONS: cleaning, laundry, shopping, community activity, and yard work  PERSONAL FACTORS: 1 comorbidity: dementia  are also affecting patient's functional outcome.   REHAB POTENTIAL: Good  CLINICAL DECISION MAKING: Evolving/moderate complexity  EVALUATION COMPLEXITY: Low  PLAN:  PT FREQUENCY: 1-2x/week  PT DURATION: 12 weeks  PLANNED INTERVENTIONS: Therapeutic exercises, Therapeutic activity, Neuromuscular re-education, Balance training, Gait training, Patient/Family education, Self Care, Joint mobilization, Stair training, Vestibular training, Canalith repositioning, DME instructions, Cryotherapy, Moist heat, and Manual therapy  PLAN FOR NEXT SESSION: Continue with LE strengthening and balance training as appropriate.  Scavenger hunt next visit for continued higher level balance    Ollen Bowl, PT Physical Therapist- [facility]  11/15/2022, 1:27 PM

## 2022-11-20 ENCOUNTER — Ambulatory Visit: Payer: Medicare Other

## 2022-11-20 DIAGNOSIS — M6281 Muscle weakness (generalized): Secondary | ICD-10-CM

## 2022-11-20 DIAGNOSIS — R269 Unspecified abnormalities of gait and mobility: Secondary | ICD-10-CM

## 2022-11-20 DIAGNOSIS — R262 Difficulty in walking, not elsewhere classified: Secondary | ICD-10-CM

## 2022-11-20 DIAGNOSIS — R278 Other lack of coordination: Secondary | ICD-10-CM

## 2022-11-20 NOTE — Therapy (Signed)
OUTPATIENT PHYSICAL THERAPY NEURO TREATMENT  Patient Name: Jeanette Newton MRN: [id number] DOB:Jul 22, 1928, 89 y.o., female Today's Date: 11/20/2022   PCP: Dr. Emily Filbert REFERRING PROVIDER: Dr. Jennings Books  END OF SESSION:  PT End of Session - 11/20/22 0939     Visit Number 22    Number of Visits 24    Date for PT Re-Evaluation 11/29/22    Authorization Type [place] Time Period 09/06/22-11/29/22    Progress Note Due on Visit 30    PT Start Time 0930    Equipment Utilized During Treatment Gait belt    Activity Tolerance Patient tolerated treatment well;No increased pain                   Past Medical History:  Diagnosis Date   Cancer ([place]) 2005-2006   lymphoma   Hypothyroidism    Status post chemoradiation    lymphoma   Status post radiation therapy    Thyroid disease    Wears hearing aid in both ears    Past Surgical History:  Procedure Laterality Date   ABDOMINAL EXPLORATION SURGERY  2005-2006   Dr Pat Patrick   CATARACT EXTRACTION [facility] Left 07/28/2019   Procedure: CATARACT EXTRACTION PHACO AND INTRAOCULAR LENS PLACEMENT (IOC) LEFT 7.83  00:53.4;  Surgeon: Birder Robson, MD;  Location: [place];  Service: Ophthalmology;  Laterality: Left;   CATARACT EXTRACTION W/PHACO Right 08/25/2019   Procedure: CATARACT EXTRACTION PHACO AND INTRAOCULAR LENS PLACEMENT (IOC) RIGHT 6.04 00:41.0;  Surgeon: Birder Robson, MD;  Location: [place];  Service: Ophthalmology;  Laterality: Right;   CHOLECYSTECTOMY N/A 05/25/2016   Procedure: LAPAROSCOPIC CHOLECYSTECTOMY WITH INTRAOPERATIVE CHOLANGIOGRAM;  Surgeon: Robert Bellow, MD;  Location: [facility];  Service: General;  Laterality: N/A;   ELBOW FRACTURE SURGERY Right    EYE SURGERY     LAPAROTOMY N/A 05/30/2019   Procedure: EXPLORATORY LAPAROTOMYand small bowel resection;  Surgeon: Jules Husbands, MD;  Location: [facility];  Service: General;  Laterality: N/A;   XI ROBOTIC  ASSISTED VENTRAL HERNIA N/A 11/06/2019   Procedure: XI ROBOTIC ASSISTED VENTRAL HERNIA REPAIR WITH MESH;  Surgeon: Jules Husbands, MD;  Location: [facility];  Service: General;  Laterality: N/A;   Patient Active Problem List   Diagnosis Date Noted   Bowel perforation ([place]) 05/30/2019   Calculus of gallbladder with acute cholecystitis without obstruction 05/15/2016    ONSET DATE: Over 6 months  REFERRING DIAG: R26.89 (ICD-10-CM) - Imbalance   THERAPY DIAG:  Difficulty in walking, not elsewhere classified  Muscle weakness (generalized)  Abnormality of gait and mobility  Other lack of coordination  Rationale for Evaluation and Treatment: Rehabilitation  SUBJECTIVE:  SUBJECTIVE STATEMENT: Patient reports doing well without any issues today.  Pt accompanied by: family member-[name]  PERTINENT HISTORY: Patient is a 89 year old female with Mixed Alzheimers and vascular dementia who lives alone yet daughter stays with her during the day. Reports does not drive and not happy about that yet has been independent with all ADLs- some imbalance however daughter denies any recent falls.   PAIN:  Are you having pain? No  PRECAUTIONS: Fall  WEIGHT BEARING RESTRICTIONS: No  FALLS: Has patient fallen in last 6 months? No    PATIENT GOALS: Caregiver goal- is to improve her stability and keep her safe in her home.2  OBJECTIVE:    Neuro Re-Ed:   Dynamic marching on airex pad (VC to not use UE support) x 20-30 reps (VC also for increased step height)   Dynamic head turning while standing on airex pad  -Horizontal- x 10 x 2 sets  -Vertical x 10 reps x 2 sets  [place] tapping onto 4 colored cones with dual cogn task- several min  Walking lunge squat down and back in // bars x 2 sets  Toe walking in // bars - down and back x 3  Heel walking in // bars - down and back x 3  Sit to stand with 2kg ball x 15 reps  Side stepping over 1/2 foam x 2 and orange hurdle in // bars (VC to keep feet pointing straight ahead)  x4  Forward step up/over 3 objects in // bars (same as above) x 4.     Gait- horizontal head turns in hallway - calling out items on sticky notes.        Pt educated throughout session about proper posture and technique with exercises. Improved exercise technique, movement at target joints, use of target muscles after min to mod verbal, visual, tactile cues.    PATIENT EDUCATION: Education details: purpose of PT and how this service may assist in improving her balance Person educated: Patient, Child(ren), and [name] Education method: Explanation, Demonstration, Tactile cues, and Verbal cues Education comprehension: verbalized understanding, returned demonstration, verbal cues required, tactile cues required, and needs further education  HOME EXERCISE PROGRAM: Access Code: [email or web address] URL: [email or web address] Date: 09/11/2022 Prepared by: Sande Brothers  Exercises - Seated Hip Flexion March with Ankle Weights  - 1 x daily - 3 x weekly - 3 sets - 10 reps - Seated Hip Abduction with Resistance  - 1 x daily - 3 x weekly - 3 sets - 10 reps - Seated Hip Adduction Isometrics with Ball  - 1 x daily - 3 x weekly - 3 sets - 10 reps - Seated Long Arc Quad with Ankle Weight  - 1 x daily - 3 x weekly - 3 sets - 10 reps - Seated Hamstring Curl with Anchored Resistance  - 1 x daily - 3 x weekly - 3 sets - 10 reps - Seated Heel Raise  - 1 x daily - 3 x weekly - 3 sets - 10 reps - Seated Heel Toe Raises  - 1 x daily - 3 x weekly - 3 sets - 10 reps - Sit to Stand with Arms Crossed  - 1 x daily - 3 x weekly - 3 sets - 10 reps  GOALS: Goals reviewed with patient? Yes  SHORT TERM GOALS: Target date: 10/18/2022  Pt and caregiver (daughter) will be independent with HEP in order to improve strength and balance in order to decrease fall risk and improve function at home and work Baseline: EVAL- Patient has no formal HEP  and admits to not exercising.  Goal status:  INITIAL   LONG TERM GOALS: Target date: 11/29/2022  Pt will improve FOTO to target score  of > 57 to display perceived improvements in ability to complete ADL's.  Baseline: EVAL= 56  Goal status: INITIAL  2.  Pt will decrease 5TSTS by at least 3 seconds in order to demonstrate clinically significant improvement in LE strength. Baseline: EVAL=18.89 sec without UE support; 10/09/2022= 13.70 sec without UE  Goal status: MET  3.  Pt will decrease TUG to below 14 seconds/decrease in order to demonstrate decreased fall risk. Baseline: EVAL= 17.23 sec without UE support; 10/09/2022= 13.36 sec  Goal status: MET  4.  Pt will improve BERG by at least 3 points in order to demonstrate clinically significant improvement in balance.  Baseline: EVAL=45/56; 10/09/2022=51/56  Goal status: MET  5.  Patient will deny any falls during episode of care for optimal safety with all household and community distances  Baseline: EVAL= Unsteady yet no reported falls in last 6 months.  10/09/2022= No falls observed or reported per daughter. 11/13/2022= 1 fall since 10/09/2022 Goal status: ONGOING  6.  Pt will increase 6MWT by at least 74m (168ft) in order to demonstrate clinically significant improvement in cardiopulmonary endurance and community ambulation             Baseline: 10/09/2022= 820; 11/13/2022= 1200 feet             Goal status: MET  7. Pt will improve FGA by at least 3 points in order to demonstrate clinically significant improvement in balance and decreased risk for falls. Baseline: 18/30 Goal status: Initial   ASSESSMENT:  CLINICAL IMPRESSION: Patient seemed more distracted during session today and more difficulty following cues for exercises. She did improve some with repetition and practice. She was most challenged with dual task activities and unable to coordinate her movement and required increased time to complete tasks.   Pt will continue to benefit from skilled PT services to address balance  impairments to reduce falls risk.    OBJECTIVE IMPAIRMENTS: Abnormal gait, decreased balance, decreased cognition, decreased coordination, decreased mobility, difficulty walking, and decreased strength.   ACTIVITY LIMITATIONS: carrying, lifting, and stairs  PARTICIPATION LIMITATIONS: cleaning, laundry, shopping, community activity, and yard work  PERSONAL FACTORS: 1 comorbidity: dementia  are also affecting patient's functional outcome.   REHAB POTENTIAL: Good  CLINICAL DECISION MAKING: Evolving/moderate complexity  EVALUATION COMPLEXITY: Low  PLAN:  PT FREQUENCY: 1-2x/week  PT DURATION: 12 weeks  PLANNED INTERVENTIONS: Therapeutic exercises, Therapeutic activity, Neuromuscular re-education, Balance training, Gait training, Patient/Family education, Self Care, Joint mobilization, Stair training, Vestibular training, Canalith repositioning, DME instructions, Cryotherapy, Moist heat, and Manual therapy  PLAN FOR NEXT SESSION: Continue with LE strengthening and balance training as appropriate.  Scavenger hunt next visit for continued higher level balance    Ollen Bowl, PT Physical Therapist- [facility]  11/20/2022, 3:47 PM

## 2022-11-22 ENCOUNTER — Ambulatory Visit: Payer: Medicare Other

## 2022-11-22 DIAGNOSIS — R262 Difficulty in walking, not elsewhere classified: Secondary | ICD-10-CM | POA: Diagnosis not present

## 2022-11-22 DIAGNOSIS — M6281 Muscle weakness (generalized): Secondary | ICD-10-CM

## 2022-11-22 DIAGNOSIS — R41841 Cognitive communication deficit: Secondary | ICD-10-CM

## 2022-11-22 DIAGNOSIS — R278 Other lack of coordination: Secondary | ICD-10-CM

## 2022-11-22 DIAGNOSIS — R269 Unspecified abnormalities of gait and mobility: Secondary | ICD-10-CM

## 2022-11-22 NOTE — Therapy (Signed)
OUTPATIENT PHYSICAL THERAPY NEURO TREATMENT  Patient Name: Jeanette Newton MRN: [id number] DOB:[birth year], 89 y.o., female Today's Date: 11/22/2022   PCP: Dr. Emily Filbert REFERRING PROVIDER: Dr. Jennings Books  END OF SESSION:  PT End of Session - 11/22/22 0930     Visit Number 23    Number of Visits 24    Date for PT Re-Evaluation 11/29/22    Authorization Type [place] Time Period 09/06/22-11/29/22    Progress Note Due on Visit 30    PT Start Time 0930    Equipment Utilized During Treatment Gait belt    Activity Tolerance Patient tolerated treatment well;No increased pain                   Past Medical History:  Diagnosis Date   Cancer ([place]) 2005-2006   lymphoma   Hypothyroidism    Status post chemoradiation    lymphoma   Status post radiation therapy    Thyroid disease    Wears hearing aid in both ears    Past Surgical History:  Procedure Laterality Date   ABDOMINAL EXPLORATION SURGERY  2005-2006   Dr Pat Patrick   CATARACT EXTRACTION [facility] Left 07/28/2019   Procedure: CATARACT EXTRACTION PHACO AND INTRAOCULAR LENS PLACEMENT (IOC) LEFT 7.83  00:53.4;  Surgeon: Birder Robson, MD;  Location: [place];  Service: Ophthalmology;  Laterality: Left;   CATARACT EXTRACTION W/PHACO Right 08/25/2019   Procedure: CATARACT EXTRACTION PHACO AND INTRAOCULAR LENS PLACEMENT (IOC) RIGHT 6.04 00:41.0;  Surgeon: Birder Robson, MD;  Location: [place];  Service: Ophthalmology;  Laterality: Right;   CHOLECYSTECTOMY N/A 05/25/2016   Procedure: LAPAROSCOPIC CHOLECYSTECTOMY WITH INTRAOPERATIVE CHOLANGIOGRAM;  Surgeon: Robert Bellow, MD;  Location: [facility];  Service: General;  Laterality: N/A;   ELBOW FRACTURE SURGERY Right    EYE SURGERY     LAPAROTOMY N/A 05/30/2019   Procedure: EXPLORATORY LAPAROTOMYand small bowel resection;  Surgeon: Jules Husbands, MD;  Location: [facility];  Service: General;  Laterality: N/A;   XI ROBOTIC  ASSISTED VENTRAL HERNIA N/A 11/06/2019   Procedure: XI ROBOTIC ASSISTED VENTRAL HERNIA REPAIR WITH MESH;  Surgeon: Jules Husbands, MD;  Location: [facility];  Service: General;  Laterality: N/A;   Patient Active Problem List   Diagnosis Date Noted   Bowel perforation ([place]) 05/30/2019   Calculus of gallbladder with acute cholecystitis without obstruction 05/15/2016    ONSET DATE: Over 6 months  REFERRING DIAG: R26.89 (ICD-10-CM) - Imbalance   THERAPY DIAG:  Difficulty in walking, not elsewhere classified  Muscle weakness (generalized)  Abnormality of gait and mobility  Other lack of coordination  Cognitive communication deficit  Rationale for Evaluation and Treatment: Rehabilitation  SUBJECTIVE:  SUBJECTIVE STATEMENT: Patient reports doing well without any complaints.  Pt accompanied by: family member-[name]  PERTINENT HISTORY: Patient is a 89 year old female with Mixed Alzheimers and vascular dementia who lives alone yet daughter stays with her during the day. Reports does not drive and not happy about that yet has been independent with all ADLs- some imbalance however daughter denies any recent falls.   PAIN:  Are you having pain? No  PRECAUTIONS: Fall  WEIGHT BEARING RESTRICTIONS: No  FALLS: Has patient fallen in last 6 months? No    PATIENT GOALS: Caregiver goal- is to improve her stability and keep her safe in her home.2  OBJECTIVE:     Therex-  -step ups each LE x 10 reps  - Eccentric step down  x 10 reps with right LE (focusing on eccentric left LE)  - dynamic side step up/over orange hurdle x 15 reps - calf raises on 1/2 foam 2 sets of 10 reps - Toe raises on 1/2 foam 2 sets of 10 reps -Sit to stand x 10 with right foot on airex pad x 2 sets  - deadlift - using 15 lb kettle bell- x4 (unable to maintain good posture) so removed weight to avoid potential injury and patient performed without weight with improved ability to look ahead x 12 reps.  -Donkey kicks x 13      Neuro  Re-Ed:  Tandem stand on 1/2 foam x 30 sec x 3 Standing on airex pad with dynamic head turns/nods eyes open then closed x 20 reps each Step tap onto 6 in steps x 10 reps each without UE support.               Pt educated throughout session about proper posture and technique with exercises. Improved exercise technique, movement at target joints, use of target muscles after min to mod verbal, visual, tactile cues.    PATIENT EDUCATION: Education details: purpose of PT and how this service may assist in improving her balance Person educated: Patient, Child(ren), and [name] Education method: Explanation, Demonstration, Tactile cues, and Verbal cues Education comprehension: verbalized understanding, returned demonstration, verbal cues required, tactile cues required, and needs further education  HOME EXERCISE PROGRAM: Access Code: [email or web address] URL: [email or web address] Date: 09/11/2022 Prepared by: Sande Brothers  Exercises - Seated Hip Flexion March with Ankle Weights  - 1 x daily - 3 x weekly - 3 sets - 10 reps - Seated Hip Abduction with Resistance  - 1 x daily - 3 x weekly - 3 sets - 10 reps - Seated Hip Adduction Isometrics with Ball  - 1 x daily - 3 x weekly - 3 sets - 10 reps - Seated Long Arc Quad with Ankle Weight  - 1 x daily - 3 x weekly - 3 sets - 10 reps - Seated Hamstring Curl with Anchored Resistance  - 1 x daily - 3 x weekly - 3 sets - 10 reps - Seated Heel Raise  - 1 x daily - 3 x weekly - 3 sets - 10 reps - Seated Heel Toe Raises  - 1 x daily - 3 x weekly - 3 sets - 10 reps - Sit to Stand with Arms Crossed  - 1 x daily - 3 x weekly - 3 sets - 10 reps  GOALS: Goals reviewed with patient? Yes  SHORT TERM GOALS: Target date: 10/18/2022  Pt and caregiver (daughter) will be independent with HEP in order to improve strength and balance in order to decrease fall risk and improve function at home and work  Baseline: EVAL- Patient has no  formal HEP and admits to not exercising.  Goal status: INITIAL   LONG TERM GOALS: Target date: 11/29/2022  Pt will improve FOTO to target score  of > 57 to display perceived improvements in ability to complete ADL's.  Baseline: EVAL= 56  Goal status: INITIAL  2.  Pt will decrease 5TSTS by at least 3 seconds in order to demonstrate clinically significant improvement in LE strength. Baseline: EVAL=18.89 sec without UE support; 10/09/2022= 13.70 sec without UE  Goal status: MET  3.  Pt will decrease TUG to below 14 seconds/decrease in order to demonstrate decreased fall risk. Baseline: EVAL= 17.23 sec without UE support; 10/09/2022= 13.36 sec  Goal status: MET  4.  Pt will improve BERG by at least 3 points in order to demonstrate clinically significant improvement in balance.  Baseline: EVAL=45/56; 10/09/2022=51/56  Goal status: MET  5.  Patient will deny any falls during episode of care for optimal safety with all household and community distances  Baseline: EVAL= Unsteady yet no reported falls in last 6 months.  10/09/2022= No falls observed or reported per daughter. 11/13/2022= 1 fall since 10/09/2022 Goal status: ONGOING  6.  Pt will increase 6MWT by at least 77m (125ft) in order to demonstrate clinically significant improvement in cardiopulmonary endurance and community ambulation             Baseline: 10/09/2022= 820; 11/13/2022= 1200 feet             Goal status: MET  7. Pt will improve FGA by at least 3 points in order to demonstrate clinically significant improvement in balance and decreased risk for falls. Baseline: 18/30 Goal status: Initial   ASSESSMENT:  CLINICAL IMPRESSION: Patient responded better today- able to follow commands well except for explanation of deadlifts. No LOB with balance activities and patient required only mimimal rest breaks today.    Pt will continue to benefit from skilled PT services to address balance impairments to reduce falls risk.    OBJECTIVE  IMPAIRMENTS: Abnormal gait, decreased balance, decreased cognition, decreased coordination, decreased mobility, difficulty walking, and decreased strength.   ACTIVITY LIMITATIONS: carrying, lifting, and stairs  PARTICIPATION LIMITATIONS: cleaning, laundry, shopping, community activity, and yard work  PERSONAL FACTORS: 1 comorbidity: dementia  are also affecting patient's functional outcome.   REHAB POTENTIAL: Good  CLINICAL DECISION MAKING: Evolving/moderate complexity  EVALUATION COMPLEXITY: Low  PLAN:  PT FREQUENCY: 1-2x/week  PT DURATION: 12 weeks  PLANNED INTERVENTIONS: Therapeutic exercises, Therapeutic activity, Neuromuscular re-education, Balance training, Gait training, Patient/Family education, Self Care, Joint mobilization, Stair training, Vestibular training, Canalith repositioning, DME instructions, Cryotherapy, Moist heat, and Manual therapy  PLAN FOR NEXT SESSION: Continue with LE strengthening and balance training as appropriate.  Scavenger hunt next visit for continued higher level balance    Ollen Bowl, PT Physical Therapist- [facility]  11/22/2022, 9:40 AM

## 2022-11-26 NOTE — Therapy (Signed)
OUTPATIENT PHYSICAL THERAPY NEURO TREATMENT/RECERTIFICATION  Patient Name: Jeanette Newton MRN: [id number] DOB:[birth year], 89 y.o., female Today's Date: 11/27/2022   PCP: Dr. Emily Filbert REFERRING PROVIDER: Dr. Jennings Books  END OF SESSION:  PT End of Session - 11/27/22 0850     Visit Number 24    Number of Visits 36    Date for PT Re-Evaluation 02/19/23    Authorization Type [place] Time Period 09/06/22-11/29/22    Progress Note Due on Visit 30    PT Start Time 0848    PT Stop Time 0928    PT Time Calculation (min) 40 min    Equipment Utilized During Treatment Gait belt    Activity Tolerance Patient tolerated treatment well;No increased pain    Behavior During Therapy [facility] for tasks assessed/performed                   Past Medical History:  Diagnosis Date   Cancer 2005-2006   lymphoma   Hypothyroidism    Status post chemoradiation    lymphoma   Status post radiation therapy    Thyroid disease    Wears hearing aid in both ears    Past Surgical History:  Procedure Laterality Date   ABDOMINAL EXPLORATION SURGERY  2005-2006   Dr Pat Patrick   CATARACT EXTRACTION [facility] Left 07/28/2019   Procedure: CATARACT EXTRACTION PHACO AND INTRAOCULAR LENS PLACEMENT (IOC) LEFT 7.83  00:53.4;  Surgeon: Birder Robson, MD;  Location: [place];  Service: Ophthalmology;  Laterality: Left;   CATARACT EXTRACTION W/PHACO Right 08/25/2019   Procedure: CATARACT EXTRACTION PHACO AND INTRAOCULAR LENS PLACEMENT (IOC) RIGHT 6.04 00:41.0;  Surgeon: Birder Robson, MD;  Location: [place];  Service: Ophthalmology;  Laterality: Right;   CHOLECYSTECTOMY N/A 05/25/2016   Procedure: LAPAROSCOPIC CHOLECYSTECTOMY WITH INTRAOPERATIVE CHOLANGIOGRAM;  Surgeon: Robert Bellow, MD;  Location: [facility];  Service: General;  Laterality: N/A;   ELBOW FRACTURE SURGERY Right    EYE SURGERY     LAPAROTOMY N/A 05/30/2019   Procedure: EXPLORATORY LAPAROTOMYand  small bowel resection;  Surgeon: Jules Husbands, MD;  Location: [facility];  Service: General;  Laterality: N/A;   XI ROBOTIC ASSISTED VENTRAL HERNIA N/A 11/06/2019   Procedure: XI ROBOTIC ASSISTED VENTRAL HERNIA REPAIR WITH MESH;  Surgeon: Jules Husbands, MD;  Location: [facility];  Service: General;  Laterality: N/A;   Patient Active Problem List   Diagnosis Date Noted   Bowel perforation 05/30/2019   Calculus of gallbladder with acute cholecystitis without obstruction 05/15/2016    ONSET DATE: Over 6 months  REFERRING DIAG: R26.89 (ICD-10-CM) - Imbalance   THERAPY DIAG:  Muscle weakness (generalized)  Difficulty in walking, not elsewhere classified  Abnormality of gait and mobility  Other lack of coordination  Cognitive communication deficit  Rationale for Evaluation and Treatment: Rehabilitation  SUBJECTIVE:  SUBJECTIVE STATEMENT: Patient reports doing well without any complaints.  Pt accompanied by: family member-[name]  PERTINENT HISTORY: Patient is a 89 year old female with Mixed Alzheimers and vascular dementia who lives alone yet daughter stays with her during the day. Reports does not drive and not happy about that yet has been independent with all ADLs- some imbalance however daughter denies any recent falls.   PAIN:  Are you having pain? No  PRECAUTIONS: Fall  WEIGHT BEARING RESTRICTIONS: No  FALLS: Has patient fallen in last 6 months? No    PATIENT GOALS: Caregiver goal- is to improve her stability and keep her safe in her home.2  OBJECTIVE:     Therex-  -step ups and over orange hurdles in // bars down and back x 6 times   - Calf raises on 1/2 foam x 15 reps -Toe raises on 1/2 foam x 15 reps   - Modified lunge squat each LE x 15 reps  - Standing hip ext - alt LE x 15 reps  Sit to stand 2 sets of 10 reps Resistive walking 2# AW x 300 feet - mild staggered gait at times yet no LOB   Physical therapy treatment session today consisted of completing assessment of goals and  administration of testing as demonstrated and documented in flow sheet, treatment, and goals section of this note. Addition treatments may be found below.               Pt educated throughout session about proper posture and technique with exercises. Improved exercise technique, movement at target joints, use of target muscles after min to mod verbal, visual, tactile cues.    PATIENT EDUCATION: Education details: purpose of PT and how this service may assist in improving her balance Person educated: Patient, Child(ren), and [name] Education method: Explanation, Demonstration, Tactile cues, and Verbal cues Education comprehension: verbalized understanding, returned demonstration, verbal cues required, tactile cues required, and needs further education  HOME EXERCISE PROGRAM: Access Code: [email or web address] URL: [email or web address] Date: 09/11/2022 Prepared by: Sande Brothers  Exercises - Seated Hip Flexion March with Ankle Weights  - 1 x daily - 3 x weekly - 3 sets - 10 reps - Seated Hip Abduction with Resistance  - 1 x daily - 3 x weekly - 3 sets - 10 reps - Seated Hip Adduction Isometrics with Ball  - 1 x daily - 3 x weekly - 3 sets - 10 reps - Seated Long Arc Quad with Ankle Weight  - 1 x daily - 3 x weekly - 3 sets - 10 reps - Seated Hamstring Curl with Anchored Resistance  - 1 x daily - 3 x weekly - 3 sets - 10 reps - Seated Heel Raise  - 1 x daily - 3 x weekly - 3 sets - 10 reps - Seated Heel Toe Raises  - 1 x daily - 3 x weekly - 3 sets - 10 reps - Sit to Stand with Arms Crossed  - 1 x daily - 3 x weekly - 3 sets - 10 reps  GOALS: Goals reviewed with patient? Yes  SHORT TERM GOALS: Target date: 10/18/2022  Pt and caregiver (daughter) will be independent with HEP in order to improve strength and balance in order to decrease fall risk and improve function at home and work Baseline: EVAL- Patient has no formal HEP and admits to not exercising.  Goal  status: INITIAL   LONG TERM GOALS: Target date: 02/19/2023  Pt will improve FOTO to target score  of > 57 to display perceived improvements  in ability to complete ADL's.  Baseline: EVAL= 56; Will reassess next visit Goal status: ONGOING  2.  Pt will decrease 5TSTS by at least 3 seconds in order to demonstrate clinically significant improvement in LE strength. Baseline: EVAL=18.89 sec without UE support; 10/09/2022= 13.70 sec without UE  Goal status: MET  3.  Pt will decrease TUG to below 14 seconds/decrease in order to demonstrate decreased fall risk. Baseline: EVAL= 17.23 sec without UE support; 10/09/2022= 13.36 sec  Goal status: MET  4.  Pt will improve BERG by at least 3 points in order to demonstrate clinically significant improvement in balance.  Baseline: EVAL=45/56; 10/09/2022=51/56  Goal status: MET  5.  Patient will deny any falls during episode of care for optimal safety with all household and community distances  Baseline: EVAL= Unsteady yet no reported falls in last 6 months.  10/09/2022= No falls observed or reported per daughter. 11/13/2022= 1 fall since 10/09/2022; 11/27/2022= No further falls since last report on 11/13/2022. Goal status: ONGOING  6.  Pt will increase 6MWT by at least 64m (180ft) in order to demonstrate clinically significant improvement in cardiopulmonary endurance and community ambulation             Baseline: 10/09/2022= 820; 11/13/2022= 1200 feet             Goal status: MET  7. Pt will improve FGA by at least 3 points in order to demonstrate clinically significant improvement in balance and decreased risk for falls. Baseline: 18/30; 11/27/2022= 20/30 Goal status: PROGRESSING   ASSESSMENT:  CLINICAL IMPRESSION: Patient presents with good motivation for today's recert session. Overall patient has made excellent progress overall- most goals met along the way and now focusing mostly on continued LE Strengthening and dynamic balance with 1 fall reported during  this cert. She did demo continued progress with dynamic balance scoring higher on FGA with continued difficulty with higher level activities including narrowed walk and head turns or eyes closed. Patient has impaired cognition and will likely require her daughter to assist her in her daily HEP to remain compliant. Patient's condition has the potential to improve in response to therapy. Maximum improvement is yet to be obtained. The anticipated improvement is attainable and reasonable in a generally predictable time. Pt will continue to benefit from skilled PT services to address balance impairments to reduce falls risk.    OBJECTIVE IMPAIRMENTS: Abnormal gait, decreased balance, decreased cognition, decreased coordination, decreased mobility, difficulty walking, and decreased strength.   ACTIVITY LIMITATIONS: carrying, lifting, and stairs  PARTICIPATION LIMITATIONS: cleaning, laundry, shopping, community activity, and yard work  PERSONAL FACTORS: 1 comorbidity: dementia  are also affecting patient's functional outcome.   REHAB POTENTIAL: Good  CLINICAL DECISION MAKING: Evolving/moderate complexity  EVALUATION COMPLEXITY: Low  PLAN:  PT FREQUENCY: 1x/week  PT DURATION: 12 weeks  PLANNED INTERVENTIONS: Therapeutic exercises, Therapeutic activity, Neuromuscular re-education, Balance training, Gait training, Patient/Family education, Self Care, Joint mobilization, Stair training, Vestibular training, Canalith repositioning, DME instructions, Cryotherapy, Moist heat, and Manual therapy  PLAN FOR NEXT SESSION: Continue with LE strengthening and balance training as appropriate.  higher level balance activities next visit.     Ollen Bowl, PT Physical Therapist- [facility]  11/27/2022, 8:21 PM

## 2022-11-27 ENCOUNTER — Ambulatory Visit: Payer: Medicare Other | Attending: Neurology

## 2022-11-27 DIAGNOSIS — R269 Unspecified abnormalities of gait and mobility: Secondary | ICD-10-CM | POA: Diagnosis present

## 2022-11-27 DIAGNOSIS — R41841 Cognitive communication deficit: Secondary | ICD-10-CM

## 2022-11-27 DIAGNOSIS — R278 Other lack of coordination: Secondary | ICD-10-CM

## 2022-11-27 DIAGNOSIS — M6281 Muscle weakness (generalized): Secondary | ICD-10-CM

## 2022-11-27 DIAGNOSIS — R262 Difficulty in walking, not elsewhere classified: Secondary | ICD-10-CM | POA: Diagnosis present

## 2022-11-29 ENCOUNTER — Ambulatory Visit: Payer: Medicare Other

## 2022-12-04 ENCOUNTER — Ambulatory Visit: Payer: Medicare Other

## 2022-12-06 ENCOUNTER — Ambulatory Visit: Payer: Medicare Other

## 2022-12-06 ENCOUNTER — Other Ambulatory Visit: Payer: Medicare Other

## 2022-12-06 VITALS — BP 128/60 | HR 72

## 2022-12-06 DIAGNOSIS — R269 Unspecified abnormalities of gait and mobility: Secondary | ICD-10-CM

## 2022-12-06 DIAGNOSIS — R41841 Cognitive communication deficit: Secondary | ICD-10-CM

## 2022-12-06 DIAGNOSIS — Z515 Encounter for palliative care: Secondary | ICD-10-CM

## 2022-12-06 DIAGNOSIS — M6281 Muscle weakness (generalized): Secondary | ICD-10-CM | POA: Diagnosis not present

## 2022-12-06 DIAGNOSIS — R262 Difficulty in walking, not elsewhere classified: Secondary | ICD-10-CM

## 2022-12-06 DIAGNOSIS — R278 Other lack of coordination: Secondary | ICD-10-CM

## 2022-12-06 NOTE — Therapy (Signed)
OUTPATIENT PHYSICAL THERAPY NEURO TREATMENT  Patient Name: Jeanette Newton MRN: [id number] DOB:1928-03-07, 89 y.o., female Today's Date: 12/06/2022   PCP: Dr. Bethann Newton REFERRING PROVIDER: Dr. Cristopher Newton  END OF SESSION:  PT End of Session - 12/06/22 1134     Visit Number 25    Number of Visits 36    Date for PT Re-Evaluation 02/19/23    Authorization Type [employer]    Authorization Time Period 09/06/22-11/29/22    Progress Note Due on Visit 30    PT Start Time 1135    PT Stop Time 1222    PT Time Calculation (min) 47 min    Equipment Utilized During Treatment Gait belt    Activity Tolerance Patient tolerated treatment well;No increased pain    Behavior During Therapy [facility] for tasks assessed/performed                   Past Medical History:  Diagnosis Date   Cancer 2005-2006   lymphoma   Hypothyroidism    Status post chemoradiation    lymphoma   Status post radiation therapy    Thyroid disease    Wears hearing aid in both ears    Past Surgical History:  Procedure Laterality Date   ABDOMINAL EXPLORATION SURGERY  2005-2006   Dr Jeanette Newton   CATARACT EXTRACTION [facility] Left 07/28/2019   Procedure: CATARACT EXTRACTION PHACO AND INTRAOCULAR LENS PLACEMENT (IOC) LEFT 7.83  00:53.4;  Surgeon: Jeanette Manila, MD;  Location: [facility];  Service: Ophthalmology;  Laterality: Left;   CATARACT EXTRACTION W/PHACO Right 08/25/2019   Procedure: CATARACT EXTRACTION PHACO AND INTRAOCULAR LENS PLACEMENT (IOC) RIGHT 6.04 00:41.0;  Surgeon: Jeanette Manila, MD;  Location: [facility];  Service: Ophthalmology;  Laterality: Right;   CHOLECYSTECTOMY N/A 05/25/2016   Procedure: LAPAROSCOPIC CHOLECYSTECTOMY WITH INTRAOPERATIVE CHOLANGIOGRAM;  Surgeon: Jeanette Mayotte, MD;  Location: [facility] OR[facility];  Service: General;  Laterality: N/A;   ELBOW FRACTURE SURGERY Right    EYE SURGERY     LAPAROTOMY N/A 05/30/2019   Procedure: EXPLORATORY LAPAROTOMYand small bowel  resection;  Surgeon: Jeanette Ro, MD;  Location: [facility];  Service: General;  Laterality: N/A;   XI ROBOTIC ASSISTED VENTRAL HERNIA N/A 11/06/2019   Procedure: XI ROBOTIC ASSISTED VENTRAL HERNIA REPAIR WITH MESH;  Surgeon: Jeanette Ro, MD;  Location: [facility] OR[facility];  Service: General;  Laterality: N/A;   Patient Active Problem List   Diagnosis Date Noted   Bowel perforation 05/30/2019   Calculus of gallbladder with acute cholecystitis without obstruction 05/15/2016    ONSET DATE: Over 6 months  REFERRING DIAG: R26.89 (ICD-10-CM) - Imbalance   THERAPY DIAG:  Muscle weakness (generalized)  Difficulty in walking, not elsewhere classified  Abnormality of gait and mobility  Other lack of coordination  Cognitive communication deficit  Rationale for Evaluation and Treatment: Rehabilitation  SUBJECTIVE:  SUBJECTIVE STATEMENT: Patient reports doing well again today and no issues otherwise  Pt accompanied by: family member-Jeanette Newton  PERTINENT HISTORY: Patient is a 89 year old female with Mixed Alzheimers and vascular dementia who lives alone yet daughter stays with her during the day. Reports does not drive and not happy about that yet has been independent with all ADLs- some imbalance however daughter denies any recent falls.   PAIN:  Are you having pain? No  PRECAUTIONS: Fall  WEIGHT BEARING RESTRICTIONS: No  FALLS: Has patient fallen in last 6 months? No    PATIENT GOALS: Caregiver goal- is to improve her stability and keep her safe in her home.2  OBJECTIVE:     Therex-  -step up onto 6" block without any AW x 10  - Step up and down on/off  6" block with 3# AW  -Side step up/over orange hurdles in // bars down and back x 6 times  - Gait in clinic approx 175 feet with 4# AW   - Sit to stand x 10 reps - Standing hip ext x 10 reps 3# AW alt LE - Gait in clinic approx 175 feet with 3# AW  - Calf raises on 1/2 foam x 15 reps -Side steps up/over orange hurdles 3# x 15 reps  - Gait in clinic approx 175  feet with 3# AW  - Modified lunge squat each LE x 15 reps  - Seated LAQ - alt LE x 15 reps with 3# AW - Gait in clinic approx 175 feet with 3# AW    Physical therapy treatment session today consisted of completing assessment of goals and administration of testing as demonstrated and documented in flow sheet, treatment, and goals section of this note. Addition treatments may be found below.               Pt educated throughout session about proper posture and technique with exercises. Improved exercise technique, movement at target joints, use of target muscles after min to mod verbal, visual, tactile cues.    PATIENT EDUCATION: Education details: purpose of PT and how this service may assist in improving her balance Person educated: Patient, Child(ren), and adult dtr- Jeanette Newton Education method: Explanation, Demonstration, Tactile cues, and Verbal cues Education comprehension: verbalized understanding, returned demonstration, verbal cues required, tactile cues required, and needs further education  HOME EXERCISE PROGRAM: Access Code: [email or web address] URL: [email or web address] Date: 09/11/2022 Prepared by: Jeanette Newton  Exercises - Seated Hip Flexion March with Ankle Weights  - 1 x daily - 3 x weekly - 3 sets - 10 reps - Seated Hip Abduction with Resistance  - 1 x daily - 3 x weekly - 3 sets - 10 reps - Seated Hip Adduction Isometrics with Ball  - 1 x daily - 3 x weekly - 3 sets - 10 reps - Seated Long Arc Quad with Ankle Weight  - 1 x daily - 3 x weekly - 3 sets - 10 reps - Seated Hamstring Curl with Anchored Resistance  - 1 x daily - 3 x weekly - 3 sets - 10 reps - Seated Heel Raise  - 1 x daily - 3 x weekly - 3 sets - 10 reps - Seated Heel Toe Raises  - 1 x daily - 3 x weekly - 3 sets - 10 reps - Sit to Stand with Arms Crossed  - 1 x daily - 3 x weekly - 3 sets - 10 reps  GOALS: Goals reviewed with patient? Yes  SHORT TERM GOALS: Target date:  10/18/2022  Pt and caregiver (daughter) will be independent  with HEP in order to improve strength and balance in order to decrease fall risk and improve function at home and work Baseline: EVAL- Patient has no formal HEP and admits to not exercising.  Goal status: INITIAL   LONG TERM GOALS: Target date: 02/19/2023  Pt will improve FOTO to target score  of > 57 to display perceived improvements in ability to complete ADL's.  Baseline: EVAL= 56; Will reassess next visit Goal status: ONGOING  2.  Pt will decrease 5TSTS by at least 3 seconds in order to demonstrate clinically significant improvement in LE strength. Baseline: EVAL=18.89 sec without UE support; 10/09/2022= 13.70 sec without UE  Goal status: MET  3.  Pt will decrease TUG to below 14 seconds/decrease in order to demonstrate decreased fall risk. Baseline: EVAL= 17.23 sec without UE support; 10/09/2022= 13.36 sec  Goal status: MET  4.  Pt will improve BERG by at least 3 points in order to demonstrate clinically significant improvement in balance.  Baseline: EVAL=45/56; 10/09/2022=51/56  Goal status: MET  5.  Patient will deny any falls during episode of care for optimal safety with all household and community distances  Baseline: EVAL= Unsteady yet no reported falls in last 6 months.  10/09/2022= No falls observed or reported per daughter. 11/13/2022= 1 fall since 10/09/2022; 11/27/2022= No further falls since last report on 11/13/2022. Goal status: ONGOING  6.  Pt will increase by at least 26m (169ft) in order to demonstrate clinically significant improvement in cardiopulmonary endurance and community ambulation             Baseline: 10/09/2022= 820; 11/13/2022= 1200 feet             Goal status: MET  7. Pt will improve FGA by at least 3 points in order to demonstrate clinically significant improvement in balance and decreased risk for falls. Baseline: 18/30; 11/27/2022= 20/30 Goal status: PROGRESSING   ASSESSMENT:  CLINICAL  IMPRESSION: Patient presented with good motivation for today's session. She completed a circuit style workout today with good response to increased activities and walking with very minimal rest breaks. She was able to follow all cues for correct technique and no specific LOB during session.  Pt will continue to benefit from skilled PT services to address balance impairments to reduce falls risk.    OBJECTIVE IMPAIRMENTS: Abnormal gait, decreased balance, decreased cognition, decreased coordination, decreased mobility, difficulty walking, and decreased strength.   ACTIVITY LIMITATIONS: carrying, lifting, and stairs  PARTICIPATION LIMITATIONS: cleaning, laundry, shopping, community activity, and yard work  PERSONAL FACTORS: 1 comorbidity: dementia  are also affecting patient's functional outcome.   REHAB POTENTIAL: Good  CLINICAL DECISION MAKING: Evolving/moderate complexity  EVALUATION COMPLEXITY: Low  PLAN:  PT FREQUENCY: 1x/week  PT DURATION: 12 weeks  PLANNED INTERVENTIONS: Therapeutic exercises, Therapeutic activity, Neuromuscular re-education, Balance training, Gait training, Patient/Family education, Self Care, Joint mobilization, Stair training, Vestibular training, Canalith repositioning, DME instructions, Cryotherapy, Moist heat, and Manual therapy  PLAN FOR NEXT SESSION: Continue with LE strengthening and balance training as appropriate.  higher level balance activities next visit.     Louis Meckel, PT Physical Therapist- [facility]  12/06/2022, 12:52 PM

## 2022-12-06 NOTE — Progress Notes (Signed)
PATIENT NAME: Jeanette Newton DOB: 1928/07/17 MRN: [id number]  PRIMARY CARE PROVIDER: Danella Penton, MD  RESPONSIBLE PARTY:  Acct ID - Guarantor Home Phone Work Phone Relationship Acct Type  [id number] - Sicard,ER* [phone]  Self P/F     [address], [place], [place] [address]   Home visit completed with patient and daughter Albin Felling.  Appetite:  Continues to eat a banana muffin with tea or water in the mornings.Eating 3 meals a day but small portion.   Will obtain weight on next visit if not already done on provider visit.  Daughter feels patient's appetite has declined slightly.   Dementia: Daughter notes worsening short term memory.  Unable to recall family names but recognizes faces.  Pleasant and cooperative throughout visit.   Follow up with neurology this week.   Edema: Using diuretic about 3 x weekly now due to lower extremity edema.  No edema today.   No issues with shortness of breath reported by daughter.   Fatigue:  Daughter is seeing an increase in fatigue.  Patient not running errands with daughter as much as she previously was.  She is reporting feeling tired more often.   Mostly going to PT session, MD appointments and out to eat on occasion.   Mobility: Ambulating without assistive device.  Notes her left knee is weaker.  Daughter is considering using a cane when leg feels weaker.  Encourage daughter to follow up with PT regarding use.   Resources:  Discussed Dementia resource book.  Daughter has reviewed and no concerns or questions at this time.  She also has read the 36 hour day book to help with patient's dx.     CODE STATUS: DNR ADVANCED DIRECTIVES: No MOST FORM: Yes PPS: 50%   PHYSICAL EXAM:   VITALS: Today's Vitals   12/06/22 1005  BP: 128/60  Pulse: 72  SpO2: 98%    LUNGS: clear to auscultation  CARDIAC: Cor RRR}  EXTREMITIES: - for edema SKIN: Skin color, texture, turgor normal. No rashes or lesions or mobility and turgor normal  NEURO:  positive for memory problems       Truitt Merle, RN

## 2022-12-11 ENCOUNTER — Ambulatory Visit: Payer: Medicare Other

## 2022-12-13 ENCOUNTER — Ambulatory Visit: Payer: Medicare Other

## 2022-12-13 DIAGNOSIS — M6281 Muscle weakness (generalized): Secondary | ICD-10-CM

## 2022-12-13 DIAGNOSIS — R269 Unspecified abnormalities of gait and mobility: Secondary | ICD-10-CM

## 2022-12-13 DIAGNOSIS — R278 Other lack of coordination: Secondary | ICD-10-CM

## 2022-12-13 DIAGNOSIS — R262 Difficulty in walking, not elsewhere classified: Secondary | ICD-10-CM

## 2022-12-13 NOTE — Therapy (Signed)
OUTPATIENT PHYSICAL THERAPY NEURO TREATMENT  Patient Name: Jeanette Newton MRN: [id number] DOB:1928/04/12, 89 y.o., female Today's Date: 12/13/2022   PCP: Dr. Bethann Punches REFERRING PROVIDER: Dr. Cristopher Peru  END OF SESSION:  PT End of Session - 12/13/22 0931     Visit Number 26    Number of Visits 36    Date for PT Re-Evaluation 02/19/23    Authorization Type [employer]    Authorization Time Period 09/06/22-11/29/22    Progress Note Due on Visit 30    PT Start Time 0932    PT Stop Time 1013    PT Time Calculation (min) 41 min    Equipment Utilized During Treatment Gait belt    Activity Tolerance Patient tolerated treatment well;No increased pain    Behavior During Therapy [facility] for tasks assessed/performed                   Past Medical History:  Diagnosis Date   Cancer 2005-2006   lymphoma   Hypothyroidism    Status post chemoradiation    lymphoma   Status post radiation therapy    Thyroid disease    Wears hearing aid in both ears    Past Surgical History:  Procedure Laterality Date   ABDOMINAL EXPLORATION SURGERY  2005-2006   Dr Michela Pitcher   CATARACT EXTRACTION [facility] Left 07/28/2019   Procedure: CATARACT EXTRACTION PHACO AND INTRAOCULAR LENS PLACEMENT (IOC) LEFT 7.83  00:53.4;  Surgeon: Galen Manila, MD;  Location: [facility];  Service: Ophthalmology;  Laterality: Left;   CATARACT EXTRACTION W/PHACO Right 08/25/2019   Procedure: CATARACT EXTRACTION PHACO AND INTRAOCULAR LENS PLACEMENT (IOC) RIGHT 6.04 00:41.0;  Surgeon: Galen Manila, MD;  Location: [facility];  Service: Ophthalmology;  Laterality: Right;   CHOLECYSTECTOMY N/A 05/25/2016   Procedure: LAPAROSCOPIC CHOLECYSTECTOMY WITH INTRAOPERATIVE CHOLANGIOGRAM;  Surgeon: Earline Mayotte, MD;  Location: [facility] OR[facility];  Service: General;  Laterality: N/A;   ELBOW FRACTURE SURGERY Right    EYE SURGERY     LAPAROTOMY N/A 05/30/2019   Procedure: EXPLORATORY LAPAROTOMYand small bowel  resection;  Surgeon: Leafy Ro, MD;  Location: [facility];  Service: General;  Laterality: N/A;   XI ROBOTIC ASSISTED VENTRAL HERNIA N/A 11/06/2019   Procedure: XI ROBOTIC ASSISTED VENTRAL HERNIA REPAIR WITH MESH;  Surgeon: Leafy Ro, MD;  Location: [facility] OR[facility];  Service: General;  Laterality: N/A;   Patient Active Problem List   Diagnosis Date Noted   Bowel perforation 05/30/2019   Calculus of gallbladder with acute cholecystitis without obstruction 05/15/2016    ONSET DATE: Over 6 months  REFERRING DIAG: R26.89 (ICD-10-CM) - Imbalance   THERAPY DIAG:  Muscle weakness (generalized)  Difficulty in walking, not elsewhere classified  Abnormality of gait and mobility  Other lack of coordination  Rationale for Evaluation and Treatment: Rehabilitation  SUBJECTIVE:  SUBJECTIVE STATEMENT: Patient denies any pain or falls- reports was able to go outdoors and pick up some sticks in the yard.  Pt accompanied by: family member-[name]  PERTINENT HISTORY: Patient is a 89 year old female with Mixed Alzheimers and vascular dementia who lives alone yet daughter stays with her during the day. Reports does not drive and not happy about that yet has been independent with all ADLs- some imbalance however daughter denies any recent falls.   PAIN:  Are you having pain? No  PRECAUTIONS: Fall  WEIGHT BEARING RESTRICTIONS: No  FALLS: Has patient fallen in last 6 months? No    PATIENT GOALS: Caregiver goal- is to improve her stability and keep her safe in her home.2  OBJECTIVE:     Therex-   Seated cable- Knee flex-7.5 # 2 x 12 reps each LE Seated cable - Knee ext- 7.5# 2 x 12 reps each LE  Standing Hip abd- 2.5 # cable 2 x 12 reps each LE  Standing Hip ext - 2.5# cable 2 x 12 reps each LE   - Sit to stand  2 sets x 10 reps - Calf raises 2 sets x 10 reps   Pt educated throughout session about proper posture and technique with exercises. Improved exercise technique, movement at target joints, use of target  muscles after min to mod verbal, visual, tactile cues.    PATIENT EDUCATION: Education details: purpose of PT and how this service may assist in improving her balance Person educated: Patient, Child(ren), and [name] Education method: Explanation, Demonstration, Tactile cues, and Verbal cues Education comprehension: verbalized understanding, returned demonstration, verbal cues required, tactile cues required, and needs further education  HOME EXERCISE PROGRAM: Access Code: [email or web address] URL: [email or web address] Date: 09/11/2022 Prepared by: Maureen Ralphs  Exercises - Seated Hip Flexion March with Ankle Weights  - 1 x daily - 3 x weekly - 3 sets - 10 reps - Seated Hip Abduction with Resistance  - 1 x daily - 3 x weekly - 3 sets - 10 reps - Seated Hip Adduction Isometrics with Ball  - 1 x daily - 3 x weekly - 3 sets - 10 reps - Seated Long Arc Quad with Ankle Weight  - 1 x daily - 3 x weekly - 3 sets - 10 reps - Seated Hamstring Curl with Anchored Resistance  - 1 x daily - 3 x weekly - 3 sets - 10 reps - Seated Heel Raise  - 1 x daily - 3 x weekly - 3 sets - 10 reps - Seated Heel Toe Raises  - 1 x daily - 3 x weekly - 3 sets - 10 reps - Sit to Stand with Arms Crossed  - 1 x daily - 3 x weekly - 3 sets - 10 reps  GOALS: Goals reviewed with patient? Yes  SHORT TERM GOALS: Target date: 10/18/2022  Pt and caregiver (daughter) will be independent with HEP in order to improve strength and balance in order to decrease fall risk and improve function at home and work Baseline: EVAL- Patient has no formal HEP and admits to not exercising.  Goal status: INITIAL   LONG TERM GOALS: Target date: 02/19/2023  Pt will improve FOTO to target score  of > 57 to display perceived improvements in ability to complete ADL's.  Baseline: EVAL= 56; Will reassess next visit Goal status: ONGOING  2.  Pt will decrease 5TSTS by at least 3 seconds in order to demonstrate clinically  significant improvement in LE strength. Baseline: EVAL=18.89 sec without UE support; 10/09/2022= 13.70 sec without UE  Goal status: MET  3.  Pt will decrease TUG  to below 14 seconds/decrease in order to demonstrate decreased fall risk. Baseline: EVAL= 17.23 sec without UE support; 10/09/2022= 13.36 sec  Goal status: MET  4.  Pt will improve BERG by at least 3 points in order to demonstrate clinically significant improvement in balance.  Baseline: EVAL=45/56; 10/09/2022=51/56  Goal status: MET  5.  Patient will deny any falls during episode of care for optimal safety with all household and community distances  Baseline: EVAL= Unsteady yet no reported falls in last 6 months.  10/09/2022= No falls observed or reported per daughter. 11/13/2022= 1 fall since 10/09/2022; 11/27/2022= No further falls since last report on 11/13/2022. Goal status: ONGOING  6.  Pt will increase by at least 52m (17ft) in order to demonstrate clinically significant improvement in cardiopulmonary endurance and community ambulation             Baseline: 10/09/2022= 820; 11/13/2022= 1200 feet             Goal status: MET  7. Pt will improve FGA by at least 3 points in order to demonstrate clinically significant improvement in balance and decreased risk for falls. Baseline: 18/30; 11/27/2022= 20/30 Goal status: PROGRESSING   ASSESSMENT:  CLINICAL IMPRESSION: Treatment concentrated per plan of care with focus on LE strengthening. Patient was able to utilize cable system today for some resistance strengthening. She was able to follow VC to slow down and complete 2 sets of 12 with various LE exercises without major issues- fatigue as limiting factor with designed short rest breaks to allow for completion of therex. Pt will continue to benefit from skilled PT services to address balance impairments to reduce falls risk.    OBJECTIVE IMPAIRMENTS: Abnormal gait, decreased balance, decreased cognition, decreased coordination,  decreased mobility, difficulty walking, and decreased strength.   ACTIVITY LIMITATIONS: carrying, lifting, and stairs  PARTICIPATION LIMITATIONS: cleaning, laundry, shopping, community activity, and yard work  PERSONAL FACTORS: 1 comorbidity: dementia  are also affecting patient's functional outcome.   REHAB POTENTIAL: Good  CLINICAL DECISION MAKING: Evolving/moderate complexity  EVALUATION COMPLEXITY: Low  PLAN:  PT FREQUENCY: 1x/week  PT DURATION: 12 weeks  PLANNED INTERVENTIONS: Therapeutic exercises, Therapeutic activity, Neuromuscular re-education, Balance training, Gait training, Patient/Family education, Self Care, Joint mobilization, Stair training, Vestibular training, Canalith repositioning, DME instructions, Cryotherapy, Moist heat, and Manual therapy  PLAN FOR NEXT SESSION: Continue with LE strengthening and balance training as appropriate.  higher level balance activities next visit.     Louis Meckel, PT Physical Therapist- [facility]  12/13/2022, 11:31 AM

## 2022-12-18 ENCOUNTER — Ambulatory Visit: Payer: Medicare Other

## 2022-12-20 ENCOUNTER — Ambulatory Visit: Payer: Medicare Other

## 2022-12-20 DIAGNOSIS — M6281 Muscle weakness (generalized): Secondary | ICD-10-CM

## 2022-12-20 DIAGNOSIS — R262 Difficulty in walking, not elsewhere classified: Secondary | ICD-10-CM

## 2022-12-20 DIAGNOSIS — R41841 Cognitive communication deficit: Secondary | ICD-10-CM

## 2022-12-20 DIAGNOSIS — R278 Other lack of coordination: Secondary | ICD-10-CM

## 2022-12-20 DIAGNOSIS — R269 Unspecified abnormalities of gait and mobility: Secondary | ICD-10-CM

## 2022-12-20 NOTE — Therapy (Signed)
OUTPATIENT PHYSICAL THERAPY NEURO TREATMENT  Patient Name: Jeanette Newton MRN: [id number] DOB:02-Sep-1927, 89 y.o., female Today's Date: 12/20/2022   PCP: Dr. Bethann Punches REFERRING PROVIDER: Dr. Cristopher Peru  END OF SESSION:  PT End of Session - 12/20/22 1643     Visit Number 27    Number of Visits 36    Date for PT Re-Evaluation 02/19/23    Authorization Type [employer]    Authorization Time Period 09/06/22-11/29/22    Progress Note Due on Visit 30    PT Start Time 1602    PT Stop Time 1644    PT Time Calculation (min) 42 min    Equipment Utilized During Treatment Gait belt    Activity Tolerance Patient tolerated treatment well;No increased pain    Behavior During Therapy [facility] for tasks assessed/performed                   Past Medical History:  Diagnosis Date   Cancer 2005-2006   lymphoma   Hypothyroidism    Status post chemoradiation    lymphoma   Status post radiation therapy    Thyroid disease    Wears hearing aid in both ears    Past Surgical History:  Procedure Laterality Date   ABDOMINAL EXPLORATION SURGERY  2005-2006   Dr Michela Pitcher   CATARACT EXTRACTION [facility] Left 07/28/2019   Procedure: CATARACT EXTRACTION PHACO AND INTRAOCULAR LENS PLACEMENT (IOC) LEFT 7.83  00:53.4;  Surgeon: Galen Manila, MD;  Location: [facility];  Service: Ophthalmology;  Laterality: Left;   CATARACT EXTRACTION W/PHACO Right 08/25/2019   Procedure: CATARACT EXTRACTION PHACO AND INTRAOCULAR LENS PLACEMENT (IOC) RIGHT 6.04 00:41.0;  Surgeon: Galen Manila, MD;  Location: [facility];  Service: Ophthalmology;  Laterality: Right;   CHOLECYSTECTOMY N/A 05/25/2016   Procedure: LAPAROSCOPIC CHOLECYSTECTOMY WITH INTRAOPERATIVE CHOLANGIOGRAM;  Surgeon: Earline Mayotte, MD;  Location: [facility];  Service: General;  Laterality: N/A;   ELBOW FRACTURE SURGERY Right    EYE SURGERY     LAPAROTOMY N/A 05/30/2019   Procedure: EXPLORATORY LAPAROTOMYand small bowel  resection;  Surgeon: Leafy Ro, MD;  Location: [facility];  Service: General;  Laterality: N/A;   XI ROBOTIC ASSISTED VENTRAL HERNIA N/A 11/06/2019   Procedure: XI ROBOTIC ASSISTED VENTRAL HERNIA REPAIR WITH MESH;  Surgeon: Leafy Ro, MD;  Location: [facility];  Service: General;  Laterality: N/A;   Patient Active Problem List   Diagnosis Date Noted   Bowel perforation 05/30/2019   Calculus of gallbladder with acute cholecystitis without obstruction 05/15/2016    ONSET DATE: Over 6 months  REFERRING DIAG: R26.89 (ICD-10-CM) - Imbalance   THERAPY DIAG:  Muscle weakness (generalized)  Difficulty in walking, not elsewhere classified  Abnormality of gait and mobility  Other lack of coordination  Cognitive communication deficit  Rationale for Evaluation and Treatment: Rehabilitation  SUBJECTIVE:  SUBJECTIVE STATEMENT: Patient reports doing okay today- did report falling this week while getting out of daughters car. States no injury.   Pt accompanied by: family member-[name]  PERTINENT HISTORY: Patient is a 89 year old female with Mixed Alzheimers and vascular dementia who lives alone yet daughter stays with her during the day. Reports does not drive and not happy about that yet has been independent with all ADLs- some imbalance however daughter denies any recent falls.   PAIN:  Are you having pain? No  PRECAUTIONS: Fall  WEIGHT BEARING RESTRICTIONS: No  FALLS: Has patient fallen in last 6 months? No    PATIENT GOALS: Caregiver goal- is to improve her stability and keep her safe in her home.2  OBJECTIVE:     Therex-   -ambulate across stable and unstable surface outside. Negotiating changing surfaces from grass to sidewalk, across brick with turns and obstacles in pathway without  LOB. Patient ambulated up/down inclines, curbs, sidewalk, mulch, pine straw. Patient ambulated approx 32 min outdoors- mild unsteadiness at times yet no LOB.   Seated therex: Hip march Hip abd Knee ext Calf raises 1 set of 12 rep each LE  *patient fatigued upon completion  Pt educated throughout session about proper posture and technique with exercises. Improved exercise technique, movement at target joints, use of target muscles after min to mod verbal, visual, tactile cues.    PATIENT EDUCATION: Education details: purpose of PT and how this service may assist in improving her balance Person educated: Patient, Child(ren), and [name] Education method: Explanation, Demonstration, Tactile cues, and Verbal cues Education comprehension: verbalized understanding, returned demonstration, verbal cues required, tactile cues required, and needs further education  HOME EXERCISE PROGRAM: Access Code: [email or web address] URL: [email or web address] Date: 09/11/2022 Prepared by: Maureen Ralphs  Exercises - Seated Hip Flexion March with Ankle Weights  - 1 x daily - 3 x weekly - 3 sets - 10 reps - Seated Hip Abduction with Resistance  - 1 x daily - 3 x weekly - 3 sets - 10 reps - Seated Hip Adduction Isometrics with Ball  - 1 x daily - 3 x weekly - 3 sets - 10 reps - Seated Long Arc Quad with Ankle Weight  - 1 x daily - 3 x weekly - 3 sets - 10 reps - Seated Hamstring Curl with Anchored Resistance  - 1 x daily - 3 x weekly - 3 sets - 10 reps - Seated Heel Raise  - 1 x daily - 3 x weekly - 3 sets - 10 reps - Seated Heel Toe Raises  - 1 x daily - 3 x weekly - 3 sets - 10 reps - Sit to Stand with Arms Crossed  - 1 x daily - 3 x weekly - 3 sets - 10 reps  GOALS: Goals reviewed with patient? Yes  SHORT TERM GOALS: Target date: 10/18/2022  Pt and caregiver (daughter) will be independent with HEP in order to improve strength and balance in order to decrease fall risk and improve  function at home and work Baseline: EVAL- Patient has no formal HEP and admits to not exercising.  Goal status: INITIAL   LONG TERM GOALS: Target date: 02/19/2023  Pt will improve FOTO to target score  of > 57 to display perceived improvements in ability to complete ADL's.  Baseline: EVAL= 56; Will reassess next visit Goal status: ONGOING  2.  Pt will decrease 5TSTS by at least 3 seconds in order to demonstrate clinically significant improvement in LE  strength. Baseline: EVAL=18.89 sec without UE support; 10/09/2022= 13.70 sec without UE  Goal status: MET  3.  Pt will decrease TUG to below 14 seconds/decrease in order to demonstrate decreased fall risk. Baseline: EVAL= 17.23 sec without UE support; 10/09/2022= 13.36 sec  Goal status: MET  4.  Pt will improve BERG by at least 3 points in order to demonstrate clinically significant improvement in balance.  Baseline: EVAL=45/56; 10/09/2022=51/56  Goal status: MET  5.  Patient will deny any falls during episode of care for optimal safety with all household and community distances  Baseline: EVAL= Unsteady yet no reported falls in last 6 months.  10/09/2022= No falls observed or reported per daughter. 11/13/2022= 1 fall since 10/09/2022; 11/27/2022= No further falls since last report on 11/13/2022. Goal status: ONGOING  6.  Pt will increase by at least 22m (187ft) in order to demonstrate clinically significant improvement in cardiopulmonary endurance and community ambulation             Baseline: 10/09/2022= 820; 11/13/2022= 1200 feet             Goal status: MET  7. Pt will improve FGA by at least 3 points in order to demonstrate clinically significant improvement in balance and decreased risk for falls. Baseline: 18/30; 11/27/2022= 20/30 Goal status: PROGRESSING   ASSESSMENT:  CLINICAL IMPRESSION:   Treatment focused on outdoor mobility as patient reports she still gets outdoors and pulls weeds and walks some on unlevel surfaces.  Overall she performed well with some unsteadiness at times yet no LOB on compliant and non-compliant surfaces. She was mainly limited by fatigue.   Pt will continue to benefit from skilled PT services to address balance impairments to reduce falls risk.    OBJECTIVE IMPAIRMENTS: Abnormal gait, decreased balance, decreased cognition, decreased coordination, decreased mobility, difficulty walking, and decreased strength.   ACTIVITY LIMITATIONS: carrying, lifting, and stairs  PARTICIPATION LIMITATIONS: cleaning, laundry, shopping, community activity, and yard work  PERSONAL FACTORS: 1 comorbidity: dementia  are also affecting patient's functional outcome.   REHAB POTENTIAL: Good  CLINICAL DECISION MAKING: Evolving/moderate complexity  EVALUATION COMPLEXITY: Low  PLAN:  PT FREQUENCY: 1x/week  PT DURATION: 12 weeks  PLANNED INTERVENTIONS: Therapeutic exercises, Therapeutic activity, Neuromuscular re-education, Balance training, Gait training, Patient/Family education, Self Care, Joint mobilization, Stair training, Vestibular training, Canalith repositioning, DME instructions, Cryotherapy, Moist heat, and Manual therapy  PLAN FOR NEXT SESSION: Continue with LE strengthening and balance training as appropriate.  higher level balance activities next visit.     Louis Meckel, PT Physical Therapist- [facility]  12/20/2022, 4:47 PM

## 2022-12-25 ENCOUNTER — Ambulatory Visit: Payer: Medicare Other

## 2022-12-27 ENCOUNTER — Ambulatory Visit: Payer: Medicare Other | Attending: Neurology

## 2022-12-27 DIAGNOSIS — R41841 Cognitive communication deficit: Secondary | ICD-10-CM | POA: Diagnosis present

## 2022-12-27 DIAGNOSIS — R278 Other lack of coordination: Secondary | ICD-10-CM | POA: Insufficient documentation

## 2022-12-27 DIAGNOSIS — R269 Unspecified abnormalities of gait and mobility: Secondary | ICD-10-CM | POA: Diagnosis present

## 2022-12-27 DIAGNOSIS — R262 Difficulty in walking, not elsewhere classified: Secondary | ICD-10-CM | POA: Diagnosis present

## 2022-12-27 DIAGNOSIS — M6281 Muscle weakness (generalized): Secondary | ICD-10-CM | POA: Insufficient documentation

## 2022-12-27 NOTE — Therapy (Signed)
OUTPATIENT PHYSICAL THERAPY NEURO TREATMENT  Patient Name: Jeanette Newton MRN: [id number] DOB:[birth year], 89 y.o., female Today's Date: 12/27/2022   PCP: Dr. Bethann Punches REFERRING PROVIDER: Dr. Cristopher Peru  END OF SESSION:  PT End of Session - 12/27/22 0942     Visit Number 28    Number of Visits 36    Date for PT Re-Evaluation 02/19/23    Authorization Type [employer]    Authorization Time Period 09/06/22-11/29/22    Progress Note Due on Visit 30    PT Start Time 0935    PT Stop Time 1014    PT Time Calculation (min) 39 min    Equipment Utilized During Treatment Gait belt    Activity Tolerance Patient tolerated treatment well;No increased pain    Behavior During Therapy [facility] for tasks assessed/performed                   Past Medical History:  Diagnosis Date   Cancer (HCC) 2005-2006   lymphoma   Hypothyroidism    Status post chemoradiation    lymphoma   Status post radiation therapy    Thyroid disease    Wears hearing aid in both ears    Past Surgical History:  Procedure Laterality Date   ABDOMINAL EXPLORATION SURGERY  2005-2006   Dr Michela Pitcher   CATARACT EXTRACTION [facility] Left 07/28/2019   Procedure: CATARACT EXTRACTION PHACO AND INTRAOCULAR LENS PLACEMENT (IOC) LEFT 7.83  00:53.4;  Surgeon: Galen Manila, MD;  Location: [facility];  Service: Ophthalmology;  Laterality: Left;   CATARACT EXTRACTION W/PHACO Right 08/25/2019   Procedure: CATARACT EXTRACTION PHACO AND INTRAOCULAR LENS PLACEMENT (IOC) RIGHT 6.04 00:41.0;  Surgeon: Galen Manila, MD;  Location: [facility];  Service: Ophthalmology;  Laterality: Right;   CHOLECYSTECTOMY N/A 05/25/2016   Procedure: LAPAROSCOPIC CHOLECYSTECTOMY WITH INTRAOPERATIVE CHOLANGIOGRAM;  Surgeon: Earline Mayotte, MD;  Location: [facility];  Service: General;  Laterality: N/A;   ELBOW FRACTURE SURGERY Right    EYE SURGERY     LAPAROTOMY N/A 05/30/2019   Procedure: EXPLORATORY LAPAROTOMYand small bowel  resection;  Surgeon: Leafy Ro, MD;  Location: [facility];  Service: General;  Laterality: N/A;   XI ROBOTIC ASSISTED VENTRAL HERNIA N/A 11/06/2019   Procedure: XI ROBOTIC ASSISTED VENTRAL HERNIA REPAIR WITH MESH;  Surgeon: Leafy Ro, MD;  Location: [facility];  Service: General;  Laterality: N/A;   Patient Active Problem List   Diagnosis Date Noted   Bowel perforation (HCC) 05/30/2019   Calculus of gallbladder with acute cholecystitis without obstruction 05/15/2016    ONSET DATE: Over 6 months  REFERRING DIAG: R26.89 (ICD-10-CM) - Imbalance   THERAPY DIAG:  Muscle weakness (generalized)  Difficulty in walking, not elsewhere classified  Abnormality of gait and mobility  Other lack of coordination  Cognitive communication deficit  Rationale for Evaluation and Treatment: Rehabilitation  SUBJECTIVE:  SUBJECTIVE STATEMENT: Patient reports no further falls. Daughter reports patient still walking staggerly and unbalanced at times.   Pt accompanied by: family member-[name]  PERTINENT HISTORY: Patient is a 89 year old female with Mixed Alzheimers and vascular dementia who lives alone yet daughter stays with her during the day. Reports does not drive and not happy about that yet has been independent with all ADLs- some imbalance however daughter denies any recent falls.   PAIN:  Are you having pain? No  PRECAUTIONS: Fall  WEIGHT BEARING RESTRICTIONS: No  FALLS: Has patient fallen in last 6 months? No    PATIENT GOALS: Caregiver goal- is to improve her stability and keep her safe in her home.2  OBJECTIVE:     Therex-     Seated therex: using 4# AW each LE Hip march x 20 reps  Hip abd x 12 reps Knee ext x 15 reps Calf raises x 20 reps on 1/2 foam Ankle DF x 20 reps on 1/2  foam  Gait with 4WW trial- 1 min 8 sec for 150 feet Gait without AD- 1 min for 150 feet - mild intermittent unsteadiness yet no LOB   NMR:  Word game- at support bar- standing on airex pad in various feet position- Wide, narrowed, staggered and near tandem while arranging magnet letters in alphabetical order. (Unsteady with staggered and near tandem standing)  Dyanmic step march on airex pad without UE support x 20 reps each LE  Pt educated throughout session about proper posture and technique with exercises. Improved exercise technique, movement at target joints, use of target muscles after min to mod verbal, visual, tactile cues.    PATIENT EDUCATION: Education details: purpose of PT and how this service may assist in improving her balance Person educated: Patient, Child(ren), and [name] Education method: Explanation, Demonstration, Tactile cues, and Verbal cues Education comprehension: verbalized understanding, returned demonstration, verbal cues required, tactile cues required, and needs further education  HOME EXERCISE PROGRAM: Access Code: [email or web address] URL: [email or web address] Date: 09/11/2022 Prepared by: Maureen Ralphs  Exercises - Seated Hip Flexion March with Ankle Weights  - 1 x daily - 3 x weekly - 3 sets - 10 reps - Seated Hip Abduction with Resistance  - 1 x daily - 3 x weekly - 3 sets - 10 reps - Seated Hip Adduction Isometrics with Ball  - 1 x daily - 3 x weekly - 3 sets - 10 reps - Seated Long Arc Quad with Ankle Weight  - 1 x daily - 3 x weekly - 3 sets - 10 reps - Seated Hamstring Curl with Anchored Resistance  - 1 x daily - 3 x weekly - 3 sets - 10 reps - Seated Heel Raise  - 1 x daily - 3 x weekly - 3 sets - 10 reps - Seated Heel Toe Raises  - 1 x daily - 3 x weekly - 3 sets - 10 reps - Sit to Stand with Arms Crossed  - 1 x daily - 3 x weekly - 3 sets - 10 reps  GOALS: Goals reviewed with patient? Yes  SHORT TERM GOALS: Target date:  10/18/2022  Pt and caregiver (daughter) will be independent with HEP in order to improve strength and balance in order to decrease fall risk and improve function at home and work Baseline: EVAL- Patient has no formal HEP and admits to not exercising.  Goal status: INITIAL   LONG TERM GOALS: Target date: 02/19/2023  Pt will improve FOTO to target  score  of > 57 to display perceived improvements in ability to complete ADL's.  Baseline: EVAL= 56; Will reassess next visit Goal status: ONGOING  2.  Pt will decrease 5TSTS by at least 3 seconds in order to demonstrate clinically significant improvement in LE strength. Baseline: EVAL=18.89 sec without UE support; 10/09/2022= 13.70 sec without UE  Goal status: MET  3.  Pt will decrease TUG to below 14 seconds/decrease in order to demonstrate decreased fall risk. Baseline: EVAL= 17.23 sec without UE support; 10/09/2022= 13.36 sec  Goal status: MET  4.  Pt will improve BERG by at least 3 points in order to demonstrate clinically significant improvement in balance.  Baseline: EVAL=45/56; 10/09/2022=51/56  Goal status: MET  5.  Patient will deny any falls during episode of care for optimal safety with all household and community distances  Baseline: EVAL= Unsteady yet no reported falls in last 6 months.  10/09/2022= No falls observed or reported per daughter. 11/13/2022= 1 fall since 10/09/2022; 11/27/2022= No further falls since last report on 11/13/2022. Goal status: ONGOING  6.  Pt will increase by at least 76m (169ft) in order to demonstrate clinically significant improvement in cardiopulmonary endurance and community ambulation             Baseline: 10/09/2022= 820; 11/13/2022= 1200 feet             Goal status: MET  7. Pt will improve FGA by at least 3 points in order to demonstrate clinically significant improvement in balance and decreased risk for falls. Baseline: 18/30; 11/27/2022= 20/30 Goal status: PROGRESSING   ASSESSMENT:  CLINICAL  IMPRESSION:   Patient performed trial of 4WW today- she was not a fan- stated made her feel slow. Preferred walking without a device- yet some increased steadiness observed with walker. Mentioned to patient and daughter that this may be appropriate for longer outings to provide some rest and steadiness. Otherwise patient continues to progress with increased resistance today with therex without significant fatigue or difficulty.   Pt will continue to benefit from skilled PT services to address balance impairments to reduce falls risk.    OBJECTIVE IMPAIRMENTS: Abnormal gait, decreased balance, decreased cognition, decreased coordination, decreased mobility, difficulty walking, and decreased strength.   ACTIVITY LIMITATIONS: carrying, lifting, and stairs  PARTICIPATION LIMITATIONS: cleaning, laundry, shopping, community activity, and yard work  PERSONAL FACTORS: 1 comorbidity: dementia  are also affecting patient's functional outcome.   REHAB POTENTIAL: Good  CLINICAL DECISION MAKING: Evolving/moderate complexity  EVALUATION COMPLEXITY: Low  PLAN:  PT FREQUENCY: 1x/week  PT DURATION: 12 weeks  PLANNED INTERVENTIONS: Therapeutic exercises, Therapeutic activity, Neuromuscular re-education, Balance training, Gait training, Patient/Family education, Self Care, Joint mobilization, Stair training, Vestibular training, Canalith repositioning, DME instructions, Cryotherapy, Moist heat, and Manual therapy  PLAN FOR NEXT SESSION: Continue with LE strengthening and balance training as appropriate.  higher level balance activities next visit.     Louis Meckel, PT Physical Therapist-  [facility]  12/27/2022, 10:26 AM

## 2023-01-01 ENCOUNTER — Ambulatory Visit: Payer: Medicare Other

## 2023-01-02 ENCOUNTER — Telehealth: Payer: Self-pay

## 2023-01-02 ENCOUNTER — Other Ambulatory Visit: Payer: Medicare Other

## 2023-01-02 NOTE — Telephone Encounter (Signed)
[id number] Palliative Care Note  RN called and left voicemail apologizing for cancelled appt today and letting pt know that RN Manson Passey will call to reschedule future appt. Left contact info and request to return call if any issues  Barbette Merino, RN

## 2023-01-03 ENCOUNTER — Ambulatory Visit: Payer: Medicare Other

## 2023-01-03 DIAGNOSIS — R278 Other lack of coordination: Secondary | ICD-10-CM

## 2023-01-03 DIAGNOSIS — M6281 Muscle weakness (generalized): Secondary | ICD-10-CM

## 2023-01-03 DIAGNOSIS — R262 Difficulty in walking, not elsewhere classified: Secondary | ICD-10-CM

## 2023-01-03 DIAGNOSIS — R269 Unspecified abnormalities of gait and mobility: Secondary | ICD-10-CM

## 2023-01-03 DIAGNOSIS — R41841 Cognitive communication deficit: Secondary | ICD-10-CM

## 2023-01-03 NOTE — Therapy (Signed)
OUTPATIENT PHYSICAL THERAPY NEURO TREATMENT  Patient Name: Jeanette Newton MRN: [id number] DOB:Jan 13, 1928, 89 y.o., female Today's Date: 01/03/2023   PCP: Dr. Bethann Punches REFERRING PROVIDER: Dr. Cristopher Peru  END OF SESSION:  PT End of Session - 01/03/23 0935     Visit Number 29    Number of Visits 36    Date for PT Re-Evaluation 02/19/23    Authorization Type [employer]    Authorization Time Period 09/06/22-11/29/22    Progress Note Due on Visit 30    PT Start Time 0932    PT Stop Time 1007    PT Time Calculation (min) 35 min    Equipment Utilized During Treatment Gait belt    Activity Tolerance Patient tolerated treatment well;No increased pain    Behavior During Therapy [facility] for tasks assessed/performed                   Past Medical History:  Diagnosis Date   Cancer (HCC) 2005-2006   lymphoma   Hypothyroidism    Status post chemoradiation    lymphoma   Status post radiation therapy    Thyroid disease    Wears hearing aid in both ears    Past Surgical History:  Procedure Laterality Date   ABDOMINAL EXPLORATION SURGERY  2005-2006   Dr Michela Pitcher   CATARACT EXTRACTION [facility] Left 07/28/2019   Procedure: CATARACT EXTRACTION PHACO AND INTRAOCULAR LENS PLACEMENT (IOC) LEFT 7.83  00:53.4;  Surgeon: Galen Manila, MD;  Location: [facility];  Service: Ophthalmology;  Laterality: Left;   CATARACT EXTRACTION W/PHACO Right 08/25/2019   Procedure: CATARACT EXTRACTION PHACO AND INTRAOCULAR LENS PLACEMENT (IOC) RIGHT 6.04 00:41.0;  Surgeon: Galen Manila, MD;  Location: [facility];  Service: Ophthalmology;  Laterality: Right;   CHOLECYSTECTOMY N/A 05/25/2016   Procedure: LAPAROSCOPIC CHOLECYSTECTOMY WITH INTRAOPERATIVE CHOLANGIOGRAM;  Surgeon: Earline Mayotte, MD;  Location: [facility];  Service: General;  Laterality: N/A;   ELBOW FRACTURE SURGERY Right    EYE SURGERY     LAPAROTOMY N/A 05/30/2019   Procedure: EXPLORATORY LAPAROTOMYand small bowel  resection;  Surgeon: Leafy Ro, MD;  Location: [facility];  Service: General;  Laterality: N/A;   XI ROBOTIC ASSISTED VENTRAL HERNIA N/A 11/06/2019   Procedure: XI ROBOTIC ASSISTED VENTRAL HERNIA REPAIR WITH MESH;  Surgeon: Leafy Ro, MD;  Location: [facility];  Service: General;  Laterality: N/A;   Patient Active Problem List   Diagnosis Date Noted   Bowel perforation (HCC) 05/30/2019   Calculus of gallbladder with acute cholecystitis without obstruction 05/15/2016    ONSET DATE: Over 6 months  REFERRING DIAG: R26.89 (ICD-10-CM) - Imbalance   THERAPY DIAG:  Muscle weakness (generalized)  Difficulty in walking, not elsewhere classified  Abnormality of gait and mobility  Other lack of coordination  Cognitive communication deficit  Rationale for Evaluation and Treatment: Rehabilitation  SUBJECTIVE:  SUBJECTIVE STATEMENT: Patient reports doing well and no new issues  Pt accompanied by: family member-[name]  PERTINENT HISTORY: Patient is a 89 year old female with Mixed Alzheimers and vascular dementia who lives alone yet daughter stays with her during the day. Reports does not drive and not happy about that yet has been independent with all ADLs- some imbalance however daughter denies any recent falls.   PAIN:  Are you having pain? No  PRECAUTIONS: Fall  WEIGHT BEARING RESTRICTIONS: No  FALLS: Has patient fallen in last 6 months? No    PATIENT GOALS: Caregiver goal- is to improve her stability and keep her safe in her home.2  OBJECTIVE:     NMR:  Reviewed some basic Balance exercises that the patient could perform at home with assistance from daughter.   Standing at counter and performed the following:  Feet together- static with eyes open x 20 sec; eyes closed x 20 sec, head  turn/nod x 5 with eyes open; head turn with eyes closed x 5 Feet staggered-static with eyes open x 20 sec; eyes closed x 20 sec, head turn/nod x 5 with eyes open; head turn with eyes closed x 5 Feet tandem-static with eyes open x 20 sec; eyes closed x 20 sec, head turn/nod x 5 with eyes open; head turn with eyes closed x 5  Tandem forward gait with 1 UE support along counter - down and back x5 SLS - attempted each Leg- unable to hold > 4 sec each Side stepping along counter Dynamic high knee march x 20 reps without UE support.   Pt educated throughout session about proper posture and technique with exercises. Improved exercise technique, movement at target joints, use of target muscles after min to mod verbal, visual, tactile cues.    PATIENT EDUCATION: Education details: purpose of PT and how this service may assist in improving her balance Person educated: Patient, Child(ren), and [name] Education method: Explanation, Demonstration, Tactile cues, and Verbal cues Education comprehension: verbalized understanding, returned demonstration, verbal cues required, tactile cues required, and needs further education  HOME EXERCISE PROGRAM: Access Code: [email or web address] URL: [email or web address] Date: 09/11/2022 Prepared by: Maureen Ralphs  Exercises - Seated Hip Flexion March with Ankle Weights  - 1 x daily - 3 x weekly - 3 sets - 10 reps - Seated Hip Abduction with Resistance  - 1 x daily - 3 x weekly - 3 sets - 10 reps - Seated Hip Adduction Isometrics with Ball  - 1 x daily - 3 x weekly - 3 sets - 10 reps - Seated Long Arc Quad with Ankle Weight  - 1 x daily - 3 x weekly - 3 sets - 10 reps - Seated Hamstring Curl with Anchored Resistance  - 1 x daily - 3 x weekly - 3 sets - 10 reps - Seated Heel Raise  - 1 x daily - 3 x weekly - 3 sets - 10 reps - Seated Heel Toe Raises  - 1 x daily - 3 x weekly - 3 sets - 10 reps - Sit to Stand with Arms Crossed  - 1 x daily - 3 x  weekly - 3 sets - 10 reps  GOALS: Goals reviewed with patient? Yes  SHORT TERM GOALS: Target date: 10/18/2022  Pt and caregiver (daughter) will be independent with HEP in order to improve strength and balance in order to decrease fall risk and improve function at home and work Baseline: EVAL- Patient has no formal HEP and admits to  not exercising.  Goal status: INITIAL   LONG TERM GOALS: Target date: 02/19/2023  Pt will improve FOTO to target score  of > 57 to display perceived improvements in ability to complete ADL's.  Baseline: EVAL= 56; Will reassess next visit Goal status: ONGOING  2.  Pt will decrease 5TSTS by at least 3 seconds in order to demonstrate clinically significant improvement in LE strength. Baseline: EVAL=18.89 sec without UE support; 10/09/2022= 13.70 sec without UE  Goal status: MET  3.  Pt will decrease TUG to below 14 seconds/decrease in order to demonstrate decreased fall risk. Baseline: EVAL= 17.23 sec without UE support; 10/09/2022= 13.36 sec  Goal status: MET  4.  Pt will improve BERG by at least 3 points in order to demonstrate clinically significant improvement in balance.  Baseline: EVAL=45/56; 10/09/2022=51/56  Goal status: MET  5.  Patient will deny any falls during episode of care for optimal safety with all household and community distances  Baseline: EVAL= Unsteady yet no reported falls in last 6 months.  10/09/2022= No falls observed or reported per daughter. 11/13/2022= 1 fall since 10/09/2022; 11/27/2022= No further falls since last report on 11/13/2022. Goal status: ONGOING  6.  Pt will increase by at least 13m (162ft) in order to demonstrate clinically significant improvement in cardiopulmonary endurance and community ambulation             Baseline: 10/09/2022= 820; 11/13/2022= 1200 feet             Goal status: MET  7. Pt will improve FGA by at least 3 points in order to demonstrate clinically significant improvement in balance and decreased  risk for falls. Baseline: 18/30; 11/27/2022= 20/30 Goal status: PROGRESSING   ASSESSMENT:  CLINICAL IMPRESSION:   Patient performed well overall with review of basic balance exercises. She performed well- no significant LOB and patient and daughter report she should be able to perform at home without difficulty.  Patient has 1 remaining appointment to review all LE strengthening and plan to discharge with transition to home program.     OBJECTIVE IMPAIRMENTS: Abnormal gait, decreased balance, decreased cognition, decreased coordination, decreased mobility, difficulty walking, and decreased strength.   ACTIVITY LIMITATIONS: carrying, lifting, and stairs  PARTICIPATION LIMITATIONS: cleaning, laundry, shopping, community activity, and yard work  PERSONAL FACTORS: 1 comorbidity: dementia  are also affecting patient's functional outcome.   REHAB POTENTIAL: Good  CLINICAL DECISION MAKING: Evolving/moderate complexity  EVALUATION COMPLEXITY: Low  PLAN:  PT FREQUENCY: 1x/week  PT DURATION: 12 weeks  PLANNED INTERVENTIONS: Therapeutic exercises, Therapeutic activity, Neuromuscular re-education, Balance training, Gait training, Patient/Family education, Self Care, Joint mobilization, Stair training, Vestibular training, Canalith repositioning, DME instructions, Cryotherapy, Moist heat, and Manual therapy  PLAN FOR NEXT SESSION: Continue with LE strengthening and balance training as appropriate.  higher level balance activities next visit.     Louis Meckel, PT Physical Therapist- [facility]  01/03/2023, 3:48 PM

## 2023-01-08 ENCOUNTER — Ambulatory Visit: Payer: Medicare Other

## 2023-01-10 ENCOUNTER — Ambulatory Visit: Payer: Medicare Other

## 2023-01-10 DIAGNOSIS — M6281 Muscle weakness (generalized): Secondary | ICD-10-CM | POA: Diagnosis not present

## 2023-01-10 DIAGNOSIS — R41841 Cognitive communication deficit: Secondary | ICD-10-CM

## 2023-01-10 DIAGNOSIS — R278 Other lack of coordination: Secondary | ICD-10-CM

## 2023-01-10 DIAGNOSIS — R269 Unspecified abnormalities of gait and mobility: Secondary | ICD-10-CM

## 2023-01-10 DIAGNOSIS — R262 Difficulty in walking, not elsewhere classified: Secondary | ICD-10-CM

## 2023-01-10 NOTE — Therapy (Signed)
OUTPATIENT PHYSICAL THERAPY NEURO TREATMENT/DISCHARGE SUMMARY/Physical Therapy Progress Note   Dates of reporting period  11/13/2022   to   01/10/2023  Patient Name: Jeanette Newton MRN: [id number] DOB:1928/07/02, 89 y.o., female Today's Date: 01/10/2023   PCP: Dr. Bethann Punches REFERRING PROVIDER: Dr. Cristopher Peru  END OF SESSION:  PT End of Session - 01/10/23 0927     Visit Number 30    Number of Visits 36    Date for PT Re-Evaluation 02/19/23    Authorization Type [employer]    Authorization Time Period 09/06/22-11/29/22    Progress Note Due on Visit 30    PT Start Time 0930    Equipment Utilized During Treatment Gait belt    Activity Tolerance Patient tolerated treatment well;No increased pain    Behavior During Therapy [facility] for tasks assessed/performed                   Past Medical History:  Diagnosis Date   Cancer (HCC) 2005-2006   lymphoma   Hypothyroidism    Status post chemoradiation    lymphoma   Status post radiation therapy    Thyroid disease    Wears hearing aid in both ears    Past Surgical History:  Procedure Laterality Date   ABDOMINAL EXPLORATION SURGERY  2005-2006   Dr Michela Pitcher   CATARACT EXTRACTION [facility] Left 07/28/2019   Procedure: CATARACT EXTRACTION PHACO AND INTRAOCULAR LENS PLACEMENT (IOC) LEFT 7.83  00:53.4;  Surgeon: Galen Manila, MD;  Location: [facility];  Service: Ophthalmology;  Laterality: Left;   CATARACT EXTRACTION W/PHACO Right 08/25/2019   Procedure: CATARACT EXTRACTION PHACO AND INTRAOCULAR LENS PLACEMENT (IOC) RIGHT 6.04 00:41.0;  Surgeon: Galen Manila, MD;  Location: [facility];  Service: Ophthalmology;  Laterality: Right;   CHOLECYSTECTOMY N/A 05/25/2016   Procedure: LAPAROSCOPIC CHOLECYSTECTOMY WITH INTRAOPERATIVE CHOLANGIOGRAM;  Surgeon: Earline Mayotte, MD;  Location: [facility];  Service: General;  Laterality: N/A;   ELBOW FRACTURE SURGERY Right    EYE SURGERY     LAPAROTOMY N/A 05/30/2019    Procedure: EXPLORATORY LAPAROTOMYand small bowel resection;  Surgeon: Leafy Ro, MD;  Location: [facility] OR[facility];  Service: General;  Laterality: N/A;   XI ROBOTIC ASSISTED VENTRAL HERNIA N/A 11/06/2019   Procedure: XI ROBOTIC ASSISTED VENTRAL HERNIA REPAIR WITH MESH;  Surgeon: Leafy Ro, MD;  Location: [facility];  Service: General;  Laterality: N/A;   Patient Active Problem List   Diagnosis Date Noted   Bowel perforation (HCC) 05/30/2019   Calculus of gallbladder with acute cholecystitis without obstruction 05/15/2016    ONSET DATE: Over 6 months  REFERRING DIAG: R26.89 (ICD-10-CM) - Imbalance   THERAPY DIAG:  Muscle weakness (generalized)  Difficulty in walking, not elsewhere classified  Abnormality of gait and mobility  Other lack of coordination  Cognitive communication deficit  Rationale for Evaluation and Treatment: Rehabilitation  SUBJECTIVE:  SUBJECTIVE STATEMENT: Patient reports doing well and no new issues. States she is doing okay in her home and getting around well.   Pt accompanied by: family member-[name]  PERTINENT HISTORY: Patient is a 89 year old female with Mixed Alzheimers and vascular dementia who lives alone yet daughter stays with her during the day. Reports does not drive and not happy about that yet has been independent with all ADLs- some imbalance however daughter denies any recent falls.   PAIN:  Are you having pain? No  PRECAUTIONS: Fall  WEIGHT BEARING RESTRICTIONS: No  FALLS: Has patient fallen in last 6 months? No    PATIENT GOALS: Caregiver goal- is to improve her stability and keep her safe in her home.2  OBJECTIVE:   THEREX:   Review of home program to prepare for planned PT discharge today.  15-20 reps of: each LE - Standing hip march -  Standing hip abd - Standing hip ext - Standing knee flex - Standing minisquats - Standing calf raises (VC to perform slowly)   Physical therapy treatment session today consisted of completing assessment of goals and administration of testing as demonstrated and documented in flow sheet, treatment, and goals section of this note. Addition treatments may be found below.   FGA: 24/30  FOTO: 58       Pt educated throughout session about proper posture and technique with exercises. Improved exercise technique, movement at target joints, use of target muscles after min to mod verbal, visual, tactile cues.    PATIENT EDUCATION: Education details: purpose of PT and how this service may assist in improving her balance Person educated: Patient, Child(ren), and [name] Education method: Explanation, Demonstration, Tactile cues, and Verbal cues Education comprehension: verbalized understanding, returned demonstration, verbal cues required, tactile cues required, and needs further education  HOME EXERCISE PROGRAM: Access Code: [email or web address] URL: [email or web address] Date: 09/11/2022 Prepared by: Maureen Ralphs  Exercises - Seated Hip Flexion March with Ankle Weights  - 1 x daily - 3 x weekly - 3 sets - 10 reps - Seated Hip Abduction with Resistance  - 1 x daily - 3 x weekly - 3 sets - 10 reps - Seated Hip Adduction Isometrics with Ball  - 1 x daily - 3 x weekly - 3 sets - 10 reps - Seated Long Arc Quad with Ankle Weight  - 1 x daily - 3 x weekly - 3 sets - 10 reps - Seated Hamstring Curl with Anchored Resistance  - 1 x daily - 3 x weekly - 3 sets - 10 reps - Seated Heel Raise  - 1 x daily - 3 x weekly - 3 sets - 10 reps - Seated Heel Toe Raises  - 1 x daily - 3 x weekly - 3 sets - 10 reps - Sit to Stand with Arms Crossed  - 1 x daily - 3 x weekly - 3 sets - 10 reps  GOALS: Goals reviewed with patient? Yes  SHORT TERM GOALS: Target date: 10/18/2022  Pt and  caregiver (daughter) will be independent with HEP in order to improve strength and balance in order to decrease fall risk and improve function at home and work Baseline: EVAL- Patient has no formal HEP and admits to not exercising. 01/10/2023- Patient and daughter present with good working knowledge of HEP- seated/ standing/walking/balance Goal status: INITIAL   LONG TERM GOALS: Target date: 02/19/2023  Pt will improve FOTO to target score  of > 57 to display perceived improvements in ability to  complete ADL's.  Baseline: EVAL= 56; Will reassess next visit; 01/10/2023= 58 Goal status: MET  2.  Pt will decrease 5TSTS by at least 3 seconds in order to demonstrate clinically significant improvement in LE strength. Baseline: EVAL=18.89 sec without UE support; 10/09/2022= 13.70 sec without UE  Goal status: MET  3.  Pt will decrease TUG to below 14 seconds/decrease in order to demonstrate decreased fall risk. Baseline: EVAL= 17.23 sec without UE support; 10/09/2022= 13.36 sec  Goal status: MET  4.  Pt will improve BERG by at least 3 points in order to demonstrate clinically significant improvement in balance.  Baseline: EVAL=45/56; 10/09/2022=51/56  Goal status: MET  5.  Patient will deny any falls during episode of care for optimal safety with all household and community distances  Baseline: EVAL= Unsteady yet no reported falls in last 6 months.  10/09/2022= No falls observed or reported per daughter. 11/13/2022= 1 fall since 10/09/2022; 11/27/2022= No further falls since last report on 11/13/2022. 01/10/2023= no further falls reported Goal status: MET  6.  Pt will increase by at least 35m (144ft) in order to demonstrate clinically significant improvement in cardiopulmonary endurance and community ambulation             Baseline: 10/09/2022= 820; 11/13/2022= 1200 feet             Goal status: MET  7. Pt will improve FGA by at least 3 points in order to demonstrate clinically significant improvement  in balance and decreased risk for falls. Baseline: 18/30; 11/27/2022= 20/30; 01/10/2023= 24/30 Goal status: MET   ASSESSMENT:  CLINICAL IMPRESSION:  Patient presents with good working knowledge of HEP and no issues today. Overall she has progressed very well and today- she met the rest of her goals- FOTO, FGA, and ability to perform HEP. She has no recent falls and daughter reports other than continuing to stagger some she is doing well. She is appropriate today with all goals met and no need for any further skilled PT services at this time.       OBJECTIVE IMPAIRMENTS: Abnormal gait, decreased balance, decreased cognition, decreased coordination, decreased mobility, difficulty walking, and decreased strength.   ACTIVITY LIMITATIONS: carrying, lifting, and stairs  PARTICIPATION LIMITATIONS: cleaning, laundry, shopping, community activity, and yard work  PERSONAL FACTORS: 1 comorbidity: dementia  are also affecting patient's functional outcome.   REHAB POTENTIAL: Good  CLINICAL DECISION MAKING: Evolving/moderate complexity  EVALUATION COMPLEXITY: Low  PLAN:  PT FREQUENCY: 1x/week  PT DURATION: 12 weeks  PLANNED INTERVENTIONS: Therapeutic exercises, Therapeutic activity, Neuromuscular re-education, Balance training, Gait training, Patient/Family education, Self Care, Joint mobilization, Stair training, Vestibular training, Canalith repositioning, DME instructions, Cryotherapy, Moist heat, and Manual therapy  PLAN FOR NEXT SESSION: Discharge today with plan for patient/daughter to continue HEP as prescribed    Louis Meckel, PT Physical Therapist- [facility]  [facility]  01/10/2023, 9:28 AM

## 2023-01-15 ENCOUNTER — Ambulatory Visit: Payer: Medicare Other

## 2023-01-17 ENCOUNTER — Ambulatory Visit: Payer: Medicare Other

## 2023-01-22 ENCOUNTER — Ambulatory Visit: Payer: Medicare Other

## 2023-01-24 ENCOUNTER — Ambulatory Visit: Payer: Medicare Other

## 2023-01-29 ENCOUNTER — Ambulatory Visit: Payer: Medicare Other

## 2023-01-31 ENCOUNTER — Ambulatory Visit: Payer: Medicare Other

## 2023-02-05 ENCOUNTER — Ambulatory Visit: Payer: Medicare Other

## 2023-02-07 ENCOUNTER — Ambulatory Visit: Payer: Medicare Other

## 2023-02-12 ENCOUNTER — Ambulatory Visit: Payer: Medicare Other

## 2023-02-14 ENCOUNTER — Ambulatory Visit: Payer: Medicare Other

## 2023-02-19 ENCOUNTER — Ambulatory Visit: Payer: Medicare Other

## 2023-02-21 ENCOUNTER — Ambulatory Visit: Payer: Medicare Other

## 2023-02-26 ENCOUNTER — Ambulatory Visit: Payer: Medicare Other

## 2023-03-05 ENCOUNTER — Ambulatory Visit: Payer: Medicare Other

## 2023-03-07 ENCOUNTER — Ambulatory Visit: Payer: Medicare Other

## 2023-03-12 ENCOUNTER — Ambulatory Visit: Payer: Medicare Other

## 2023-03-14 ENCOUNTER — Ambulatory Visit: Payer: Medicare Other

## 2023-03-19 ENCOUNTER — Ambulatory Visit: Payer: Medicare Other

## 2023-03-21 ENCOUNTER — Ambulatory Visit: Payer: Medicare Other

## 2023-03-26 ENCOUNTER — Ambulatory Visit: Payer: Medicare Other

## 2023-03-28 ENCOUNTER — Ambulatory Visit: Payer: Medicare Other

## 2023-04-02 ENCOUNTER — Ambulatory Visit: Payer: Medicare Other

## 2023-04-04 ENCOUNTER — Ambulatory Visit: Payer: Medicare Other

## 2023-04-09 ENCOUNTER — Ambulatory Visit: Payer: Medicare Other

## 2023-04-11 ENCOUNTER — Ambulatory Visit: Payer: Medicare Other

## 2023-04-16 ENCOUNTER — Ambulatory Visit: Payer: Medicare Other

## 2023-04-18 ENCOUNTER — Ambulatory Visit: Payer: Medicare Other

## 2023-04-23 ENCOUNTER — Ambulatory Visit: Payer: Medicare Other

## 2023-04-25 ENCOUNTER — Ambulatory Visit: Payer: Medicare Other

## 2023-04-30 ENCOUNTER — Ambulatory Visit: Payer: Medicare Other

## 2023-05-02 ENCOUNTER — Ambulatory Visit: Payer: Medicare Other

## 2023-05-07 ENCOUNTER — Ambulatory Visit: Payer: Medicare Other

## 2023-05-09 ENCOUNTER — Ambulatory Visit: Payer: Medicare Other

## 2023-05-14 ENCOUNTER — Ambulatory Visit: Payer: Medicare Other

## 2023-05-16 ENCOUNTER — Ambulatory Visit: Payer: Medicare Other

## 2023-05-21 ENCOUNTER — Ambulatory Visit: Payer: Medicare Other

## 2023-05-23 ENCOUNTER — Ambulatory Visit: Payer: Medicare Other

## 2023-05-30 ENCOUNTER — Ambulatory Visit: Payer: Medicare Other

## 2023-06-06 ENCOUNTER — Ambulatory Visit: Payer: Medicare Other

## 2023-06-13 ENCOUNTER — Ambulatory Visit: Payer: Medicare Other

## 2023-06-20 ENCOUNTER — Ambulatory Visit: Payer: Medicare Other

## 2023-06-27 ENCOUNTER — Ambulatory Visit: Payer: Medicare Other

## 2023-07-04 ENCOUNTER — Ambulatory Visit: Payer: Medicare Other

## 2023-07-11 ENCOUNTER — Ambulatory Visit: Payer: Medicare Other

## 2023-07-18 ENCOUNTER — Ambulatory Visit: Payer: Medicare Other

## 2024-03-30 ENCOUNTER — Emergency Department

## 2024-03-30 ENCOUNTER — Emergency Department: Admission: EM | Admit: 2024-03-30 | Discharge: 2024-03-31 | Disposition: A | Discharge status: Home / Self Care

## 2024-03-30 ENCOUNTER — Other Ambulatory Visit: Payer: Self-pay

## 2024-03-30 DIAGNOSIS — W010XXA Fall on same level from slipping, tripping and stumbling without subsequent striking against object, initial encounter: Secondary | ICD-10-CM | POA: Insufficient documentation

## 2024-03-30 DIAGNOSIS — Y92000 Kitchen of unspecified non-institutional (private) residence as  the place of occurrence of the external cause: Secondary | ICD-10-CM | POA: Insufficient documentation

## 2024-03-30 DIAGNOSIS — W19XXXA Unspecified fall, initial encounter: Secondary | ICD-10-CM

## 2024-03-30 DIAGNOSIS — Z23 Encounter for immunization: Secondary | ICD-10-CM | POA: Insufficient documentation

## 2024-03-30 DIAGNOSIS — S81812A Laceration without foreign body, left lower leg, initial encounter: Secondary | ICD-10-CM | POA: Insufficient documentation

## 2024-03-30 DIAGNOSIS — N179 Acute kidney failure, unspecified: Secondary | ICD-10-CM | POA: Diagnosis not present

## 2024-03-30 DIAGNOSIS — F0390 Unspecified dementia without behavioral disturbance: Secondary | ICD-10-CM | POA: Insufficient documentation

## 2024-03-30 DIAGNOSIS — E039 Hypothyroidism, unspecified: Secondary | ICD-10-CM | POA: Insufficient documentation

## 2024-03-30 LAB — CBC WITH DIFFERENTIAL/PLATELET
Abs Granulocyte: 2.3 K/uL (ref 1.5–6.5)
Abs Immature Granulocytes: 0.01 K/uL (ref 0.00–0.07)
Basophils Absolute: 0 K/uL (ref 0.0–0.1)
Basophils Relative: 1 %
Eosinophils Absolute: 0.1 K/uL (ref 0.0–0.5)
Eosinophils Relative: 2 %
HCT: 37 % (ref 36.0–46.0)
Hemoglobin: 12 g/dL (ref 12.0–15.0)
Immature Granulocytes: 0 %
Lymphocytes Relative: 38 %
Lymphs Abs: 1.7 K/uL (ref 0.7–4.0)
MCH: 30.5 pg (ref 26.0–34.0)
MCHC: 32.4 g/dL (ref 30.0–36.0)
MCV: 93.9 fL (ref 80.0–100.0)
Monocytes Absolute: 0.4 K/uL (ref 0.1–1.0)
Monocytes Relative: 8 %
Neutro Abs: 2.3 K/uL (ref 1.7–7.7)
Neutrophils Relative %: 51 %
Platelets: 106 K/uL — ABNORMAL LOW (ref 150–400)
RBC: 3.94 MIL/uL (ref 3.87–5.11)
RDW: 14.1 % (ref 11.5–15.5)
WBC: 4.5 K/uL (ref 4.0–10.5)
nRBC: 0 % (ref 0.0–0.2)

## 2024-03-30 LAB — BASIC METABOLIC PANEL WITH GFR
Anion gap: 9 (ref 5–15)
BUN: 33 mg/dL — ABNORMAL HIGH (ref 8–23)
CO2: 24 mmol/L (ref 22–32)
Calcium: 9.2 mg/dL (ref 8.9–10.3)
Chloride: 107 mmol/L (ref 98–111)
Creatinine, Ser: 1.72 mg/dL — ABNORMAL HIGH (ref 0.44–1.00)
GFR, Estimated: 27 mL/min — ABNORMAL LOW (ref >60)
Glucose, Bld: 99 mg/dL (ref 70–99)
Potassium: 3.6 mmol/L (ref 3.5–5.1)
Sodium: 140 mmol/L (ref 135–145)

## 2024-03-30 MED ORDER — ACETAMINOPHEN 500 MG PO TABS
1000.0000 mg | ORAL_TABLET | Freq: Once | ORAL | Status: AC
  Administered 2024-03-30: 1000 mg via ORAL
  Filled 2024-03-30: qty 2

## 2024-03-30 MED ORDER — TETANUS-DIPHTH-ACELL PERTUSSIS 5-2.5-18.5 LF-MCG/0.5 IM SUSY
0.5000 mL | PREFILLED_SYRINGE | Freq: Once | INTRAMUSCULAR | Status: AC
  Administered 2024-03-30: 0.5 mL via INTRAMUSCULAR
  Filled 2024-03-30: qty 0.5

## 2024-03-30 MED ORDER — BACITRACIN ZINC 500 UNIT/GM EX OINT
TOPICAL_OINTMENT | Freq: Once | CUTANEOUS | Status: AC
  Filled 2024-03-30: qty 0.9

## 2024-03-30 MED ORDER — LIDOCAINE-EPINEPHRINE 2 %-1:100000 IJ SOLN
20.0000 mL | Freq: Once | INTRAMUSCULAR | Status: AC
  Administered 2024-03-30: 20 mL via INTRADERMAL
  Filled 2024-03-30: qty 1

## 2024-03-30 NOTE — ED Provider Notes (Signed)
 [facility] Provider Note    Event Date/Time   First MD Initiated Contact with Patient 03/30/24 1932     (approximate)   History   Fall  Pt is coming from home with medic, Medic states she fell in the kitchen, the fall was from standing. She is not on blood thinners. Pt does have a laceration on the left lower leg as well. She has no other complaints at this time. Unknown if the daughter was present when the fall happened but was on scene when medic arrived. NO BLOOD THINNERS   HPI Jeanette Newton is a 89 y.o. female PMH dementia, hypothyroidism, prior cholecystectomy presents for evaluation after a fall -Patient was in her kitchen with her daughter who is with her every day. Daughter notes that she seems to have tripped while turning the corner of the kitchen, attempted to stabilize herself on a chair but it fell with her, landed primarily on her bottom but rolled back.  Did not primarily hit her head, but it did ultimately end up on the floor she rolled back. - No recent infectious symptoms.  Does have some chronic urinary incontinence. -Is acting like her usual self.  No other recent falls. -Accompanied by daughter who is bedside and provides collateral -Was ambulatory with EMS after fall     Physical Exam   Triage Vital Signs: BP (!) 170/55   Pulse 71   Temp 98.1 F (36.7 C)   Resp 18   SpO2 100%    Most recent vital signs: Vitals:   03/30/24 1938  BP: (!) 170/55  Pulse: 71  Resp: 18  Temp: 98.1 F (36.7 C)  SpO2: 100%     General: Awake, no distress.  Neck:  No midline pain CV:  Good peripheral perfusion. RRR, RP 2+ Resp:  Normal effort. CTAB Abd:  No distention. Nontender to deep palpation throughout Other:  Full range of motion of all joints.  Does have a 9 cm laceration on the left distal medial shin through subcutaneous fat.  No exposed bone.  No gross foreign bodies appreciated.  Full flexor and extensor range of motion of toes  of foot.  Hemostatic.  Wound explored through full range of motion.   ED Results / Procedures / Treatments   Labs (all labs ordered are listed, but only abnormal results are displayed) Labs Reviewed  CBC WITH DIFFERENTIAL/PLATELET - Abnormal; Notable for the following components:      Result Value   Platelets 106 (*)    All other components within normal limits  BASIC METABOLIC PANEL WITH GFR - Abnormal; Notable for the following components:   BUN 33 (*)    Creatinine, Ser 1.72 (*)    GFR, Estimated 27 (*)    All other components within normal limits  URINALYSIS, COMPLETE (UACMP) WITH MICROSCOPIC     EKG  Ecg = sinus rhythm, rate 67, no gross ST elevation or depression, no significant repolarization abnormality, normal axis, normal intervals and first-degree AV block.  No clear evidence of ischemia no arrhythmia on my interpretation.   RADIOLOGY Radiology interpreted myself and radiology report is reviewed.  No acute pathology identified.    PROCEDURES:  Critical Care performed: No  .Laceration Repair  Date/Time: 03/30/2024 11:27 PM  Performed by: Clarine Ozell LABOR, MD Authorized by: Clarine Ozell LABOR, MD   Consent:    Consent obtained:  Verbal   Consent given by:  Patient   Risks, benefits, and alternatives were discussed:  yes     Risks discussed:  Infection, need for additional repair, nerve damage, poor cosmetic result, poor wound healing, pain, retained foreign body, tendon damage and vascular damage   Alternatives discussed:  No treatment and delayed treatment Universal protocol:    Procedure explained and questions answered to patient or proxy's satisfaction: yes     Test results available: yes     Imaging studies available: yes     Required blood products, implants, devices, and special equipment available: yes     Site/side marked: yes     Immediately prior to procedure, a time out was called: yes     Patient identity confirmed:  Verbally with patient and arm  band Anesthesia:    Anesthesia method:  Local infiltration   Local anesthetic:  Lidocaine  2% WITH epi Laceration details:    Location:  Leg   Leg location:  L lower leg   Length (cm):  9   Depth (mm):  20 Pre-procedure details:    Preparation:  Patient was prepped and draped in usual sterile fashion and imaging obtained to evaluate for foreign bodies Exploration:    Hemostasis achieved with:  Direct pressure   Imaging obtained: x-ray     Imaging outcome: foreign body not noted     Wound exploration: wound explored through full range of motion and entire depth of wound visualized     Wound extent: no foreign body, no signs of injury, no nerve damage, no tendon damage, no underlying fracture and no vascular damage     Contaminated: no   Treatment:    Area cleansed with:  Saline   Amount of cleaning:  Extensive   Irrigation solution:  Sterile saline   Irrigation volume:  500   Irrigation method:  Pressure wash   Visualized foreign bodies/material removed: no     Debridement:  Minimal   Undermining:  None   Scar revision: no     Layers/structures repaired:  Deep subcutaneous Deep subcutaneous:    Suture size:  5-0   Wound subcutaneous closure material used: absorbable.   Suture technique:  Simple interrupted   Number of sutures:  3 Skin repair:    Repair method:  Sutures   Suture size:  4-0   Suture material:  Nylon   Suture technique:  Simple interrupted   Number of sutures:  8 Approximation:    Approximation:  Close Repair type:    Repair type:  Intermediate Post-procedure details:    Dressing:  Antibiotic ointment and non-adherent dressing   Procedure completion:  Tolerated    MEDICATIONS ORDERED IN ED: Medications  bacitracin  ointment (has no administration in time range)  Tdap (BOOSTRIX ) injection 0.5 mL (0.5 mLs Intramuscular Given 03/30/24 2038)  acetaminophen  (TYLENOL ) tablet 1,000 mg (1,000 mg Oral Given 03/30/24 2037)  lidocaine -EPINEPHrine  (XYLOCAINE  W/EPI) 2  %-1:100000 (with pres) injection 20 mL (20 mLs Intradermal Given 03/30/24 2037)     IMPRESSION / MDM / ASSESSMENT AND PLAN / ED COURSE  I reviewed the triage vital signs and the nursing notes.                              DDX/MDM/AP: Differential diagnosis includes, but is not limited to, mechanical fall, consider underlying electrolyte abnormality or anemia or UTI contributing.  Does have obvious laceration to left lower extremity that is somewhat deep and will likely require layered closure, no obvious retained foreign bodies, no clear muscle involvement,  no clear neurovascular involvement.  Will rule out underlying fracture.  Considered but doubt pelvic or hip fracture.  Patient is 70 and did fall and seems to hit her head but had low velocity--given age will screen with CT head and neck to rule out intracranial hemorrhage, C-spine or skull fracture.  Plan: - Labs - CT head, CT C-spine - X-ray tib-fib, XR pelvis - Update Tdap (last in 2019 per chart review) - Wound irrigation, laceration repair - Tylenol  - EKG  Patient's presentation is most consistent with acute presentation with potential threat to life or bodily function.   ED course below.  Workup unremarkable with no evidence of traumatic injuries beyond laceration to left lower leg.  Laceration irrigated extensively and closed in layers given depth, fortunately no evidence of gross contamination.  Wound closed with 3 absorbable deep sutures and 8 nonabsorbable skin sutures --plan for skin suture removal in about 10 days.  Patient did not provide urine sample here, and shared decision making with patient's family preferred to defer straight catheterization given patient is otherwise acting like her usual self and not having any recent urinary symptoms--I believe this is very reasonable.  Will follow-up with PMD for reevaluation and suture removal.  Plan to encourage further oral hydration given mild AKI here.  Discharged home with  family.  ED return precautions in place.  Family agrees with plan.  Clinical Course as of 03/30/24 2330  Mon Mar 30, 2024  2135 XR L tib fib: IMPRESSION: No acute displaced fracture or dislocation.   [MM]  2135 XR pelvis: IMPRESSION: Negative for acute traumatic injury.   [MM]  2135 CTH, CTCSpine: IMPRESSION: 1. No acute intracranial abnormality. 2. No acute displaced fracture or traumatic listhesis of the cervical spine.   [MM]  2135 CBC at baseline except for mild thrombocytopenia, nonspecific [MM]  2136 BMP with mild bump in creatinine, otherwise unremarkable, will proceed with oral hydration [MM]    Clinical Course User Index [MM] Clarine Ozell LABOR, MD     FINAL CLINICAL IMPRESSION(S) / ED DIAGNOSES   Final diagnoses:  Fall, initial encounter  Laceration of left lower extremity, initial encounter  AKI (acute kidney injury) (HCC)     Rx / DC Orders   ED Discharge Orders     None        Note:  This document was prepared using Dragon voice recognition software and may include unintentional dictation errors.   Clarine Ozell LABOR, MD 03/30/24 2330

## 2024-03-30 NOTE — ED Triage Notes (Signed)
 Pt is coming from home with medic, Medic states she fell in the kitchen, the fall was from standing. She is not on blood thinners. Pt does have a laceration on the left lower leg as well. She has no other complaints at this time. Unknown if the daughter was present when the fall happened but was on scene when medic arrived. NO BLOOD THINNERS

## 2024-03-30 NOTE — Discharge Instructions (Addendum)
 Your mother's evaluation in the emergency department today was reassuring.  She did have a laceration to her left leg that was closed with 8 sutures that should be removed in about 10 days--he can follow-up with her primary care provider to have this done.  She did show evidence of mild dehydration as well, and I recommend encouraging good fluid intake in the meantime.  Return to the emergency department with any new or worsening symptoms.  Take care.
